# Patient Record
Sex: Male | Born: 1990 | Race: White | Hispanic: No | Smoking: Never smoker
Health system: Southern US, Community
[De-identification: ages and names within clinical notes are randomized; demographics above are authoritative.]

## PROBLEM LIST (undated history)

## (undated) DIAGNOSIS — E669 Obesity, unspecified: Secondary | ICD-10-CM

## (undated) DIAGNOSIS — T7840XA Allergy, unspecified, initial encounter: Secondary | ICD-10-CM

## (undated) DIAGNOSIS — F32A Depression, unspecified: Secondary | ICD-10-CM

## (undated) DIAGNOSIS — F419 Anxiety disorder, unspecified: Secondary | ICD-10-CM

## (undated) HISTORY — PX: TONSILLECTOMY: SUR1361

## (undated) HISTORY — PX: TYMPANOSTOMY TUBE PLACEMENT: SHX32

## (undated) HISTORY — DX: Obesity, unspecified: E66.9

## (undated) HISTORY — DX: Allergy, unspecified, initial encounter: T78.40XA

## (undated) HISTORY — DX: Depression, unspecified: F32.A

## (undated) HISTORY — DX: Anxiety disorder, unspecified: F41.9

---
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
## 2011-03-07 LAB — CBC
RBC: 5.28 MIL/uL (ref 3.80–5.70)
WBC: 7.9 10*3/uL (ref 4.5–13.5)

## 2011-11-16 ENCOUNTER — Emergency Department (HOSPITAL_COMMUNITY)
Admission: EM | Admit: 2011-11-16 | Discharge: 2011-11-17 | Disposition: A | Discharge status: Home / Self Care | Payer: Commercial Managed Care - PPO | Attending: Emergency Medicine | Admitting: Emergency Medicine

## 2011-11-16 DIAGNOSIS — J45901 Unspecified asthma with (acute) exacerbation: Secondary | ICD-10-CM | POA: Insufficient documentation

## 2011-11-17 ENCOUNTER — Encounter: Payer: Self-pay | Admitting: Emergency Medicine

## 2011-11-17 MED ORDER — ALBUTEROL SULFATE HFA 108 (90 BASE) MCG/ACT IN AERS
2.0000 | INHALATION_SPRAY | RESPIRATORY_TRACT | Status: DC | PRN
  Administered 2011-11-17: 2 via RESPIRATORY_TRACT
  Filled 2011-11-17: qty 6.7

## 2011-11-17 MED ORDER — IPRATROPIUM BROMIDE 0.02 % IN SOLN
RESPIRATORY_TRACT | Status: AC
  Administered 2011-11-17: 0.5 mg via RESPIRATORY_TRACT
  Filled 2011-11-17: qty 2.5

## 2011-11-17 MED ORDER — ALBUTEROL SULFATE (5 MG/ML) 0.5% IN NEBU
2.5000 mg | INHALATION_SOLUTION | Freq: Once | RESPIRATORY_TRACT | Status: AC
  Administered 2011-11-17: 2.5 mg via RESPIRATORY_TRACT

## 2011-11-17 MED ORDER — ALBUTEROL SULFATE (5 MG/ML) 0.5% IN NEBU
2.5000 mg | INHALATION_SOLUTION | Freq: Once | RESPIRATORY_TRACT | Status: AC
  Administered 2011-11-17: 2.5 mg via RESPIRATORY_TRACT
  Filled 2011-11-17: qty 1

## 2011-11-17 MED ORDER — IPRATROPIUM BROMIDE 0.02 % IN SOLN
0.5000 mg | Freq: Once | RESPIRATORY_TRACT | Status: DC
  Administered 2011-11-17: 0.5 mg via RESPIRATORY_TRACT

## 2011-11-17 NOTE — ED Notes (Signed)
No new rx given, pt given inhaler to take home. Voiced understanding to keep appt with PCP tomorrow

## 2011-11-17 NOTE — ED Notes (Signed)
Pt states he is feeling better.  

## 2011-11-17 NOTE — ED Provider Notes (Signed)
Medical screening examination/treatment/procedure(s) were performed by non-physician practitioner and as supervising physician I was immediately available for consultation/collaboration.   Kabella Cassidy M Curley Hogen, MD 11/17/11 0822 

## 2011-11-17 NOTE — ED Provider Notes (Signed)
History     CSN: [id number] Arrival date & time: 11/16/2011 11:57 PM   First MD Initiated Contact with Patient 11/17/11 0149      Chief Complaint  Patient presents with  . Asthma    (Consider location/radiation/quality/duration/timing/severity/associated sxs/prior treatment) HPI Comments: 20 year old gentleman with a history of asthma, who ran out of his inhalers, including Provera, and Dalara.  He does have an appointment tomorrow with Dr. Willa Rough to have his prescriptions refilled.  This asthma attack started several days ago.  He's tried rest and hydration without relief.  Exertion makes the wheezing worse  The history is provided by the patient.    Past Medical History  Diagnosis Date  . Asthma     History reviewed. No pertinent past surgical history.  No family history on file.  History  Substance Use Topics  . Smoking status: Never Smoker   . Smokeless tobacco: Not on file  . Alcohol Use: No      Review of Systems  Constitutional: Positive for activity change.  HENT: Negative.   Eyes: Negative.   Respiratory: Positive for chest tightness, shortness of breath and wheezing. Negative for cough.   Cardiovascular: Negative.   Gastrointestinal: Negative.   Genitourinary: Negative.   Musculoskeletal: Negative.   Neurological: Negative for dizziness.  Psychiatric/Behavioral: Negative.     Allergies  Penicillins  Home Medications   Current Outpatient Rx  Name Route Sig Dispense Refill  . ALBUTEROL SULFATE HFA 108 (90 BASE) MCG/ACT IN AERS Inhalation Inhale 2 puffs into the lungs every 6 (six) hours as needed. For shortness of breath     . MOMETASONE FURO-FORMOTEROL FUM 100-5 MCG/ACT IN AERO Inhalation Inhale 2 puffs into the lungs 2 (two) times daily.        BP 131/89  Pulse 113  Temp(Src) 97.6 F (36.4 C) (Oral)  Resp 20  SpO2 95%  Physical Exam  Constitutional: He is oriented to person, place, and time. He appears well-developed and well-nourished.    HENT:  Head: Normocephalic.  Eyes: EOM are normal.  Neck: Normal range of motion.  Cardiovascular: Tachycardia present.   Pulmonary/Chest: Effort normal. No accessory muscle usage. Not tachypneic. No respiratory distress.       I examined patient after he received neb treatment at triage no wheezing no distress   Musculoskeletal: Normal range of motion.  Neurological: He is oriented to person, place, and time.  Skin: Skin is warm and dry.    ED Course  Procedures (including critical care time)  Labs Reviewed - No data to display No results found.   1. Asthma attack     ON my exam after neb treatment no respiratory distress, no accessory muscle use HR 88, sat 98% RA   MDM  Asthma attack         Arman Filter, NP 11/17/11 0205  Arman Filter, NP 11/17/11 0206

## 2011-11-17 NOTE — ED Notes (Signed)
Pt out of asthma meds for 3 days.  C/o sob and wheezing.  Scheduled to see PCP tomorrow to get meds refilled.

## 2012-02-17 ENCOUNTER — Ambulatory Visit (INDEPENDENT_AMBULATORY_CARE_PROVIDER_SITE_OTHER): Payer: Commercial Managed Care - PPO | Admitting: Family Medicine

## 2012-02-17 VITALS — BP 124/82 | HR 89 | Temp 97.6°F | Resp 22 | Ht 75.0 in | Wt 322.0 lb

## 2012-02-17 DIAGNOSIS — F341 Dysthymic disorder: Secondary | ICD-10-CM

## 2012-02-17 DIAGNOSIS — F418 Other specified anxiety disorders: Secondary | ICD-10-CM

## 2012-02-17 MED ORDER — CITALOPRAM HYDROBROMIDE 20 MG PO TABS: 10.0000 mg | ORAL_TABLET | Freq: Every day | ORAL | Status: AC

## 2012-02-17 NOTE — Progress Notes (Signed)
[facility]:  Office Visit  Chief Complaint:  Chief Complaint  Patient presents with  . Anxiety    since Monday  . Panic Attack    since Monday  . Dizziness    lightheaded every evenings  . Migraine    since Sunday    HPI: Russell [name] is a 21 y.o. male who complains of  5 day history of anxiety, due to grandmothers death. He has HA, doesn't feel motivated. Sleeps 8 hours a day. Slight changes in sleep but not significant. No changes in appetite, mania, auditory or visual hallucination, suicidal thought or ideation, homicidal thoughts or ideation.   Past Medical History  Diagnosis Date  . Asthma   . Obesity    History reviewed. No pertinent past surgical history. History   Social History  . Marital Status: Single    Spouse Name: N/A    Number of Children: N/A  . Years of Education: N/A   Social History Main Topics  . Smoking status: Never Smoker   . Smokeless tobacco: None  . Alcohol Use: No  . Drug Use: No  . Sexually Active: None   Other Topics Concern  . None   Social History Narrative  . None   Family History  Problem Relation Age of Onset  . Anxiety disorder Mother   . Stroke Father   . Heart disease Maternal Grandfather    Allergies  Allergen Reactions  . Penicillins    Prior to Admission medications   Medication Sig Start Date End Date Taking? Authorizing Provider  albuterol (PROVENTIL HFA;VENTOLIN HFA) 108 (90 BASE) MCG/ACT inhaler Inhale 2 puffs into the lungs every 6 (six) hours as needed. For shortness of breath    Yes Historical Provider, MD  mometasone-formoterol (DULERA) 100-5 MCG/ACT AERO Inhale 2 puffs into the lungs 2 (two) times daily.     Yes Historical Provider, MD     ROS: The patient denies fevers, chills, night sweats, unintentional weight loss, chest pain, palpitations, wheezing, dyspnea on exertion, nausea, vomiting, abdominal pain, dysuria, hematuria, melena, numbness, weakness, or tingling. + anxiety,  depression  All other systems have been reviewed and were otherwise negative with the exception of those mentioned in the HPI and as above.    PHYSICAL EXAM: Filed Vitals:   02/17/12 1738  BP: 124/82  Pulse: 89  Temp: 97.6 F (36.4 C)  Resp: 22   Filed Vitals:   02/17/12 1738  Height: 6\' 3"  (1.905 m)  Weight: 322 lb (146.058 kg)   Body mass index is 40.25 kg/(m^2).  General: Alert, no acute distress, morbidly obese HEENT:  Normocephalic, atraumatic, oropharynx patent.  Cardiovascular:  Regular rate and rhythm, no rubs murmurs or gallops.  No Carotid bruits, radial pulse intact. No pedal edema.  Respiratory: Clear to auscultation bilaterally.  No wheezes, rales, or rhonchi.  No cyanosis, no use of accessory musculature GI: No organomegaly, abdomen is soft and non-tender, positive bowel sounds.  No masses. Skin: No rashes. Neurologic: Facial musculature symmetric. Psychiatric: Patient is appropriate throughout our interaction. Lymphatic: No cervical lymphadenopathy Musculoskeletal: Gait intact.   ASSESSMENT/PLAN: Encounter Diagnosis  Name Primary?  Marland Kitchen Anxiety associated with depression Yes   Zung Anxiety 49 Zung Depression 50 Patient was given an option of going to therapy and getting antianxiety medication but he opted to just take anti-depressant.  Recommend exercise, improve diet. Encourage him to get his sleep apnea study as scheduled.   F/u in 1 month   Knut Rondinelli  PHUONG, DO 02/17/2012 7:23 PM

## 2012-07-11 ENCOUNTER — Ambulatory Visit (INDEPENDENT_AMBULATORY_CARE_PROVIDER_SITE_OTHER): Payer: Commercial Managed Care - PPO | Admitting: Physician Assistant

## 2012-07-11 VITALS — BP 119/71 | HR 78 | Temp 98.2°F | Resp 18 | Ht 75.5 in | Wt 306.0 lb

## 2012-07-11 DIAGNOSIS — T6391XA Toxic effect of contact with unspecified venomous animal, accidental (unintentional), initial encounter: Secondary | ICD-10-CM

## 2012-07-11 DIAGNOSIS — T7840XA Allergy, unspecified, initial encounter: Secondary | ICD-10-CM

## 2012-07-11 DIAGNOSIS — J45909 Unspecified asthma, uncomplicated: Secondary | ICD-10-CM

## 2012-07-11 DIAGNOSIS — T63441A Toxic effect of venom of bees, accidental (unintentional), initial encounter: Secondary | ICD-10-CM

## 2012-07-11 MED ORDER — EPINEPHRINE 0.3 MG/0.3ML IJ DEVI: 0.3000 mg | Freq: Once | INTRAMUSCULAR | Status: DC

## 2012-07-11 MED ORDER — ALBUTEROL SULFATE HFA 108 (90 BASE) MCG/ACT IN AERS: 2.0000 | INHALATION_SPRAY | Freq: Four times a day (QID) | RESPIRATORY_TRACT | Status: DC | PRN

## 2012-07-11 MED ORDER — MOMETASONE FURO-FORMOTEROL FUM 100-5 MCG/ACT IN AERO: 2.0000 | INHALATION_SPRAY | Freq: Two times a day (BID) | RESPIRATORY_TRACT | Status: DC

## 2012-07-11 MED ORDER — PREDNISONE 20 MG PO TABS: ORAL_TABLET | ORAL | Status: AC

## 2012-07-11 NOTE — Progress Notes (Signed)
  Subjective:    Patient ID: Russell [name], male    DOB: 1991/02/10, 21 y.o.   MRN: [id number]  HPI Patient presents s/p yellow jacket sting at 7:00 this morning. The sting occurred on the dorsum of his right hand. He complains of a tickle in his throat but denies lip/tongue swelling or trouble breathing. No rash, hives, or pruritis. No history of anaphylactic reaction but he does have asthma and seasonal allergies. No known history of severe reaction to bee stings.  Has not taken any medications yet.  Has local swelling of his right hand with slight warmth and tenderness.     Review of Systems  All other systems reviewed and are negative.       Objective:   Physical Exam  Constitutional: He is oriented to person, place, and time. He appears well-developed and well-nourished.  HENT:  Head: Normocephalic and atraumatic.  Right Ear: External ear normal.  Left Ear: External ear normal.  Mouth/Throat: Oropharynx is clear and moist. No oropharyngeal exudate.  Eyes: Conjunctivae are normal.  Neck: Normal range of motion.  Cardiovascular: Normal rate, regular rhythm and normal heart sounds.   Pulmonary/Chest: Effort normal and breath sounds normal.  Musculoskeletal: Normal range of motion.       Right hand: He exhibits tenderness and swelling. normal sensation noted. Normal strength noted.  Neurological: He is alert and oriented to person, place, and time.  Skin: No rash noted.  Psychiatric: He has a normal mood and affect. His behavior is normal. Judgment and thought content normal.     Zantac 150 mg, Zyrtec 10 mg, and Benadryl 50 mg given.       Assessment & Plan:   1. Allergic reaction  predniSONE (DELTASONE) 20 MG tablet  2. Bee sting    3. Asthma  mometasone-formoterol (DULERA) 100-5 MCG/ACT AERO, albuterol (PROVENTIL HFA;VENTOLIN HFA) 108 (90 BASE) MCG/ACT inhaler   Reassurance provided. Will treat with Prednisone taper. Inhalers refilled and Epi-pen prescribed.   RTC  precautions given.  Recommend ice for local swelling.

## 2012-09-29 ENCOUNTER — Ambulatory Visit (INDEPENDENT_AMBULATORY_CARE_PROVIDER_SITE_OTHER): Payer: Commercial Managed Care - PPO | Admitting: Family Medicine

## 2012-09-29 VITALS — BP 120/88 | HR 88 | Temp 98.2°F | Resp 18 | Ht 75.75 in | Wt 303.8 lb

## 2012-09-29 DIAGNOSIS — H6980 Other specified disorders of Eustachian tube, unspecified ear: Secondary | ICD-10-CM

## 2012-09-29 DIAGNOSIS — G4730 Sleep apnea, unspecified: Secondary | ICD-10-CM

## 2012-09-29 DIAGNOSIS — J45909 Unspecified asthma, uncomplicated: Secondary | ICD-10-CM

## 2012-09-29 MED ORDER — FLUTICASONE PROPIONATE 50 MCG/ACT NA SUSP: 2.0000 | Freq: Every day | NASAL | Status: DC

## 2012-09-29 MED ORDER — CETIRIZINE HCL 10 MG PO TABS: 10.0000 mg | ORAL_TABLET | Freq: Every day | ORAL | Status: DC

## 2012-09-29 MED ORDER — MOMETASONE FURO-FORMOTEROL FUM 100-5 MCG/ACT IN AERO: 2.0000 | INHALATION_SPRAY | Freq: Two times a day (BID) | RESPIRATORY_TRACT | Status: DC

## 2012-09-29 NOTE — Patient Instructions (Addendum)
Try using debrox ear wax softener drops several days each month to prevent build up of ear wax  Barotitis Media Barotitis media is soreness (inflammation) of the area behind the eardrum (middle ear). This occurs when the auditory tube (Eustachian tube) leading from the back of the throat to the eardrum is blocked. When it is blocked air cannot move in and out of the middle ear to equalize pressure changes. These pressure changes come from changes in altitude when:  Flying.  Driving in the mountains.  Diving. Problems are more likely to occur with pressure changes during times when you are congested as from:  Hay fever.  Upper respiratory infection.  A cold. Damage or hearing loss (barotrauma) caused by this may be permanent. HOME CARE INSTRUCTIONS   Use medicines as recommended by your caregiver. Over the counter medicines will help unblock the canal and can help during times of air travel.  Do not put anything into your ears to clean or unplug them. Eardrops will not be helpful.  Do not swim, dive, or fly until your caregiver says it is all right to do so. If these activities are necessary, chewing gum with frequent swallowing may help. It is also helpful to hold your nose and gently blow to pop your ears for equalizing pressure changes. This forces air into the Eustachian tube.  For little ones with problems, give your baby a bottle of water or juice during periods when pressure changes would be anticipated such as during take offs and landings associated with air travel.  Only take over-the-counter or prescription medicines for pain, discomfort, or fever as directed by your caregiver.  A decongestant may be helpful in de-congesting the middle ear and make pressure equalization easier. This can be even more effective if the drops (spray) are delivered with the head lying over the edge of a bed with the head tilted toward the ear on the affected side.  If your caregiver has given you  a follow-up appointment, it is very important to keep that appointment. Not keeping the appointment could result in a chronic or permanent injury, pain, hearing loss and disability. If there is any problem keeping the appointment, you must call back to this facility for assistance. SEEK IMMEDIATE MEDICAL CARE IF:   You develop a severe headache, dizziness, severe ear pain, or bloody or pus-like drainage from your ears.  An oral temperature above 102 F (38.9 C) develops.  Your problems do not improve or become worse. MAKE SURE YOU:   Understand these instructions.  Will watch your condition.  Will get help right away if you are not doing well or get worse. Document Released: 12/02/2000 Document Revised: 02/27/2012 Document Reviewed: 07/10/2008 [facility] Patient Information 2013 [place], [place]. Sleep Apnea  Sleep apnea is a sleep disorder characterized by abnormal pauses in breathing while you sleep. When your breathing pauses, the level of oxygen in your blood decreases. This causes you to move out of deep sleep and into light sleep. As a result, your quality of sleep is poor, and the system that carries your blood throughout your body (cardiovascular system) experiences stress. If sleep apnea remains untreated, the following conditions can develop:  High blood pressure (hypertension).  Coronary artery disease.  Inability to achieve or maintain an erection (impotence).  Impairment of your thought process (cognitive dysfunction). There are three types of sleep apnea: 1. Obstructive sleep apnea Pauses in breathing during sleep because of a blocked airway. 2. Central sleep apnea Pauses in breathing  during sleep because the area of the brain that controls your breathing does not send the correct signals to the muscles that control breathing. 3. Mixed sleep apnea A combination of both obstructive and central sleep apnea. RISK FACTORS The following risk factors can increase your risk of  developing sleep apnea:  Being overweight.  Smoking.  Having narrow passages in your nose and throat.  Being of older age.  Being male.  Alcohol use.  Sedative and tranquilizer use.  Ethnicity. Among individuals younger than 35 years, African Americans are at increased risk of sleep apnea. SYMPTOMS   Difficulty staying asleep.  Daytime sleepiness and fatigue.  Loss of energy.  Irritability.  Loud, heavy snoring.  Morning headaches.  Trouble concentrating.  Forgetfulness.  Decreased interest in sex. DIAGNOSIS  In order to diagnose sleep apnea, your caregiver will perform a physical examination. Your caregiver may suggest that you take a home sleep test. Your caregiver may also recommend that you spend the night in a sleep lab. In the sleep lab, several monitors record information about your heart, lungs, and brain while you sleep. Your leg and arm movements and blood oxygen level are also recorded. TREATMENT The following actions may help to resolve mild sleep apnea:  Sleeping on your side.   Using a decongestant if you have nasal congestion.   Avoiding the use of depressants, including alcohol, sedatives, and narcotics.   Losing weight and modifying your diet if you are overweight. There also are devices and treatments to help open your airway:  Oral appliances. These are custom-made mouthpieces that shift your lower jaw forward and slightly open your bite. This opens your airway.  Devices that create positive airway pressure. This positive pressure "splints" your airway open to help you breathe better during sleep. The following devices create positive airway pressure:  Continuous positive airway pressure (CPAP) device. The CPAP device creates a continuous level of air pressure with an air pump. The air is delivered to your airway through a mask while you sleep. This continuous pressure keeps your airway open.  Nasal expiratory positive airway pressure  (EPAP) device. The EPAP device creates positive air pressure as you exhale. The device consists of single-use valves, which are inserted into each nostril and held in place by adhesive. The valves create very little resistance when you inhale but create much more resistance when you exhale. That increased resistance creates the positive airway pressure. This positive pressure while you exhale keeps your airway open, making it easier to breath when you inhale again.  Bilevel positive airway pressure (BPAP) device. The BPAP device is used mainly in patients with central sleep apnea. This device is similar to the CPAP device because it also uses an air pump to deliver continuous air pressure through a mask. However, with the BPAP machine, the pressure is set at two different levels. The pressure when you exhale is lower than the pressure when you inhale.  Surgery. Typically, surgery is only done if you cannot comply with less invasive treatments or if the less invasive treatments do not improve your condition. Surgery involves removing excess tissue in your airway to create a wider passage way. Document Released: 11/25/2002 Document Revised: 06/05/2012 Document Reviewed: 04/12/2012 [facility] Patient Information 2013 [place], [place].

## 2013-01-19 ENCOUNTER — Emergency Department (HOSPITAL_COMMUNITY)
Admission: EM | Admit: 2013-01-19 | Discharge: 2013-01-19 | Disposition: A | Discharge status: Home / Self Care | Payer: Commercial Managed Care - PPO | Attending: Emergency Medicine | Admitting: Emergency Medicine

## 2013-01-19 ENCOUNTER — Encounter (HOSPITAL_COMMUNITY): Payer: Self-pay | Admitting: Adult Health

## 2013-01-19 DIAGNOSIS — E669 Obesity, unspecified: Secondary | ICD-10-CM | POA: Insufficient documentation

## 2013-01-19 DIAGNOSIS — J3489 Other specified disorders of nose and nasal sinuses: Secondary | ICD-10-CM | POA: Insufficient documentation

## 2013-01-19 DIAGNOSIS — J45909 Unspecified asthma, uncomplicated: Secondary | ICD-10-CM | POA: Insufficient documentation

## 2013-01-19 DIAGNOSIS — H6690 Otitis media, unspecified, unspecified ear: Secondary | ICD-10-CM

## 2013-01-19 DIAGNOSIS — IMO0002 Reserved for concepts with insufficient information to code with codable children: Secondary | ICD-10-CM | POA: Insufficient documentation

## 2013-01-19 DIAGNOSIS — Z79899 Other long term (current) drug therapy: Secondary | ICD-10-CM | POA: Insufficient documentation

## 2013-01-19 MED ORDER — CEFDINIR 300 MG PO CAPS: 300.0000 mg | ORAL_CAPSULE | Freq: Two times a day (BID) | ORAL | Status: DC

## 2013-01-19 NOTE — ED Notes (Signed)
Pt c/o right ear pain onset earlier today

## 2013-01-19 NOTE — ED Notes (Signed)
Presents with right ear ache that began today. Right ear red.

## 2013-01-19 NOTE — ED Provider Notes (Signed)
History   This chart was scribed for Russell Emery PA-C a non-physician practitioner working with Ward Givens, MD by Lewanda Rife, ED Scribe. This patient was seen in room TR04C/TR04C and the patient's care was started at 7:50pm.    CSN: [id number]  Arrival date & time 01/19/13  1928   First MD Initiated Contact with Patient 01/19/13 1937      Chief Complaint  Patient presents with  . Otalgia    (Consider location/radiation/quality/duration/timing/severity/associated sxs/prior treatment) HPI OLSON LUCARELLI is a 22 y.o. male who presents to the Emergency Department complaining of constant moderate right ear pain onset this morning. Pt denies shortness of breath, fever, cough, and sore throat. Pt additionally reports congestion. Pt reports symptoms are aggravated by nothing and relieved by nothing. Pt denies taking any medications at home to treat pain. Pt reports history of recurrent ear infections.   Past Medical History  Diagnosis Date  . Asthma   . Obesity     History reviewed. No pertinent past surgical history.  Family History  Problem Relation Age of Onset  . Anxiety disorder Mother   . Stroke Father   . Heart disease Maternal Grandfather     History  Substance Use Topics  . Smoking status: Never Smoker   . Smokeless tobacco: Not on file  . Alcohol Use: No      Review of Systems  Constitutional: Negative.  Negative for fever.  HENT: Positive for ear pain and congestion. Negative for sore throat and ear discharge.   Respiratory: Negative.  Negative for shortness of breath.   Cardiovascular: Negative.   Gastrointestinal: Negative.   Musculoskeletal: Negative.   Skin: Negative.   Neurological: Negative.   Hematological: Negative.   Psychiatric/Behavioral: Negative.   All other systems reviewed and are negative.  A complete 10 system review of systems was obtained and all systems are negative except as noted in the HPI and PMH.     Allergies   Penicillins  Home Medications   Current Outpatient Rx  Name  Route  Sig  Dispense  Refill  . ALBUTEROL SULFATE HFA 108 (90 BASE) MCG/ACT IN AERS   Inhalation   Inhale 2 puffs into the lungs every 6 (six) hours as needed. For shortness of breath   1 Inhaler   5   . CETIRIZINE HCL 10 MG PO TABS   Oral   Take 1 tablet (10 mg total) by mouth daily.   30 tablet   11   . EPINEPHRINE 0.3 MG/0.3ML IJ DEVI   Intramuscular   Inject 0.3 mLs (0.3 mg total) into the muscle once.   1 Device   0   . FLUTICASONE PROPIONATE 50 MCG/ACT NA SUSP   Nasal   Place 2 sprays into the nose daily.   16 g   6   . MOMETASONE FURO-FORMOTEROL FUM 100-5 MCG/ACT IN AERO   Inhalation   Inhale 2 puffs into the lungs 2 (two) times daily.   1 Inhaler   5     There were no vitals taken for this visit.  Physical Exam  Nursing note and vitals reviewed. Constitutional: He is oriented to person, place, and time. He appears well-developed and well-nourished. No distress.  HENT:  Head: Normocephalic.  Right Ear: External ear normal. Tympanic membrane is bulging (mildly ).  Left Ear: Tympanic membrane, external ear and ear canal normal.       Right TM reduced light reflex   Eyes: Conjunctivae normal and EOM are  normal.  Neck: Neck supple.  Cardiovascular: Normal rate.   Pulmonary/Chest: Effort normal and breath sounds normal. No stridor. No respiratory distress. He has no wheezes. He has no rales.  Musculoskeletal: Normal range of motion.  Neurological: He is alert and oriented to person, place, and time.  Skin: Skin is warm and dry.  Psychiatric: He has a normal mood and affect. His behavior is normal.    ED Course  Procedures (including critical care time)  Labs Reviewed - No data to display No results found.   1. Otitis media       MDM   Right otitis media  Pt verbalized understanding and agrees with care plan. Outpatient follow-up and return precautions given.    New  Prescriptions   CEFDINIR (OMNICEF) 300 MG CAPSULE    Take 1 capsule (300 mg total) by mouth 2 (two) times daily.    I personally performed the services described in this documentation, which was scribed in my presence. The recorded information has been reviewed and is accurate.   Russell Emery, PA-C 01/25/13 [phone]

## 2013-01-21 ENCOUNTER — Ambulatory Visit (INDEPENDENT_AMBULATORY_CARE_PROVIDER_SITE_OTHER): Payer: Commercial Managed Care - PPO | Admitting: Physician Assistant

## 2013-01-21 VITALS — BP 116/68 | HR 90 | Temp 98.5°F | Resp 20 | Ht 75.75 in | Wt 316.8 lb

## 2013-01-21 DIAGNOSIS — H60399 Other infective otitis externa, unspecified ear: Secondary | ICD-10-CM

## 2013-01-21 DIAGNOSIS — H6990 Unspecified Eustachian tube disorder, unspecified ear: Secondary | ICD-10-CM

## 2013-01-21 DIAGNOSIS — H6980 Other specified disorders of Eustachian tube, unspecified ear: Secondary | ICD-10-CM

## 2013-01-21 DIAGNOSIS — H6090 Unspecified otitis externa, unspecified ear: Secondary | ICD-10-CM

## 2013-01-21 MED ORDER — CIPROFLOXACIN-DEXAMETHASONE 0.3-0.1 % OT SUSP: 4.0000 [drp] | Freq: Two times a day (BID) | OTIC | Status: DC

## 2013-01-21 MED ORDER — FLUTICASONE PROPIONATE 50 MCG/ACT NA SUSP: 2.0000 | Freq: Every day | NASAL | Status: DC

## 2013-01-21 MED ORDER — CETIRIZINE HCL 10 MG PO TABS: 10.0000 mg | ORAL_TABLET | Freq: Every day | ORAL | Status: DC

## 2013-01-21 NOTE — Progress Notes (Signed)
Subjective:    Patient ID: Russell [name], male    DOB: 1991-08-13, 21 y.o.   MRN: [id number]  HPI   Russell [name] is a 22 yr old male here complaining of bilateral ear pain over the last 2-3 days.  He was seen in the ED on 01/19/13, diagnosed with otitis media and started on cefdinir x 10 days.  Pt states today his ears are still hurting, now also having some pain in his jaw.   He is taking the abx as directed.  Taking 200-400mg  of ibuprofen twice daily with helps with the pain.  No difficulty swallowing.  States both ears continue to hurt.  Some runny nose and headache.  Sore throat in the mornings.  Denies fever, chills, nausea, vomiting.  History of asthma controlled with Dulera and albuterol.  Pt is not taking Flonase or Zyrtec which were previously prescribed for him.  Pt has been seen several times in the last 4 months for ear complaints, has never seen ENT.  States that he has had jaw pain in the past, was related to sleeping on his hands.  Stopped doing that and pain went away.  Denies clenching or grinding.  Pt does states that he "washes out" his ears daily in the shower with soap and water.  Denies use of cotton swabs.  Denies any drainage from the ears.    Review of Systems  Constitutional: Negative for fever and chills.  HENT: Positive for ear pain (bilat), sore throat, rhinorrhea and postnasal drip. Negative for hearing loss, facial swelling, neck pain, neck stiffness and ear discharge.   Respiratory: Positive for cough (asthma). Negative for shortness of breath and wheezing.   Cardiovascular: Negative.   Gastrointestinal: Negative.   Musculoskeletal: Negative.   Skin: Negative.   Neurological: Positive for headaches.       Objective:   Physical Exam  Vitals reviewed. Constitutional: He is oriented to person, place, and time. He appears well-developed and well-nourished. No distress.  HENT:  Head: Normocephalic and atraumatic.  Right Ear: Ear canal normal. Tympanic membrane is  erythematous and bulging.  Left Ear: Tympanic membrane normal.  Nose: Mucosal edema present.  Mouth/Throat: Uvula is midline, oropharynx is clear and moist and mucous membranes are normal.       Left tragus TTP; left canal erythematous with some maceration and debris present; no edema  Jaw nonTTP bilaterally; full ROM; no popping, clicking  Neck: Neck supple.  Cardiovascular: Normal rate, regular rhythm and normal heart sounds.   Pulmonary/Chest: Effort normal. He has no decreased breath sounds. He has wheezes (few, scattered, expiratory). He has no rhonchi. He has no rales.  Lymphadenopathy:    He has no cervical adenopathy.  Neurological: He is alert and oriented to person, place, and time.  Skin: Skin is warm and dry.  Psychiatric: He has a normal mood and affect. His behavior is normal.     Filed Vitals:   01/21/13 1546  BP: 116/68  Pulse: 90  Temp: 98.5 F (36.9 C)  Resp: 20        Assessment & Plan:   1. Otitis externa  ciprofloxacin-dexamethasone (CIPRODEX) otic suspension  2. Eustachian tube dysfunction  cetirizine (ZYRTEC) 10 MG tablet, fluticasone (FLONASE) 50 MCG/ACT nasal spray    Russell [name] is a 22 yr old male here with otalgia.  Diagnosed with OM at ED two days ago.  Currently taking cefdinir BID.  Suspect otitis externa of the left ear as well given erythema and tenderness  on palpation of the tragus.  Will try Ciprodex drops x 7 days.  Encouraged pt to keep ears dry as much as possible.  Discouraged him from washing out his ears in the shower.  Instructed him to finish the full course of oral antibiotics as prescribed.  Also encouraged him to restart both Flonase and Zyrtec as some of his symptoms are attributable to allergic rhinitis.  Also encouraged compliance with asthma regimen.  Ibuprofen for pain relief.  Pt expressed understanding.  Discussed RTC precautions.  If pt is not improved by the end of his antibiotic course, ENT referral may be the next step as he  has had ear symptoms since October.

## 2013-01-21 NOTE — Progress Notes (Signed)
Subjective:    Patient ID: Russell [name], male    DOB: [birth year], 21 y.o.   MRN: [id number] Chief Complaint  Patient presents with  . Cerumen Impaction    x 3 mth    HPI  Can't hear out of ear.   Past Medical History  Diagnosis Date  . Asthma   . Obesity    Current Outpatient Prescriptions on File Prior to Visit  Medication Sig Dispense Refill  . albuterol (PROVENTIL HFA;VENTOLIN HFA) 108 (90 BASE) MCG/ACT inhaler Inhale 2 puffs into the lungs every 6 (six) hours as needed. For shortness of breath  1 Inhaler  5  . EPINEPHrine (EPIPEN) 0.3 mg/0.3 mL DEVI Inject 0.3 mLs (0.3 mg total) into the muscle once.  1 Device  0  . mometasone-formoterol (DULERA) 100-5 MCG/ACT AERO Inhale 2 puffs into the lungs 2 (two) times daily.  1 Inhaler  5  . cetirizine (ZYRTEC) 10 MG tablet Take 1 tablet (10 mg total) by mouth daily.  30 tablet  11  . fluticasone (FLONASE) 50 MCG/ACT nasal spray Place 2 sprays into the nose daily.  16 g  6   Allergies  Allergen Reactions  . Penicillins     Unknown     Review of Systems  Constitutional: Positive for diaphoresis and fatigue. Negative for fever, chills, activity change, appetite change and unexpected weight change.  HENT: Positive for hearing loss, ear pain, congestion, rhinorrhea, sneezing and postnasal drip. Negative for sore throat, mouth sores, neck pain, neck stiffness and sinus pressure.   Respiratory: Positive for cough and wheezing. Negative for shortness of breath.   Cardiovascular: Negative for chest pain.  Gastrointestinal: Negative for nausea, vomiting, abdominal pain, diarrhea and constipation.  Genitourinary: Negative for dysuria.  Musculoskeletal: Negative for myalgias and arthralgias.  Skin: Negative for rash.  Neurological: Positive for headaches. Negative for syncope.  Hematological: Negative for adenopathy.  Psychiatric/Behavioral: Positive for sleep disturbance.      BP 120/88  Pulse 88  Temp 98.2 F (36.8 C) (Oral)   Resp 18  Ht 6' 3.75" (1.924 m)  Wt 303 lb 12.8 oz (137.803 kg)  BMI 37.22 kg/m2  SpO2 99% Objective:   Physical Exam  Constitutional: He is oriented to person, place, and time. He appears well-developed and well-nourished. No distress.  HENT:  Head: Normocephalic and atraumatic.  Right Ear: External ear and ear canal normal. Tympanic membrane is retracted. A middle ear effusion is present.  Left Ear: External ear and ear canal normal. Tympanic membrane is retracted. A middle ear effusion is present.  Nose: Mucosal edema and rhinorrhea present. Right sinus exhibits no maxillary sinus tenderness and no frontal sinus tenderness. Left sinus exhibits no maxillary sinus tenderness and no frontal sinus tenderness.  Mouth/Throat: Uvula is midline and mucous membranes are normal. Posterior oropharyngeal erythema present. No oropharyngeal exudate or posterior oropharyngeal edema.  Eyes: Conjunctivae normal are normal. Right eye exhibits no discharge. Left eye exhibits no discharge. No scleral icterus.  Neck: Normal range of motion. Neck supple. No thyromegaly present.  Cardiovascular: Normal rate, regular rhythm, normal heart sounds and intact distal pulses.   Pulmonary/Chest: Effort normal and breath sounds normal. No respiratory distress.  Lymphadenopathy:       Head (right side): No submandibular, no preauricular and no posterior auricular adenopathy present.       Head (left side): No submandibular, no preauricular and no posterior auricular adenopathy present.    He has no cervical adenopathy.  Right: No supraclavicular adenopathy present.       Left: No supraclavicular adenopathy present.  Neurological: He is alert and oriented to person, place, and time.  Skin: Skin is warm and dry. He is not diaphoretic. No erythema.  Psychiatric: He has a normal mood and affect. His behavior is normal.      Assessment & Plan:   1. Eustachian tube dysfunction  fluticasone (FLONASE) 50 MCG/ACT nasal  spray, cetirizine (ZYRTEC) 10 MG tablet  2. Asthma  mometasone-formoterol (DULERA) 100-5 MCG/ACT AERO  3. Sleep apnea  Ambulatory referral to Sleep Studies  Try using debrox ear wax softener drops several days each month to prevent build up of ear wax. Meds ordered this encounter  Medications  . fluticasone (FLONASE) 50 MCG/ACT nasal spray    Sig: Place 2 sprays into the nose daily.    Dispense:  16 g    Refill:  6  . mometasone-formoterol (DULERA) 100-5 MCG/ACT AERO    Sig: Inhale 2 puffs into the lungs 2 (two) times daily.    Dispense:  1 Inhaler    Refill:  5  . cetirizine (ZYRTEC) 10 MG tablet    Sig: Take 1 tablet (10 mg total) by mouth daily.    Dispense:  30 tablet    Refill:  11

## 2013-01-21 NOTE — Patient Instructions (Addendum)
Begin using the Ciprodex drops in your left ear as directed for 7 days.  Finish the oral antibiotics that you were given at the ED.  Let's try 600mg  ibuprofen every 8 hours (with food) as needed for pain.  If at the end of your antibiotic course you are still having ear pain or jaw pain, let us know.  Certainly, let us know if you feel like things are worsening before then.   Keep the ear dry as much as possible.  Restart both Flonase and Zyrtec as these will help your symptoms.   Otitis Externa Otitis externa is a bacterial or fungal infection of the outer ear canal. This is the area from the eardrum to the outside of the ear. Otitis externa is sometimes called "swimmer's ear." CAUSES  Possible causes of infection include:  Swimming in dirty water.  Moisture remaining in the ear after swimming or bathing.  Mild injury (trauma) to the ear.  Objects stuck in the ear (foreign body).  Cuts or scrapes (abrasions) on the outside of the ear. SYMPTOMS  The first symptom of infection is often itching in the ear canal. Later signs and symptoms may include swelling and redness of the ear canal, ear pain, and yellowish-white fluid (pus) coming from the ear. The ear pain may be worse when pulling on the earlobe. DIAGNOSIS  Your caregiver will perform a physical exam. A sample of fluid may be taken from the ear and examined for bacteria or fungi. TREATMENT  Antibiotic ear drops are often given for 10 to 14 days. Treatment may also include pain medicine or corticosteroids to reduce itching and swelling. PREVENTION   Keep your ear dry. Use the corner of a towel to absorb water out of the ear canal after swimming or bathing.  Avoid scratching or putting objects inside your ear. This can damage the ear canal or remove the protective wax that lines the canal. This makes it easier for bacteria and fungi to grow.  Avoid swimming in lakes, polluted water, or poorly chlorinated pools.  You may use ear drops  made of rubbing alcohol and vinegar after swimming. Combine equal parts of white vinegar and alcohol in a bottle. Put 3 or 4 drops into each ear after swimming. HOME CARE INSTRUCTIONS   Apply antibiotic ear drops to the ear canal as prescribed by your caregiver.  Only take over-the-counter or prescription medicines for pain, discomfort, or fever as directed by your caregiver.  If you have diabetes, follow any additional treatment instructions from your caregiver.  Keep all follow-up appointments as directed by your caregiver. SEEK MEDICAL CARE IF:   You have a fever.  Your ear is still red, swollen, painful, or draining pus after 3 days.  Your redness, swelling, or pain gets worse.  You have a severe headache.  You have redness, swelling, pain, or tenderness in the area behind your ear. MAKE SURE YOU:   Understand these instructions.  Will watch your condition.  Will get help right away if you are not doing well or get worse. Document Released: 12/05/2005 Document Revised: 02/27/2012 Document Reviewed: 12/22/2011 [facility] Patient Information 2013 [place], [place].

## 2013-01-26 NOTE — ED Provider Notes (Signed)
Medical screening examination/treatment/procedure(s) were performed by non-physician practitioner and as supervising physician I was immediately available for consultation/collaboration. Devoria Albe, MD, FACEP   Ward Givens, MD 01/26/13 [phone]

## 2013-05-07 ENCOUNTER — Telehealth: Payer: Self-pay

## 2013-05-07 NOTE — Telephone Encounter (Signed)
We have not seen him for his foot. He can come in for this here, or he can call ortho or podiatry and self schedule an appt, called him left message to advise.

## 2013-05-07 NOTE — Telephone Encounter (Signed)
Patient would like referral to foot doctor if possible [phone]

## 2013-06-24 ENCOUNTER — Ambulatory Visit (INDEPENDENT_AMBULATORY_CARE_PROVIDER_SITE_OTHER): Payer: Commercial Managed Care - PPO | Admitting: Physician Assistant

## 2013-06-24 VITALS — BP 130/80 | HR 74 | Temp 97.9°F | Resp 16 | Ht 75.5 in | Wt 315.0 lb

## 2013-06-24 DIAGNOSIS — H60391 Other infective otitis externa, right ear: Secondary | ICD-10-CM

## 2013-06-24 DIAGNOSIS — M549 Dorsalgia, unspecified: Secondary | ICD-10-CM

## 2013-06-24 DIAGNOSIS — H60399 Other infective otitis externa, unspecified ear: Secondary | ICD-10-CM

## 2013-06-24 MED ORDER — MELOXICAM 15 MG PO TABS
15.0000 mg | ORAL_TABLET | Freq: Every day | ORAL | Status: DC
Start: 2013-06-24 — End: 2013-09-24

## 2013-06-24 MED ORDER — CIPROFLOXACIN-DEXAMETHASONE 0.3-0.1 % OT SUSP: 4.0000 [drp] | Freq: Two times a day (BID) | OTIC | Status: DC

## 2013-06-24 NOTE — Progress Notes (Signed)
  Subjective:    Patient ID: Russell [name], male    DOB: 23-Jul-1991, 21 y.o.   MRN: [id number]  HPI 22 year old male presents with several complaints.   #1) Right ear pain x 1 day. Symptoms started yesterday and have progressively worsened. Was treated for an ear infection in 01/2013. Does admit this feels similarly to that.  Had a significant hx of ear infections as a child and has had several as an adult.  Does have slight nasal congestion, rhinorrhea, and dry cough secondary to allergies. Is not taking any medications for allergic rhinitis.  Denies fever, chills, nausea, vomiting, sinus pain, dizziness, or ear drainage.    #2)  Lower back pain x 2-3 months. 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  He states it is a dull, aching pain that gradually worsens throughout the day.  No acute trauma or injury. Does not have any hx of injuries or surgeries to his back.  Denies paresthesias, numbness/tingling in his legs, pain to his legs, weakness, or saddle anesthesia.  Has taken Goody's powder which does help relieve pain temporarily.  He does think that his being overweight has worsened this pain, and is interested in trying to start exercising again.      Review of Systems  Constitutional: Negative for fever and chills.  HENT: Positive for ear pain (right side), rhinorrhea and postnasal drip. Negative for congestion and sore throat.   Respiratory: Negative for cough.   Gastrointestinal: Negative for nausea and vomiting.  Musculoskeletal: Positive for back pain. Negative for gait problem.  Skin: Negative for rash.  Neurological: Negative for headaches.       Objective:   Physical Exam  Constitutional: He is oriented to person, place, and time. He appears well-developed and well-nourished.  HENT:  Head: Normocephalic and atraumatic.  Right Ear: Hearing, tympanic membrane and ear canal normal. There is drainage and tenderness (erythematous canal). Tympanic  membrane is not perforated and not erythematous.  Left Ear: Hearing, tympanic membrane, external ear and ear canal normal.  Eyes: Conjunctivae are normal.  Neck: Normal range of motion.  Cardiovascular: Normal rate, regular rhythm and normal heart sounds.   Pulmonary/Chest: Effort normal and breath sounds normal.  Neurological: He is alert and oriented to person, place, and time.  Psychiatric: He has a normal mood and affect. His behavior is normal. Judgment and thought content normal.          Assessment & Plan:  1. Otitis, externa, infective, right - Plan: ciprofloxacin-dexamethasone (CIPRODEX) otic suspension  -Early otitis externa  -Start Ciprodex drops 2. Back pain   - Plan: meloxicam (MOBIC) 15 MG tablet  -Start Mobic 15 mg daily with food  -Recommend exercise for 20-30 minutes daily at least 3 times a week  -If no improvement over the next 1-2 weeks, may consider PT   Follow up if symptoms worsen or fail to improve

## 2013-08-14 ENCOUNTER — Ambulatory Visit (INDEPENDENT_AMBULATORY_CARE_PROVIDER_SITE_OTHER): Payer: Commercial Managed Care - PPO | Admitting: Family Medicine

## 2013-08-14 VITALS — BP 140/78 | HR 82 | Temp 98.7°F | Resp 20 | Ht 77.0 in | Wt 315.0 lb

## 2013-08-14 DIAGNOSIS — J069 Acute upper respiratory infection, unspecified: Secondary | ICD-10-CM

## 2013-08-14 DIAGNOSIS — R42 Dizziness and giddiness: Secondary | ICD-10-CM

## 2013-08-14 DIAGNOSIS — R51 Headache: Secondary | ICD-10-CM

## 2013-08-14 DIAGNOSIS — R1084 Generalized abdominal pain: Secondary | ICD-10-CM

## 2013-08-14 MED ORDER — MECLIZINE HCL 25 MG PO TABS: 25.0000 mg | ORAL_TABLET | Freq: Three times a day (TID) | ORAL | Status: DC | PRN

## 2013-08-14 NOTE — Progress Notes (Signed)
Subjective:    Patient ID: Russell [name], male    DOB: 06-10-1991, 22 y.o.   MRN: [id number]  HPI Russell [name] is a 22 y.o. male PCP: Dr. Merla Riches.   Started with congestion. Pressure in ears. Head cold symptoms.  migraine ha started yesterday. Dizzy last night. Noticed this after pressure behind ears. Dizzy again today when getting into shower, drinking fluids ok, less appetite. No fever.   Lower abd pain on and off for a year, accompanied by bloody stools, but is constipated at the times of bloody stools. Did have bloody stool today, soft stool. Usually hard stools in past when had blood in stools. minimal stomach sx's - similar to ones.    Tx: Dayquil, nyquil. over  Hx of asthma - uses dulera twice per day. Few missed doses. No use of albuterol recently.   Hx of migraine HA, started with headache yesterday   Review of Systems  HENT: Negative for neck pain.   Eyes: Negative for visual disturbance (no phonophobia. ).  Gastrointestinal: Negative for nausea and vomiting.  Neurological: Positive for dizziness and headaches. Negative for facial asymmetry, speech difficulty and weakness.       Objective:   Physical Exam  Vitals reviewed. Constitutional: He is oriented to person, place, and time. He appears well-developed and well-nourished.  HENT:  Head: Normocephalic and atraumatic.  Right Ear: Tympanic membrane, external ear and ear canal normal. No middle ear effusion.  Left Ear: Tympanic membrane, external ear and ear canal normal.  No middle ear effusion.  Nose: No rhinorrhea.  Mouth/Throat: Oropharynx is clear and moist and mucous membranes are normal. No oropharyngeal exudate or posterior oropharyngeal erythema.  Eyes: Conjunctivae are normal. Pupils are equal, round, and reactive to light. Right eye exhibits nystagmus. Left eye exhibits nystagmus (1 beat horizontal nystagmus bilaterally. ).  Neck: Normal range of motion. Neck supple.  Cardiovascular: Normal rate,  regular rhythm, normal heart sounds and intact distal pulses.   No murmur heard. Pulmonary/Chest: Effort normal and breath sounds normal. He has no wheezes. He has no rhonchi. He has no rales.  Abdominal: Soft. Bowel sounds are normal. He exhibits no distension. There is tenderness (minimal epigastric). There is no rebound and no guarding.  Lymphadenopathy:    He has no cervical adenopathy.  Neurological: He is alert and oriented to person, place, and time. He has normal strength. No sensory deficit. He displays a negative Romberg sign.  Nonfocal.   Skin: Skin is warm and dry. No rash noted.  Psychiatric: He has a normal mood and affect. His behavior is normal.          Assessment & Plan:  Russell [name] is a 22 y.o. male Acute upper respiratory infections of unspecified site, with pressure ha, vs mild migraine.  Sx care - saline NS,  Sudafed or afrin - short course if needed. rtc precautions.   Dizziness - Plan: meclizine (ANTIVERT) 25 MG tablet for posssible etd/serous otitis, but did not appreciate much fluid on exam. rtc precautions.   Headache(784.0) - as above. nonfocal exam. Does not appear migranous at present.   Abdominal pain, generalized - intermittent and reported hx of bloody stools without prior workup.  May have constipation component, but will need to follow up with primary provider in next few weeks, or rtc sooner if not improving in next day or blood persists.   Patient Instructions  Saline nasal spray atleast 4 times per day, albuterol if needed for wheeze/asthma symptoms. (return  for recheck if needed more than 3 times per day or worsening) drink plenty of fluids.  Afrin nasal spray up to 3 days only or Sudafed for pressure headache. Follow up with primary provider to discuss abdominal pain, but if does not resolve tomorrow or blood remains - return to clinic for recheck in next 2 days.   Return to the clinic or go to the nearest emergency room if any of your  symptoms worsen or new symptoms occur. Upper Respiratory Infection, Adult An upper respiratory infection (URI) is also sometimes known as the common cold. The upper respiratory tract includes the nose, sinuses, throat, trachea, and bronchi. Bronchi are the airways leading to the lungs. Most people improve within 1 week, but symptoms can last up to 2 weeks. A residual cough may last even longer.  CAUSES Many different viruses can infect the tissues lining the upper respiratory tract. The tissues become irritated and inflamed and often become very moist. Mucus production is also common. A cold is contagious. You can easily spread the virus to others by oral contact. This includes kissing, sharing a glass, coughing, or sneezing. Touching your mouth or nose and then touching a surface, which is then touched by another person, can also spread the virus. SYMPTOMS  Symptoms typically develop 1 to 3 days after you come in contact with a cold virus. Symptoms vary from person to person. They may include:  Runny nose.  Sneezing.  Nasal congestion.  Sinus irritation.  Sore throat.  Loss of voice (laryngitis).  Cough.  Fatigue.  Muscle aches.  Loss of appetite.  Headache.  Low-grade fever. DIAGNOSIS  You might diagnose your own cold based on familiar symptoms, since most people get a cold 2 to 3 times a year. Your caregiver can confirm this based on your exam. Most importantly, your caregiver can check that your symptoms are not due to another disease such as strep throat, sinusitis, pneumonia, asthma, or epiglottitis. Blood tests, throat tests, and X-rays are not necessary to diagnose a common cold, but they may sometimes be helpful in excluding other more serious diseases. Your caregiver will decide if any further tests are required. RISKS AND COMPLICATIONS  You may be at risk for a more severe case of the common cold if you smoke cigarettes, have chronic heart disease (such as heart failure)  or lung disease (such as asthma), or if you have a weakened immune system. The very young and very old are also at risk for more serious infections. Bacterial sinusitis, middle ear infections, and bacterial pneumonia can complicate the common cold. The common cold can worsen asthma and chronic obstructive pulmonary disease (COPD). Sometimes, these complications can require emergency medical care and may be life-threatening. PREVENTION  The best way to protect against getting a cold is to practice good hygiene. Avoid oral or hand contact with people with cold symptoms. Wash your hands often if contact occurs. There is no clear evidence that vitamin C, vitamin E, echinacea, or exercise reduces the chance of developing a cold. However, it is always recommended to get plenty of rest and practice good nutrition. TREATMENT  Treatment is directed at relieving symptoms. There is no cure. Antibiotics are not effective, because the infection is caused by a virus, not by bacteria. Treatment may include:  Increased fluid intake. Sports drinks offer valuable electrolytes, sugars, and fluids.  Breathing heated mist or steam (vaporizer or shower).  Eating chicken soup or other clear broths, and maintaining good nutrition.  Getting plenty  of rest.  Using gargles or lozenges for comfort.  Controlling fevers with ibuprofen or acetaminophen as directed by your caregiver.  Increasing usage of your inhaler if you have asthma. Zinc gel and zinc lozenges, taken in the first 24 hours of the common cold, can shorten the duration and lessen the severity of symptoms. Pain medicines may help with fever, muscle aches, and throat pain. A variety of non-prescription medicines are available to treat congestion and runny nose. Your caregiver can make recommendations and may suggest nasal or lung inhalers for other symptoms.  HOME CARE INSTRUCTIONS   Only take over-the-counter or prescription medicines for pain, discomfort, or  fever as directed by your caregiver.  Use a warm mist humidifier or inhale steam from a shower to increase air moisture. This may keep secretions moist and make it easier to breathe.  Drink enough water and fluids to keep your urine clear or pale yellow.  Rest as needed.  Return to work when your temperature has returned to normal or as your caregiver advises. You may need to stay home longer to avoid infecting others. You can also use a face mask and careful hand washing to prevent spread of the virus. SEEK MEDICAL CARE IF:   After the first few days, you feel you are getting worse rather than better.  You need your caregiver's advice about medicines to control symptoms.  You develop chills, worsening shortness of breath, or brown or red sputum. These may be signs of pneumonia.  You develop yellow or brown nasal discharge or pain in the face, especially when you bend forward. These may be signs of sinusitis.  You develop a fever, swollen neck glands, pain with swallowing, or white areas in the back of your throat. These may be signs of strep throat. SEEK IMMEDIATE MEDICAL CARE IF:   You have a fever.  You develop severe or persistent headache, ear pain, sinus pain, or chest pain.  You develop wheezing, a prolonged cough, cough up blood, or have a change in your usual mucus (if you have chronic lung disease).  You develop sore muscles or a stiff neck. Document Released: 05/31/2001 Document Revised: 02/27/2012 Document Reviewed: 04/08/2011 [facility] Patient Information 2014 [place], [place].

## 2013-08-14 NOTE — Patient Instructions (Signed)
Saline nasal spray atleast 4 times per day, albuterol if needed for wheeze/asthma symptoms. (return for recheck if needed more than 3 times per day or worsening) drink plenty of fluids.  Afrin nasal spray up to 3 days only or Sudafed for pressure headache. Follow up with primary provider to discuss abdominal pain, but if does not resolve tomorrow or blood remains - return to clinic for recheck in next 2 days.   Return to the clinic or go to the nearest emergency room if any of your symptoms worsen or new symptoms occur. Upper Respiratory Infection, Adult An upper respiratory infection (URI) is also sometimes known as the common cold. The upper respiratory tract includes the nose, sinuses, throat, trachea, and bronchi. Bronchi are the airways leading to the lungs. Most people improve within 1 week, but symptoms can last up to 2 weeks. A residual cough may last even longer.  CAUSES Many different viruses can infect the tissues lining the upper respiratory tract. The tissues become irritated and inflamed and often become very moist. Mucus production is also common. A cold is contagious. You can easily spread the virus to others by oral contact. This includes kissing, sharing a glass, coughing, or sneezing. Touching your mouth or nose and then touching a surface, which is then touched by another person, can also spread the virus. SYMPTOMS  Symptoms typically develop 1 to 3 days after you come in contact with a cold virus. Symptoms vary from person to person. They may include:  Runny nose.  Sneezing.  Nasal congestion.  Sinus irritation.  Sore throat.  Loss of voice (laryngitis).  Cough.  Fatigue.  Muscle aches.  Loss of appetite.  Headache.  Low-grade fever. DIAGNOSIS  You might diagnose your own cold based on familiar symptoms, since most people get a cold 2 to 3 times a year. Your caregiver can confirm this based on your exam. Most importantly, your caregiver can check that your  symptoms are not due to another disease such as strep throat, sinusitis, pneumonia, asthma, or epiglottitis. Blood tests, throat tests, and X-rays are not necessary to diagnose a common cold, but they may sometimes be helpful in excluding other more serious diseases. Your caregiver will decide if any further tests are required. RISKS AND COMPLICATIONS  You may be at risk for a more severe case of the common cold if you smoke cigarettes, have chronic heart disease (such as heart failure) or lung disease (such as asthma), or if you have a weakened immune system. The very young and very old are also at risk for more serious infections. Bacterial sinusitis, middle ear infections, and bacterial pneumonia can complicate the common cold. The common cold can worsen asthma and chronic obstructive pulmonary disease (COPD). Sometimes, these complications can require emergency medical care and may be life-threatening. PREVENTION  The best way to protect against getting a cold is to practice good hygiene. Avoid oral or hand contact with people with cold symptoms. Wash your hands often if contact occurs. There is no clear evidence that vitamin C, vitamin E, echinacea, or exercise reduces the chance of developing a cold. However, it is always recommended to get plenty of rest and practice good nutrition. TREATMENT  Treatment is directed at relieving symptoms. There is no cure. Antibiotics are not effective, because the infection is caused by a virus, not by bacteria. Treatment may include:  Increased fluid intake. Sports drinks offer valuable electrolytes, sugars, and fluids.  Breathing heated mist or steam (vaporizer or shower).  Eating chicken soup or other clear broths, and maintaining good nutrition.  Getting plenty of rest.  Using gargles or lozenges for comfort.  Controlling fevers with ibuprofen or acetaminophen as directed by your caregiver.  Increasing usage of your inhaler if you have asthma. Zinc  gel and zinc lozenges, taken in the first 24 hours of the common cold, can shorten the duration and lessen the severity of symptoms. Pain medicines may help with fever, muscle aches, and throat pain. A variety of non-prescription medicines are available to treat congestion and runny nose. Your caregiver can make recommendations and may suggest nasal or lung inhalers for other symptoms.  HOME CARE INSTRUCTIONS   Only take over-the-counter or prescription medicines for pain, discomfort, or fever as directed by your caregiver.  Use a warm mist humidifier or inhale steam from a shower to increase air moisture. This may keep secretions moist and make it easier to breathe.  Drink enough water and fluids to keep your urine clear or pale yellow.  Rest as needed.  Return to work when your temperature has returned to normal or as your caregiver advises. You may need to stay home longer to avoid infecting others. You can also use a face mask and careful hand washing to prevent spread of the virus. SEEK MEDICAL CARE IF:   After the first few days, you feel you are getting worse rather than better.  You need your caregiver's advice about medicines to control symptoms.  You develop chills, worsening shortness of breath, or brown or red sputum. These may be signs of pneumonia.  You develop yellow or brown nasal discharge or pain in the face, especially when you bend forward. These may be signs of sinusitis.  You develop a fever, swollen neck glands, pain with swallowing, or white areas in the back of your throat. These may be signs of strep throat. SEEK IMMEDIATE MEDICAL CARE IF:   You have a fever.  You develop severe or persistent headache, ear pain, sinus pain, or chest pain.  You develop wheezing, a prolonged cough, cough up blood, or have a change in your usual mucus (if you have chronic lung disease).  You develop sore muscles or a stiff neck. Document Released: 05/31/2001 Document Revised:  02/27/2012 Document Reviewed: 04/08/2011 [facility] Patient Information 2014 [place], [place].

## 2013-08-15 ENCOUNTER — Other Ambulatory Visit: Payer: Self-pay | Admitting: Physician Assistant

## 2013-09-17 ENCOUNTER — Ambulatory Visit (INDEPENDENT_AMBULATORY_CARE_PROVIDER_SITE_OTHER): Payer: Commercial Managed Care - PPO | Admitting: Emergency Medicine

## 2013-09-17 VITALS — BP 112/80 | HR 80 | Temp 98.1°F | Resp 18 | Ht 77.0 in | Wt 317.0 lb

## 2013-09-17 DIAGNOSIS — IMO0002 Reserved for concepts with insufficient information to code with codable children: Secondary | ICD-10-CM

## 2013-09-17 DIAGNOSIS — M25572 Pain in left ankle and joints of left foot: Secondary | ICD-10-CM

## 2013-09-17 DIAGNOSIS — M25579 Pain in unspecified ankle and joints of unspecified foot: Secondary | ICD-10-CM

## 2013-09-17 DIAGNOSIS — L03012 Cellulitis of left finger: Secondary | ICD-10-CM

## 2013-09-17 MED ORDER — SULFAMETHOXAZOLE-TRIMETHOPRIM 800-160 MG PO TABS: 1.0000 | ORAL_TABLET | Freq: Two times a day (BID) | ORAL | Status: DC

## 2013-09-17 NOTE — Progress Notes (Signed)
[facility] [address] [place] [address] [id number] [phone]  Date:  09/17/2013   Name:  Russell [name]   DOB:  12/18/1991   MRN:  [id number]  PCP:  Tonye Pearson, MD    Chief Complaint: Foot Injury and Finger infected   History of Present Illness:  Russell [name] is a 22 y.o. very pleasant male patient who presents with the following:  Has painful swelling on lateral nail fold left third finger that he drained himself last night.  Now tender and red. No deformity.  Has a painful mass on the plantar left foot.  Is a patient of a podiatrist..  No improvement with over the counter medications or other home remedies. Denies other complaint or health concern today.   There are no active problems to display for this patient.   Past Medical History  Diagnosis Date  . Asthma   . Obesity   . Allergy     Past Surgical History  Procedure Laterality Date  . Tonsillectomy      History  Substance Use Topics  . Smoking status: Never Smoker   . Smokeless tobacco: Not on file  . Alcohol Use: No    Family History  Problem Relation Age of Onset  . Hypertension Mother   . Stroke Father   . Heart disease Maternal Grandfather     Allergies  Allergen Reactions  . Penicillins     Unknown    Medication list has been reviewed and updated.  Current Outpatient Prescriptions on File Prior to Visit  Medication Sig Dispense Refill  . albuterol (PROVENTIL HFA;VENTOLIN HFA) 108 (90 BASE) MCG/ACT inhaler Inhale 2 puffs into the lungs every 6 (six) hours as needed. For shortness of breath  1 Inhaler  5  . cefdinir (OMNICEF) 300 MG capsule Take 1 capsule (300 mg total) by mouth 2 (two) times daily.  20 capsule  0  . ciprofloxacin-dexamethasone (CIPRODEX) otic suspension Place 4 drops into the left ear 2 (two) times daily.  7.5 mL  0  . DULERA 100-5 MCG/ACT AERO INHALE 2 PUFFS INTO THE LUNGS 2 (TWO) TIMES DAILY.  13 g  1  . EPINEPHrine (EPIPEN) 0.3 mg/0.3 mL DEVI  Inject 0.3 mLs (0.3 mg total) into the muscle once.  1 Device  0  . fluticasone (FLONASE) 50 MCG/ACT nasal spray Place 2 sprays into the nose daily.  16 g  6  . meclizine (ANTIVERT) 25 MG tablet Take 1 tablet (25 mg total) by mouth 3 (three) times daily as needed for dizziness.  30 tablet  0  . meloxicam (MOBIC) 15 MG tablet Take 1 tablet (15 mg total) by mouth daily.  30 tablet  1  . mometasone-formoterol (DULERA) 100-5 MCG/ACT AERO Inhale 2 puffs into the lungs 2 (two) times daily.  1 Inhaler  5  . cetirizine (ZYRTEC) 10 MG tablet Take 1 tablet (10 mg total) by mouth daily.  30 tablet  11   No current facility-administered medications on file prior to visit.    Review of Systems:  As per HPI, otherwise negative.    Physical Examination: Filed Vitals:   09/17/13 1443  BP: 112/80  Pulse: 80  Temp: 98.1 F (36.7 C)  Resp: 18   Filed Vitals:   09/17/13 1443  Height: 6\' 5"  (1.956 m)  Weight: 317 lb (143.79 kg)   Body mass index is 37.58 kg/(m^2). Ideal Body Weight: Weight in (lb) to have BMI = 25: 210.4  GEN: WDWN, NAD, Non-toxic, Alert & Oriented x 3 HEENT: Atraumatic, Normocephalic.  Ears and Nose: No external deformity. EXTR: No clubbing/cyanosis/edema NEURO: Normal gait.  PSYCH: Normally interactive. Conversant. Not depressed or anxious appearing.  Calm demeanor.  LEFT hand:  Third finger paronychia.  Not ready to open LEFT Foot:  Tender mass on plantar surface of foot.    Assessment and Plan: Foot mass Paronychia Septra  Warm soaks Follow up with podiatrist   Signed,  Phillips Odor, MD

## 2013-09-17 NOTE — Patient Instructions (Addendum)
Paronychia Paronychia is an inflammatory reaction involving the folds of the skin surrounding the fingernail. This is commonly caused by an infection in the skin around a nail. The most common cause of paronychia is frequent wetting of the hands (as seen with bartenders, food servers, nurses or others who wet their hands). This makes the skin around the fingernail susceptible to infection by bacteria (germs) or fungus. Other predisposing factors are:  Aggressive manicuring.  Nail biting.  Thumb sucking. The most common cause is a staphylococcal (a type of germ) infection, or a fungal (Candida) infection. When caused by a germ, it usually comes on suddenly with redness, swelling, pus and is often painful. It may get under the nail and form an abscess (collection of pus), or form an abscess around the nail. If the nail itself is infected with a fungus, the treatment is usually prolonged and may require oral medicine for up to one year. Your caregiver will determine the length of time treatment is required. The paronychia caused by bacteria (germs) may largely be avoided by not pulling on hangnails or picking at cuticles. When the infection occurs at the tips of the finger it is called felon. When the cause of paronychia is from the herpes simplex virus (HSV) it is called herpetic whitlow. TREATMENT  When an abscess is present treatment is often incision and drainage. This means that the abscess must be cut open so the pus can get out. When this is done, the following home care instructions should be followed. HOME CARE INSTRUCTIONS   It is important to keep the affected fingers very dry. Rubber or plastic gloves over cotton gloves should be used whenever the hand must be placed in water.  Keep wound clean, dry and dressed as suggested by your caregiver between warm soaks or warm compresses.  Soak in warm water for fifteen to twenty minutes three to four times per day for bacterial infections. Fungal  infections are very difficult to treat, so often require treatment for long periods of time.  For bacterial (germ) infections take antibiotics (medicine which kill germs) as directed and finish the prescription, even if the problem appears to be solved before the medicine is gone.  Only take over-the-counter or prescription medicines for pain, discomfort, or fever as directed by your caregiver. SEEK IMMEDIATE MEDICAL CARE IF:  You have redness, swelling, or increasing pain in the wound.  You notice pus coming from the wound.  You have a fever.  You notice a bad smell coming from the wound or dressing. Document Released: 05/31/2001 Document Revised: 02/27/2012 Document Reviewed: 01/30/2009 [employer] Patient Information 2014 [employer].  

## 2013-09-24 ENCOUNTER — Other Ambulatory Visit: Payer: Self-pay | Admitting: Physician Assistant

## 2013-10-06 ENCOUNTER — Other Ambulatory Visit: Payer: Self-pay | Admitting: Physician Assistant

## 2013-10-07 ENCOUNTER — Other Ambulatory Visit: Payer: Self-pay | Admitting: Physician Assistant

## 2013-10-07 NOTE — Telephone Encounter (Signed)
Needs OV to discuss asthma medication and dosing

## 2013-11-02 ENCOUNTER — Ambulatory Visit (INDEPENDENT_AMBULATORY_CARE_PROVIDER_SITE_OTHER): Payer: Commercial Managed Care - PPO | Admitting: Emergency Medicine

## 2013-11-02 VITALS — BP 112/78 | HR 96 | Temp 98.8°F | Resp 18 | Ht 77.0 in | Wt 311.0 lb

## 2013-11-02 DIAGNOSIS — J45909 Unspecified asthma, uncomplicated: Secondary | ICD-10-CM

## 2013-11-02 MED ORDER — PREDNISONE (PAK) 10 MG PO TABS: ORAL_TABLET | Freq: Every day | ORAL | Status: DC

## 2013-11-02 MED ORDER — ALBUTEROL SULFATE (2.5 MG/3ML) 0.083% IN NEBU
2.5000 mg | INHALATION_SOLUTION | Freq: Once | RESPIRATORY_TRACT | Status: AC
  Administered 2013-11-02: 2.5 mg via RESPIRATORY_TRACT

## 2013-11-02 MED ORDER — MOMETASONE FURO-FORMOTEROL FUM 200-5 MCG/ACT IN AERO: INHALATION_SPRAY | RESPIRATORY_TRACT | Status: DC

## 2013-11-02 MED ORDER — IPRATROPIUM BROMIDE 0.02 % IN SOLN
0.5000 mg | Freq: Once | RESPIRATORY_TRACT | Status: AC
  Administered 2013-11-02: 0.5 mg via RESPIRATORY_TRACT

## 2013-11-02 MED ORDER — ALBUTEROL SULFATE HFA 108 (90 BASE) MCG/ACT IN AERS: 2.0000 | INHALATION_SPRAY | Freq: Four times a day (QID) | RESPIRATORY_TRACT | Status: DC | PRN

## 2013-11-02 NOTE — Progress Notes (Addendum)
This chart was scribed for Lesle Chris, MD by Joaquin Music, ED Scribe. This patient was seen in room Room/bed 9 and the patient's care was started at 6:19 PM. Subjective:    Patient ID: Russell [name], male    DOB: [birth year], 21 y.o.   MRN: [id number] Chief Complaint  Patient presents with  . Asthma    trouble breathing in morning, 1 week   HPI Russell [name] is a 22 y.o. male with a hx of asthma who presents to the [facility] complaining of SOB and severe asthma flare ups for the past week. Pt states his symptoms worsen in the morning when he wakes up. Pt states he has an inhaler and has been using his inhaler frequently due to his flare ups. Pt states he has several allergies. He states he at times has yellow sputum. Pt denies hx of smoking.  Pt took prednisone over 6 months ago. Pt takes Dolair on a daily basis. Pt state he no longer has proair.  Review of Systems  Respiratory: Positive for cough, shortness of breath and wheezing.    Objective:   Physical Exam CONSTITUTIONAL: Well developed/well nourished HEAD: Normocephalic/atraumatic EYES: EOMI/PERRL ENMT: Mucous membranes moist NECK: supple no meningeal signs SPINE:entire spine nontender CV: S1/S2 noted, no murmurs/rubs/gallops noted LUNGS: There are bilateral wheezes noted. They're worse on the left than the right. Breath sounds are symmetrical  ABDOMEN: soft, nontender, no rebound or guarding GU:no cva tenderness NEURO: Pt is awake/alert, moves all extremitiesx4 EXTREMITIES: pulses normal, full ROM SKIN: warm, color normal PSYCH: no abnormalities of mood noted  Triage Vitals:BP 112/78  Pulse 96  Temp(Src) 98.8 F (37.1 C)  Resp 18  Ht 6\' 5"  (1.956 m)  Wt 311 lb (141.069 kg)  BMI 36.87 kg/m2  SpO2 98% Assessment & Plan:  We'll treat with Dulera at the higher dose 2 puffs twice a day we'll give him a short taper of prednisone. His albuterol inhaler was also refilled.

## 2014-06-18 ENCOUNTER — Other Ambulatory Visit: Payer: Self-pay | Admitting: Emergency Medicine

## 2014-09-27 ENCOUNTER — Other Ambulatory Visit: Payer: Self-pay | Admitting: Physician Assistant

## 2014-11-01 ENCOUNTER — Ambulatory Visit (INDEPENDENT_AMBULATORY_CARE_PROVIDER_SITE_OTHER): Payer: Commercial Managed Care - PPO | Admitting: Internal Medicine

## 2014-11-01 VITALS — BP 121/74 | HR 89 | Temp 98.3°F | Resp 16 | Ht 75.5 in | Wt 295.4 lb

## 2014-11-01 DIAGNOSIS — H6532 Chronic mucoid otitis media, left ear: Secondary | ICD-10-CM

## 2014-11-01 DIAGNOSIS — J0100 Acute maxillary sinusitis, unspecified: Secondary | ICD-10-CM

## 2014-11-01 DIAGNOSIS — Z6836 Body mass index (BMI) 36.0-36.9, adult: Secondary | ICD-10-CM

## 2014-11-01 MED ORDER — CEFDINIR 300 MG PO CAPS: 300.0000 mg | ORAL_CAPSULE | Freq: Two times a day (BID) | ORAL | Status: DC

## 2014-11-01 MED ORDER — HYDROCODONE-HOMATROPINE 5-1.5 MG/5ML PO SYRP: 5.0000 mL | ORAL_SOLUTION | Freq: Four times a day (QID) | ORAL | Status: DC | PRN

## 2014-11-01 NOTE — Progress Notes (Signed)
Subjective:  This chart was scribed for Ellamae Siaobert Doolittle, MD by Richarda Overlieichard Holland, Medical scribe. This patient was seen in ROOM 3 and the patient's care was started 1:39 PM.  Patient ID: Russell [name], male    DOB: 04-24-1991, 23 y.o.   MRN: [id number]  Cough Pertinent negatives include no fever.   HPI Comments: Russell [name] is a 23 y.o. male who [facility] complaining of congestion that started 3 weeks ago. Pt reports associated sinus pressure and productive cough and states it keeps him up at night. He reports his ears are popping all the time and has tinnitus. Pt states he is still using Dulera which helps his symtpoms. He has a long history of asthma or reactive bronchospasm.No fever. No sore throat.  Of interest, he has long complained of multiple hearing and the feeling of being congested in the left ear and this has never been examined completely by ENT.  Pt reports he is allergic to penicillins.  There are no active problems to display for this patient.  Allergies  Allergen Reactions  . Penicillins     Unknown   Current Outpatient Prescriptions on File Prior to Visit  Medication Sig Dispense Refill  . EPINEPHrine (EPIPEN) 0.3 mg/0.3 mL DEVI Inject 0.3 mLs (0.3 mg total) into the muscle once. 1 Device 0  . mometasone-formoterol (DULERA) 200-5 MCG/ACT AERO Use 2 puffs by amylase and twice a day followed by rinsing your  mouth out with water 1 Inhaler 4  . VENTOLIN HFA 108 (90 BASE) MCG/ACT inhaler INHALE 2 PUFFS INTO LUNGS EVERY 6 HOURS AS NEEDED 18 each 0  . cetirizine (ZYRTEC) 10 MG tablet Take 1 tablet (10 mg total) by mouth daily. 30 tablet 11  . fluticasone (FLONASE) 50 MCG/ACT nasal spray Place 2 sprays into the nose daily. 16 g 6  . meclizine (ANTIVERT) 25 MG tablet Take 1 tablet (25 mg total) by mouth 3 (three) times daily as needed for dizziness. 30 tablet 0  . meloxicam (MOBIC) 15 MG tablet TAKE 1 TABLET BY MOUTH EVERY DAY 30 tablet 2   No current  facility-administered medications on file prior to visit.   Family History  Problem Relation Age of Onset  . Hypertension Mother   . Stroke Father   . Heart disease Maternal Grandfather    Past Surgical History  Procedure Laterality Date  . Tonsillectomy     History   Social History  . Marital Status: Single    Spouse Name: N/A    Number of Children: N/A  . Years of Education: N/A   Occupational History  . Not on file.   Social History Main Topics  . Smoking status: Never Smoker   . Smokeless tobacco: Never Used  . Alcohol Use: No  . Drug Use: No  . Sexual Activity: Yes   Other Topics Concern  . Not on file   Social History Narrative   Filed Vitals:   11/01/14 1304  BP: 121/74  Pulse: 89  Temp: 98.3 F (36.8 C)  Resp: 16    Review of Systems  Constitutional: Negative for fever.  HENT: Positive for congestion and sinus pressure.   Respiratory: Positive for cough.      Objective:  Physical Exam  Constitutional: He is oriented to person, place, and time. He appears well-developed and well-nourished. No distress.  HENT:  Head: Normocephalic and atraumatic.  Left tympanic membrane is opaque with whiteness at the inferior border and a loss of normal  architecture. Right TM is clear.  Purulent discharge from nares. Tender maxillary areas. Throat is slightly red.   Eyes: Conjunctivae and EOM are normal. Pupils are equal, round, and reactive to light.  Neck: Neck supple.  Cardiovascular: Normal rate.   Pulmonary/Chest: Effort normal. He has no wheezes.  Scarrted rhonchii bilaterally with slight wheezing and on expiration.   Lymphadenopathy:    He has no cervical adenopathy.  Neurological: He is alert and oriented to person, place, and time. No cranial nerve deficit.  Skin:  Mild early hyperpigmentation of the extremities without edema.   Psychiatric: He has a normal mood and affect. His behavior is normal.  Nursing note and vitals reviewed.        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 HYDROcodone-homatropine (HYCODAN) 5-1.5 MG/5ML syrup    Sig: Take 5 mLs by mouth every 6 (six) hours as needed.    Dispense:  120 mL    Refill:  0  . cefdinir (OMNICEF) 300 MG capsule    Sig: Take 1 capsule (300 mg total) by mouth 2 (two) times daily.    Dispense:  20 capsule    Refill:  0     I have completed the patient encounter in its entirety as documented by the scribe, with editing by me where necessary. Robert P. Merla Richesoolittle, M.D.

## 2014-11-02 DIAGNOSIS — Z6836 Body mass index (BMI) 36.0-36.9, adult: Secondary | ICD-10-CM | POA: Insufficient documentation

## 2014-11-17 ENCOUNTER — Encounter (HOSPITAL_COMMUNITY): Payer: Self-pay | Admitting: Family Medicine

## 2014-11-17 ENCOUNTER — Emergency Department (HOSPITAL_COMMUNITY)
Admission: EM | Admit: 2014-11-17 | Discharge: 2014-11-17 | Disposition: A | Discharge status: Home / Self Care | Payer: Commercial Managed Care - PPO | Attending: Emergency Medicine | Admitting: Emergency Medicine

## 2014-11-17 DIAGNOSIS — S51812A Laceration without foreign body of left forearm, initial encounter: Secondary | ICD-10-CM | POA: Diagnosis not present

## 2014-11-17 DIAGNOSIS — Z880 Allergy status to penicillin: Secondary | ICD-10-CM | POA: Diagnosis not present

## 2014-11-17 DIAGNOSIS — Z7951 Long term (current) use of inhaled steroids: Secondary | ICD-10-CM | POA: Insufficient documentation

## 2014-11-17 DIAGNOSIS — E669 Obesity, unspecified: Secondary | ICD-10-CM | POA: Insufficient documentation

## 2014-11-17 DIAGNOSIS — F329 Major depressive disorder, single episode, unspecified: Secondary | ICD-10-CM | POA: Diagnosis not present

## 2014-11-17 DIAGNOSIS — Z79899 Other long term (current) drug therapy: Secondary | ICD-10-CM | POA: Diagnosis not present

## 2014-11-17 DIAGNOSIS — S59912A Unspecified injury of left forearm, initial encounter: Secondary | ICD-10-CM | POA: Diagnosis present

## 2014-11-17 DIAGNOSIS — J45909 Unspecified asthma, uncomplicated: Secondary | ICD-10-CM | POA: Insufficient documentation

## 2014-11-17 DIAGNOSIS — Y998 Other external cause status: Secondary | ICD-10-CM | POA: Insufficient documentation

## 2014-11-17 DIAGNOSIS — X788XXA Intentional self-harm by other sharp object, initial encounter: Secondary | ICD-10-CM | POA: Diagnosis not present

## 2014-11-17 DIAGNOSIS — Y9289 Other specified places as the place of occurrence of the external cause: Secondary | ICD-10-CM | POA: Insufficient documentation

## 2014-11-17 DIAGNOSIS — Z791 Long term (current) use of non-steroidal anti-inflammatories (NSAID): Secondary | ICD-10-CM | POA: Diagnosis not present

## 2014-11-17 DIAGNOSIS — F32A Depression, unspecified: Secondary | ICD-10-CM

## 2014-11-17 DIAGNOSIS — Y9389 Activity, other specified: Secondary | ICD-10-CM | POA: Insufficient documentation

## 2014-11-17 MED ORDER — TETANUS-DIPHTH-ACELL PERTUSSIS 5-2.5-18.5 LF-MCG/0.5 IM SUSP
0.5000 mL | Freq: Once | INTRAMUSCULAR | Status: AC
  Administered 2014-11-17: 0.5 mL via INTRAMUSCULAR
  Filled 2014-11-17: qty 0.5

## 2014-11-17 MED ORDER — LIDOCAINE-EPINEPHRINE (PF) 2 %-1:200000 IJ SOLN
10.0000 mL | Freq: Once | INTRAMUSCULAR | Status: AC
  Administered 2014-11-17: 10 mL via INTRADERMAL
  Filled 2014-11-17: qty 20

## 2014-11-17 NOTE — ED Notes (Signed)
Pt valuables given to security and locked in safe.

## 2014-11-17 NOTE — ED Notes (Signed)
Per pt sts he has been stressed and cut his left forearm today. Pt has bandage on the arm. Bleeding controlled. Sensation and movement in tact. Pt denies SI/HI.

## 2014-11-17 NOTE — ED Provider Notes (Signed)
CSN: [id number]     Arrival date & time 11/17/14  1425 History   First MD Initiated Contact with Patient 11/17/14 1525     Chief Complaint  Patient presents with  . Laceration     (Consider location/radiation/quality/duration/timing/severity/associated sxs/prior Treatment) HPI Russell [name] is a 23 y.o. male with no major medical problems, presents to emergency department for evaluation of stress, depression, left forearm laceration. He states he intentionally cut his left forearm on a box cutter. He states that he has been under a lot of stress, states his mother is sick in the hospital, states he is having trouble dealing with his current stressors. He denies ever being diagnosed with depression or anxiety. He states he has seen counselors in the past, and has cut himself 10 years ago in middle school. He states he cut himself not to "kill myself but to take the pain away." Denies history of suicide attempts. He is not on any medications at this time for this. He denies homicidal ideations. No numbness and tingling past the laceration. Denies any weakness to the hand. Tetanus unknown  Past Medical History  Diagnosis Date  . Asthma   . Obesity   . Allergy    Past Surgical History  Procedure Laterality Date  . Tonsillectomy     Family History  Problem Relation Age of Onset  . Hypertension Mother   . Stroke Father   . Heart disease Maternal Grandfather    History  Substance Use Topics  . Smoking status: Never Smoker   . Smokeless tobacco: Never Used  . Alcohol Use: No    Review of Systems  Constitutional: Negative for fever and chills.  Respiratory: Negative for cough, chest tightness and shortness of breath.   Cardiovascular: Negative for chest pain, palpitations and leg swelling.  Genitourinary: Negative for dysuria, urgency, frequency and hematuria.  Musculoskeletal: Negative for neck pain and neck stiffness.  Skin: Positive for wound. Negative for rash.    Allergic/Immunologic: Negative for immunocompromised state.  Neurological: Negative for dizziness, weakness, light-headedness, numbness and headaches.  Psychiatric/Behavioral: Positive for self-injury and decreased concentration. Negative for suicidal ideas. The patient is nervous/anxious.       Allergies  Penicillins  Home Medications   Prior to Admission medications   Medication Sig Start Date End Date Taking? Authorizing Provider  fluticasone (FLONASE) 50 MCG/ACT nasal spray Place 2 sprays into the nose daily. 01/21/13  Yes Eleanore Delia ChimesE Egan, PA-C  meclizine (ANTIVERT) 25 MG tablet Take 1 tablet (25 mg total) by mouth 3 (three) times daily as needed for dizziness. 08/14/13  Yes Shade FloodJeffrey R Greene, MD  meloxicam (MOBIC) 15 MG tablet TAKE 1 TABLET BY MOUTH EVERY DAY 09/24/13  Yes Heather M Marte, PA-C  mometasone-formoterol [facility](DULERA) 200-5 MCG/ACT AERO Use 2 puffs by amylase and twice a day followed by rinsing your  mouth out with water 11/02/13  Yes Collene GobbleSteven A Daub, MD  VENTOLIN HFA 108 (90 BASE) MCG/ACT inhaler INHALE 2 PUFFS INTO LUNGS EVERY 6 HOURS AS NEEDED 09/30/14  Yes Collene GobbleSteven A Daub, MD  EPINEPHrine (EPIPEN) 0.3 mg/0.3 mL DEVI Inject 0.3 mLs (0.3 mg total) into the muscle once. 07/11/12   Heather M Marte, PA-C   BP 150/92 mmHg  Pulse 96  Temp(Src) 98.3 F (36.8 C) (Oral)  Resp 18  SpO2 98% Physical Exam  Constitutional: He appears well-developed and well-nourished. No distress.  HENT:  Head: Normocephalic and atraumatic.  Eyes: Conjunctivae are normal.  Neck: Neck supple.  Cardiovascular: Normal  rate, regular rhythm and normal heart sounds.   Pulmonary/Chest: Effort normal. No respiratory distress. He has no wheezes. He has no rales.  Abdominal: Soft. Bowel sounds are normal. He exhibits no distension. There is no tenderness. There is no rebound.  Musculoskeletal: He exhibits no edema.       Arms: Normal exam of the left hand, full range of motion of all fingers, normal flexion  extension of all fingers against resistance at all joints. Hand is pink and warm, capillary refill less than 2 seconds distally. Distal radial pulse intact. Sensation intact in all dermatomes of the hand  Neurological: He is alert.  Skin: Skin is warm and dry.  5cm laceration to the left dorsal distal forearm. Gaping. Does not involve fascia.   Nursing note and vitals reviewed.   ED Course  Procedures (including critical care time) Labs Review Labs Reviewed - No data to display  Imaging Review No results found.   EKG Interpretation None      LACERATION REPAIR Performed by: [place] PA-S Eri Mcevers A Authorized by: Jaynie CrumbleKIRICHENKO, Jimena Wieczorek A Consent: Verbal consent obtained. Risks and benefits: risks, benefits and alternatives were discussed Consent given by: patient Patient identity confirmed: provided demographic data Prepped and Draped in normal sterile fashion Wound explored  Laceration Location: left forearm  Laceration Length: 6cm  No Foreign Bodies seen or palpated  Anesthesia: local infiltration  Local anesthetic: lidocaine 2% w epinephrine  Anesthetic total: 3 ml  Irrigation method: syringe Amount of cleaning: standard  Skin closure: prolene 4.0  Number of sutures: 9  Technique: simple interrupted  Patient tolerance: Patient tolerated the procedure well with no immediate complications.  MDM   Final diagnoses:  Laceration of left forearm, initial encounter  Depression    Patient is here with laceration to the left forearm, denies suicidal ideations, states he was just trying to deal with his anger and therefore cut his forearm. I will get TTS assessment. Laceration repaired. No evidence of any tendon or nerve injury at this time. Tetanus updated.   7:54 PM Patient seen by TTS. Agrees with assessment, no inpatient admission required. Patient was able to sign a Engineer, manufacturing systemssafety contract. He'll be discharged home with outpatient resources    Filed  Vitals:   11/17/14 1440 11/17/14 1938  BP: 150/92 127/72  Pulse: 96 91  Temp: 98.3 F (36.8 C) 98.9 F (37.2 C)  TempSrc: Oral Oral  Resp: 18 20  SpO2: 98% 98%     Lottie Musselatyana A Beanca Kiester, PA-C 11/17/14 2353  Hilario Quarryanielle S Ray, MD 11/19/14 1235

## 2014-11-17 NOTE — ED Notes (Signed)
Faxed recommendations from TTS given to PA

## 2014-11-17 NOTE — BH Assessment (Addendum)
Tele Assessment Note   Russell Russell [name] is an 23 y.o. male. Russell [name] spoke with Russell Russell [name] re: pt's Russell info. Per Russell [name], pt's laceration required 9 stitches.  Pt is polite and oriented x 4 at time of assessment. Pt denies SI and denies HI. He denies Russell Russell [name] and no delusions noted. Pt reports he became "overwhelmed" at work today and cut himself with a razor blade. He says he was overwhelmed "by everything at once. All the anxiety and stress." Pt reports his cutting today wasn't a suicide attempt. Pt denies prior attempts and denies hx of self harm. He states that he was trying to relieve some of the anxiety by cutting but he got "carried away" and didn't mean to cut that deeply. He reports that he would never kill himself b/c he loves his fiancee and their 514 yo son too much. Pt has no hx of inpatient treatment. 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 Pt reports "happy" mood. He denies any depressive sxs. Pt reports mother is critically ill and at Russell Russell [name]. He says that his fiancee wants to "talk about our relationship" and pt is worried what thatll mean. Pt sts fiancee said she wasn't breaking up with him. Pt also says he worries daily that he will lose his job. Pt denies hx of substance use. Pt tells Russell research associatewriter that he is interested in talking with a therapist. Pt signs no harm contract.  Pt's father Russell Russell [name] is bedside and Russell research associatewriter speaks w/ father via phone. Russell Russell [name] reports that he feels pt isn't a danger to himself. Russell Russell [name] states that pt became "stressed".  Russell [name] runs pt by Dr Gilmore LarocheAkhtar who agrees pt doesn't need inpatient treatment. Russell [name] faxes list of outpatient resources to pt. Russell [name] updated Russell Russell [name] on Russell's recommendation.   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 Asthma   . Obesity   . Allergy    Axis IV: other psychosocial or  environmental problems and problems related to social environment Axis V: 51-60 moderate symptoms  Past Medical History:  Past Medical History  Diagnosis Date  . Asthma   . Obesity   . Allergy     Past Surgical History  Procedure Laterality Date  . Tonsillectomy      Family History:  Family History  Problem Relation Age of Onset  . Hypertension Mother   . Stroke Father   . Heart disease Maternal Grandfather     Social History:  reports that he has never smoked. He has never used smokeless tobacco. He reports that he does not drink alcohol or use illicit drugs.  Additional Social History:  Alcohol / Drug Use Pain Medications: see PTA meds list - pt denies abuse Prescriptions: see PTA meds list - pt denies abuse Over the Counter: see PTA meds list - pt denies abuse History of alcohol / drug use?: No history of alcohol / drug abuse  CIWA: CIWA-Ar BP: 150/92 mmHg Pulse Rate: 96 COWS:    PATIENT STRENGTHS: (choose at least two) Ability for insight Average or above average intelligence Capable of independent living Communication skills  Allergies:  Allergies  Allergen Reactions  . Penicillins     Unknown    Home Medications:  (Not in a Russell [name] admission)  OB/GYN Status:  No LMP for male patient.  General Assessment Data Location of Assessment:  Russell Russell [name] Is this a Tele or Face-to-Face Assessment?: Tele Assessment Is this an Initial Assessment or a Re-assessment for this encounter?: Initial Assessment Living Arrangements: Other (Comment), Spouse/significant other, Parent, Children (mom, dad, fiancee, 49 yo son) Can pt return to current living arrangement?: Yes Admission Status: Voluntary Is patient capable of signing voluntary admission?: Yes Transfer from: Other (Comment) (work) Referral Source: Self/Family/Friend     Russell Russell [name] Crisis Care Plan Living Arrangements: Other (Comment), Spouse/significant other, Parent, Children (mom, dad, fiancee, 8 yo son) Name of  Psychiatrist: none Name of Therapist: none  Education Status Is patient currently in school?: No Highest grade of school patient has completed: 12  Risk to self with the past 6 months Suicidal Ideation: No Suicidal Intent: No Is patient at risk for suicide?: No Suicidal Plan?: No Access to Means: No What has been your use of drugs/alcohol within the last 12 months?: none Previous Attempts/Gestures: No How many times?: 0 Other Self Harm Risks: none Triggers for Past Attempts:  (n/a) Intentional Self Injurious Behavior: None Family Suicide History: No (mom has hx of panic attacks) Recent stressful life event(s): Other (Comment) (mom ill,worried about losing job, fiancee "wants to talk") Persecutory voices/beliefs?: No Depression: No Depression Symptoms:  (pt denies depressive sxs) Substance abuse history and/or treatment for substance abuse?: No Suicide prevention information given to non-admitted patients: Not applicable  Risk to Others within the past 6 months Homicidal Ideation: No Thoughts of Harm to Others: No Current Homicidal Intent: No Current Homicidal Plan: No Access to Homicidal Means: No Identified Victim: none History of harm to others?: No Assessment of Violence: None Noted Violent Behavior Description: pt denies hx violence - is calm during assessment Does patient have access to weapons?: No Criminal Charges Pending?: No Does patient have a court date: No  Psychosis Hallucinations: None noted Delusions: None noted  Mental Status Report Appear/Hygiene: Unremarkable, In scrubs Eye Contact: Good Motor Activity: Freedom of movement Speech: Logical/coherent Level of Consciousness: Alert Mood: Anxious, Euthymic Affect: Appropriate to circumstance Anxiety Level: Panic Attacks Panic attack frequency: pt unsure of frequency Most recent panic attack: pt unsure of date Thought Processes: Relevant, Coherent Judgement: Unimpaired Orientation: Person, Place,  Time, Situation Obsessive Compulsive Thoughts/Behaviors: None  Cognitive Functioning Concentration: Normal Memory: Recent Intact, Remote Intact IQ: Average Insight: Good Impulse Control: Fair Appetite: Good Sleep: No Change Total Hours of Sleep: 7 Vegetative Symptoms: None  ADLScreening [name]) Patient's cognitive ability adequate to safely complete daily activities?: Yes Patient able to express need for assistance with ADLs?: Yes Independently performs ADLs?: Yes (appropriate for developmental age)  Prior Inpatient Therapy Prior Inpatient Therapy: No Prior Therapy Dates: na Prior Therapy Facilty/Provider(s): na Reason for Treatment: na  Prior Outpatient Therapy Prior Outpatient Therapy: No Prior Therapy Dates: na Prior Therapy Facilty/Provider(s): na Reason for Treatment: na  ADL Screening (condition at time of admission) Patient's cognitive ability adequate to safely complete daily activities?: Yes Is the patient deaf or have difficulty hearing?: No Does the patient have difficulty seeing, even when wearing glasses/contacts?: No Does the patient have difficulty concentrating, remembering, or making decisions?: No Patient able to express need for assistance with ADLs?: Yes Does the patient have difficulty dressing or bathing?: No Independently performs ADLs?: Yes (appropriate for developmental age) Does the patient have difficulty walking or climbing stairs?: No Weakness of Legs: None Weakness of Arms/Hands: None  Home Assistive Devices/Equipment Home Assistive Devices/Equipment: None    Abuse/Neglect Assessment (Assessment to be complete while patient is alone) Physical Abuse:  Denies Verbal Abuse: Denies Sexual Abuse: Denies Exploitation of patient/patient's resources: Denies Self-Neglect: Denies     Merchant navy officerAdvance Directives (For Healthcare) Does patient have an advance directive?: No Would patient like information on creating an advanced  directive?: No - patient declined information    Additional Information 1:1 In Past 12 Months?: No CIRT Risk: No Elopement Risk: No Does patient have medical clearance?: Yes     Disposition:  Disposition Initial Assessment Completed for this Encounter: Yes Disposition of Patient: Outpatient treatment Type of outpatient treatment: Adult Gilmore Laroche(Russell Russell [name] recommends outpatient treatment)  Russell Russell [name], Russell Russell [name] 11/17/2014 6:37 PM

## 2014-11-17 NOTE — ED Notes (Addendum)
Pt reports cutting left arm on purpose with a box knife because of stress.  Pt is not suicidal and bleeding is controlled.  Stressors include work, relationship.  Pt has been treated for depression.  Pt has never attempted suicide.  Pt currently in a relationship and what made him cut hisself is fear of losing his significant other. Pt reports girlfriend said "we need to talk," and this brought on anxiety.  Pt is also afraid of losing his job.  Pt has no current thoughts of hurting hisself.

## 2014-11-17 NOTE — Discharge Instructions (Signed)
Keep your wound clean. Bacitracin twice a day. Keep it covered. Follow-up in 10 days for suture removal. Follow up with the resources provided for any psychiatric needs.   Laceration Care, Adult A laceration is a cut or lesion that goes through all layers of the skin and into the tissue just beneath the skin. TREATMENT  Some lacerations may not require closure. Some lacerations may not be able to be closed due to an increased risk of infection. It is important to see your caregiver as soon as possible after an injury to minimize the risk of infection and maximize the opportunity for successful closure. If closure is appropriate, pain medicines may be given, if needed. The wound will be cleaned to help prevent infection. Your caregiver will use stitches (sutures), staples, wound glue (adhesive), or skin adhesive strips to repair the laceration. These tools bring the skin edges together to allow for faster healing and a better cosmetic outcome. However, all wounds will heal with a scar. Once the wound has healed, scarring can be minimized by covering the wound with sunscreen during the day for 1 full year. HOME CARE INSTRUCTIONS  For sutures or staples:  Keep the wound clean and dry.  If you were given a bandage (dressing), you should change it at least once a day. Also, change the dressing if it becomes wet or dirty, or as directed by your caregiver.  Wash the wound with soap and water 2 times a day. Rinse the wound off with water to remove all soap. Pat the wound dry with a clean towel.  After cleaning, apply a thin layer of the antibiotic ointment as recommended by your caregiver. This will help prevent infection and keep the dressing from sticking.  You may shower as usual after the first 24 hours. Do not soak the wound in water until the sutures are removed.  Only take over-the-counter or prescription medicines for pain, discomfort, or fever as directed by your caregiver.  Get your sutures  or staples removed as directed by your caregiver. For skin adhesive strips:  Keep the wound clean and dry.  Do not get the skin adhesive strips wet. You may bathe carefully, using caution to keep the wound dry.  If the wound gets wet, pat it dry with a clean towel.  Skin adhesive strips will fall off on their own. You may trim the strips as the wound heals. Do not remove skin adhesive strips that are still stuck to the wound. They will fall off in time. For wound adhesive:  You may briefly wet your wound in the shower or bath. Do not soak or scrub the wound. Do not swim. Avoid periods of heavy perspiration until the skin adhesive has fallen off on its own. After showering or bathing, gently pat the wound dry with a clean towel.  Do not apply liquid medicine, cream medicine, or ointment medicine to your wound while the skin adhesive is in place. This may loosen the film before your wound is healed.  If a dressing is placed over the wound, be careful not to apply tape directly over the skin adhesive. This may cause the adhesive to be pulled off before the wound is healed.  Avoid prolonged exposure to sunlight or tanning lamps while the skin adhesive is in place. Exposure to ultraviolet light in the first year will darken the scar.  The skin adhesive will usually remain in place for 5 to 10 days, then naturally fall off the skin. Do not  pick at the adhesive film. You may need a tetanus shot if:  You cannot remember when you had your last tetanus shot.  You have never had a tetanus shot. If you get a tetanus shot, your arm may swell, get red, and feel warm to the touch. This is common and not a problem. If you need a tetanus shot and you choose not to have one, there is a rare chance of getting tetanus. Sickness from tetanus can be serious. SEEK MEDICAL CARE IF:   You have redness, swelling, or increasing pain in the wound.  You see a red line that goes away from the wound.  You have  yellowish-white fluid (pus) coming from the wound.  You have a fever.  You notice a bad smell coming from the wound or dressing.  Your wound breaks open before or after sutures have been removed.  You notice something coming out of the wound such as wood or glass.  Your wound is on your hand or foot and you cannot move a finger or toe. SEEK IMMEDIATE MEDICAL CARE IF:   Your pain is not controlled with prescribed medicine.  You have severe swelling around the wound causing pain and numbness or a change in color in your arm, hand, leg, or foot.  Your wound splits open and starts bleeding.  You have worsening numbness, weakness, or loss of function of any joint around or beyond the wound.  You develop painful lumps near the wound or on the skin anywhere on your body. MAKE SURE YOU:   Understand these instructions.  Will watch your condition.  Will get help right away if you are not doing well or get worse. Document Released: 12/05/2005 Document Revised: 02/27/2012 Document Reviewed: 05/31/2011 [facility] Patient Information 2015 [place], [place]. This information is not intended to replace advice given to you by your health care provider. Make sure you discuss any questions you have with your health care provider.

## 2014-12-22 ENCOUNTER — Telehealth: Payer: Self-pay

## 2014-12-22 NOTE — Telephone Encounter (Signed)
Error

## 2015-02-02 ENCOUNTER — Ambulatory Visit (INDEPENDENT_AMBULATORY_CARE_PROVIDER_SITE_OTHER): Payer: BLUE CROSS/BLUE SHIELD | Admitting: Emergency Medicine

## 2015-02-02 VITALS — BP 110/74 | HR 93 | Temp 97.5°F | Resp 18 | Ht 75.75 in | Wt 282.6 lb

## 2015-02-02 DIAGNOSIS — R197 Diarrhea, unspecified: Secondary | ICD-10-CM

## 2015-02-02 DIAGNOSIS — R51 Headache: Secondary | ICD-10-CM

## 2015-02-02 DIAGNOSIS — A084 Viral intestinal infection, unspecified: Secondary | ICD-10-CM

## 2015-02-02 DIAGNOSIS — E860 Dehydration: Secondary | ICD-10-CM

## 2015-02-02 DIAGNOSIS — R112 Nausea with vomiting, unspecified: Secondary | ICD-10-CM

## 2015-02-02 MED ORDER — LOPERAMIDE HCL 2 MG PO TABS: ORAL_TABLET | ORAL | Status: DC

## 2015-02-02 MED ORDER — ONDANSETRON 8 MG PO TBDP: 8.0000 mg | ORAL_TABLET | Freq: Three times a day (TID) | ORAL | Status: DC | PRN

## 2015-02-02 NOTE — Progress Notes (Signed)
[facility] [address], [place] [address] [phone]  Date:  02/02/2015   Name:  Russell [name]   DOB:  Nov 30, 1991   MRN:  [id number]  PCP:  Tonye PearsonOLITTLE, ROBERT P, MD    Chief Complaint: Diarrhea; Nausea; Headache; and Generalized Body Aches   History of Present Illness:  Russell [name] is a 24 y.o. very pleasant male patient who presents with the following:  Ill since Saturday with nausea and diarrhea.   No po intake but scant water. No fever or chills The patient has no complaint of blood, mucous, or pus in her stools. No abdominal pain or lightheadedness The patient has no complaint of blood, mucous, or pus in her stools.  No improvement with over the counter medications or other home remedies.   Patient Active Problem List   Diagnosis Date Noted  . BMI 36.0-36.9,adult 11/02/2014    Past Medical History  Diagnosis Date  . Asthma   . Obesity   . Allergy     Past Surgical History  Procedure Laterality Date  . Tonsillectomy      History  Substance Use Topics  . Smoking status: Never Smoker   . Smokeless tobacco: Never Used  . Alcohol Use: No    Family History  Problem Relation Age of Onset  . Hypertension Mother   . Stroke Father   . Heart disease Maternal Grandfather     Allergies  Allergen Reactions  . Penicillins     Unknown    Medication list has been reviewed and updated.  Current Outpatient Prescriptions on File Prior to Visit  Medication Sig Dispense Refill  . EPINEPHrine (EPIPEN) 0.3 mg/0.3 mL DEVI Inject 0.3 mLs (0.3 mg total) into the muscle once. 1 Device 0  . fluticasone (FLONASE) 50 MCG/ACT nasal spray Place 2 sprays into the nose daily. 16 g 6  . meclizine (ANTIVERT) 25 MG tablet Take 1 tablet (25 mg total) by mouth 3 (three) times daily as needed for dizziness. 30 tablet 0  . meloxicam (MOBIC) 15 MG tablet TAKE 1 TABLET BY MOUTH EVERY DAY 30 tablet 2  . mometasone-formoterol (DULERA) 200-5 MCG/ACT AERO Use  2 puffs by amylase and twice a day followed by rinsing your  mouth out with water 1 Inhaler 4  . VENTOLIN HFA 108 (90 BASE) MCG/ACT inhaler INHALE 2 PUFFS INTO LUNGS EVERY 6 HOURS AS NEEDED 18 each 0   No current facility-administered medications on file prior to visit.    Review of Systems:  As per HPI, otherwise negative.    Physical Examination: Filed Vitals:   02/02/15 1431  BP: 110/74  Pulse: 93  Temp: 97.5 F (36.4 C)  Resp: 18   Filed Vitals:   02/02/15 1431  Height: 6' 3.75" (1.924 m)  Weight: 282 lb 9.6 oz (128.187 kg)   Body mass index is 34.63 kg/(m^2). Ideal Body Weight: Weight in (lb) to have BMI = 25: 203.6  GEN: WDWN, NAD, Non-toxic, A & O x 3  Dry HEENT: Atraumatic, Normocephalic. Neck supple. No masses, No LAD. Ears and Nose: No external deformity. CV: RRR, No M/G/R. No JVD. No thrill. No extra heart sounds. PULM: CTA B, no wheezes, crackles, rhonchi. No retractions. No resp. distress. No accessory muscle use. ABD: S, NT, ND, +BS. No rebound. No HSM. EXTR: No c/c/e NEURO Normal gait.  PSYCH: Normally interactive. Conversant. Not depressed or anxious appearing.  Calm demeanor.    Assessment and Plan: Gastroenteritis Fluids  Lomotil zofran Clears  Signed,  Phillips Odor, MD

## 2015-02-02 NOTE — Patient Instructions (Signed)
Viral Gastroenteritis Viral gastroenteritis is also known as stomach flu. This condition affects the stomach and intestinal tract. It can cause sudden diarrhea and vomiting. The illness typically lasts 3 to 8 days. Most people develop an immune response that eventually gets rid of the virus. While this natural response develops, the virus can make you quite ill. CAUSES  Many different viruses can cause gastroenteritis, such as rotavirus or noroviruses. You can catch one of these viruses by consuming contaminated food or water. You may also catch a virus by sharing utensils or other personal items with an infected person or by touching a contaminated surface. SYMPTOMS  The most common symptoms are diarrhea and vomiting. These problems can cause a severe loss of body fluids (dehydration) and a body salt (electrolyte) imbalance. Other symptoms may include:  Fever.  Headache.  Fatigue.  Abdominal pain. DIAGNOSIS  Your caregiver can usually diagnose viral gastroenteritis based on your symptoms and a physical exam. A stool sample may also be taken to test for the presence of viruses or other infections. TREATMENT  This illness typically goes away on its own. Treatments are aimed at rehydration. The most serious cases of viral gastroenteritis involve vomiting so severely that you are not able to keep fluids down. In these cases, fluids must be given through an intravenous line (IV). HOME CARE INSTRUCTIONS   Drink enough fluids to keep your urine clear or pale yellow. Drink small amounts of fluids frequently and increase the amounts as tolerated.  Ask your caregiver for specific rehydration instructions.  Avoid:  Foods high in sugar.  Alcohol.  Carbonated drinks.  Tobacco.  Juice.  Caffeine drinks.  Extremely hot or cold fluids.  Fatty, greasy foods.  Too much intake of anything at one time.  Dairy products until 24 to 48 hours after diarrhea stops.  You may consume probiotics.  Probiotics are active cultures of beneficial bacteria. They may lessen the amount and number of diarrheal stools in adults. Probiotics can be found in yogurt with active cultures and in supplements.  Wash your hands well to avoid spreading the virus.  Only take over-the-counter or prescription medicines for pain, discomfort, or fever as directed by your caregiver. Do not give aspirin to children. Antidiarrheal medicines are not recommended.  Ask your caregiver if you should continue to take your regular prescribed and over-the-counter medicines.  Keep all follow-up appointments as directed by your caregiver. SEEK IMMEDIATE MEDICAL CARE IF:   You are unable to keep fluids down.  You do not urinate at least once every 6 to 8 hours.  You develop shortness of breath.  You notice blood in your stool or vomit. This may look like coffee grounds.  You have abdominal pain that increases or is concentrated in one small area (localized).  You have persistent vomiting or diarrhea.  You have a fever.  The patient is a child younger than 3 months, and he or she has a fever.  The patient is a child older than 3 months, and he or she has a fever and persistent symptoms.  The patient is a child older than 3 months, and he or she has a fever and symptoms suddenly get worse.  The patient is a baby, and he or she has no tears when crying. MAKE SURE YOU:   Understand these instructions.  Will watch your condition.  Will get help right away if you are not doing well or get worse. Document Released: 12/05/2005 Document Revised: 02/27/2012 Document Reviewed: 09/21/2011   [employer] Patient Information 2015 [employer]. This information is not intended to replace advice given to you by your health care provider. Make sure you discuss any questions you have with your health care provider. Clear Liquid Diet A clear liquid diet is a short-term diet that is prescribed to provide the necessary fluid and  basic energy you need when you can have nothing else. The clear liquid diet consists of liquids or solids that will become liquid at room temperature. You should be able to see through the liquid. There are many reasons that you may be restricted to clear liquids, such as:  When you have a sudden-onset (acute) condition that occurs before or after surgery.  To help your body slowly get adjusted to food again after a long period when you were unable to have food.  Replacement of fluids when you have a diarrheal disease.  When you are going to have certain exams, such as a colonoscopy, in which instruments are inserted inside your body to look at parts of your digestive system. WHAT CAN I HAVE? A clear liquid diet does not provide all the nutrients you need. It is important to choose a variety of the following items to get as many nutrients as possible:  Vegetable juices that do not have pulp.  Fruit juices and fruit drinks that do not have pulp.  Coffee (regular or decaffeinated), tea, or soda at the discretion of your health care provider.  Clear bouillon, broth, or strained broth-based soups.  High-protein and flavored gelatins.  Sugar or honey.  Ices or frozen ice pops that do not contain milk. If you are not sure whether you can have certain items, you should ask your health care provider. You may also ask your health care provider if there are any other clear liquid options. Document Released: 12/05/2005 Document Revised: 12/10/2013 Document Reviewed: 11/01/2013 [employer] Patient Information 2015 [employer]. This information is not intended to replace advice given to you by your health care provider. Make sure you discuss any questions you have with your health care provider.  

## 2015-03-02 ENCOUNTER — Ambulatory Visit (INDEPENDENT_AMBULATORY_CARE_PROVIDER_SITE_OTHER): Payer: PRIVATE HEALTH INSURANCE

## 2015-03-02 ENCOUNTER — Ambulatory Visit (INDEPENDENT_AMBULATORY_CARE_PROVIDER_SITE_OTHER): Payer: PRIVATE HEALTH INSURANCE | Admitting: Internal Medicine

## 2015-03-02 VITALS — BP 118/82 | HR 87 | Temp 97.7°F | Resp 17 | Ht 76.0 in | Wt 285.8 lb

## 2015-03-02 DIAGNOSIS — F4321 Adjustment disorder with depressed mood: Secondary | ICD-10-CM | POA: Diagnosis not present

## 2015-03-02 DIAGNOSIS — R079 Chest pain, unspecified: Secondary | ICD-10-CM | POA: Diagnosis not present

## 2015-03-02 DIAGNOSIS — M79601 Pain in right arm: Secondary | ICD-10-CM

## 2015-03-02 DIAGNOSIS — J9801 Acute bronchospasm: Secondary | ICD-10-CM

## 2015-03-02 DIAGNOSIS — Z658 Other specified problems related to psychosocial circumstances: Secondary | ICD-10-CM

## 2015-03-02 DIAGNOSIS — K219 Gastro-esophageal reflux disease without esophagitis: Secondary | ICD-10-CM | POA: Diagnosis not present

## 2015-03-02 DIAGNOSIS — F4329 Adjustment disorder with other symptoms: Secondary | ICD-10-CM | POA: Insufficient documentation

## 2015-03-02 DIAGNOSIS — T7840XA Allergy, unspecified, initial encounter: Secondary | ICD-10-CM | POA: Insufficient documentation

## 2015-03-02 DIAGNOSIS — F439 Reaction to severe stress, unspecified: Secondary | ICD-10-CM

## 2015-03-02 DIAGNOSIS — T7840XD Allergy, unspecified, subsequent encounter: Secondary | ICD-10-CM

## 2015-03-02 DIAGNOSIS — Z634 Disappearance and death of family member: Secondary | ICD-10-CM

## 2015-03-02 LAB — POCT CBC
Granulocyte percent: 63.8 %G (ref 37–80)
HCT, POC: 45.6 % (ref 43.5–53.7)
HEMOGLOBIN: 14.9 g/dL (ref 14.1–18.1)
Lymph, poc: 2.1 (ref 0.6–3.4)
MCH: 26.7 pg — AB (ref 27–31.2)
MCHC: 32.6 g/dL (ref 31.8–35.4)
MCV: 82 fL (ref 80–97)
MID (CBC): 0.5 (ref 0–0.9)
MPV: 6.6 fL (ref 0–99.8)
PLATELET COUNT, POC: 268 10*3/uL (ref 142–424)
POC Granulocyte: 4.7 (ref 2–6.9)
POC LYMPH PERCENT: 28.7 %L (ref 10–50)
POC MID %: 7.5 % (ref 0–12)
RBC: 5.57 M/uL (ref 4.69–6.13)
RDW, POC: 14.6 %
WBC: 7.3 10*3/uL (ref 4.6–10.2)

## 2015-03-02 LAB — POCT SEDIMENTATION RATE: POCT SED RATE: 17 mm/h (ref 0–22)

## 2015-03-02 MED ORDER — HYDROCODONE-ACETAMINOPHEN 7.5-325 MG/15ML PO SOLN: 10.0000 mL | Freq: Three times a day (TID) | ORAL | Status: DC

## 2015-03-02 MED ORDER — AZITHROMYCIN 500 MG PO TABS: 500.0000 mg | ORAL_TABLET | Freq: Every day | ORAL | Status: DC

## 2015-03-02 MED ORDER — OMEPRAZOLE 40 MG PO CPDR: 40.0000 mg | DELAYED_RELEASE_CAPSULE | Freq: Every day | ORAL | Status: DC

## 2015-03-02 MED ORDER — GI COCKTAIL ~~LOC~~
30.0000 mL | Freq: Once | ORAL | Status: AC
  Administered 2015-03-02: 30 mL via ORAL

## 2015-03-02 MED ORDER — ALBUTEROL SULFATE HFA 108 (90 BASE) MCG/ACT IN AERS: 2.0000 | INHALATION_SPRAY | Freq: Four times a day (QID) | RESPIRATORY_TRACT | Status: DC | PRN

## 2015-03-02 NOTE — Addendum Note (Signed)
Addended by: Isaac BlissGALLOWAY, Laurie Penado J on: 03/02/2015 03:32 PM   Modules accepted: Orders

## 2015-03-02 NOTE — Progress Notes (Signed)
Subjective: Spoke with patient at request of Dr. Perrin Maltese.  Patient reports losing his mother in December of 15, and reports he was at the hospital enough while she was admitted, and was not there when she passed.  States that the care team initially felt she would be fine initially, but patient knew she would not pull through, as she had been in poor health and chronic pain for years now, to which he states "I think she is in a better place now."  He feels extremely guilty over this, buts reports that his wife told him he had be handling the grief particularly well.   He reports generally being a happy person, and denies anhedonia.  He reports poor sleep, but he has always been this way.  He goes to be at roughly 10:30 at night, and will wake up about an hour later and be up for five minutes, and then is able to go back to sleep.  He does not complain of low energy.  He has a lot of guilt surrounding the death of his mother, but denies hopelessness.  He denies SI and HI at this time, however, shortly after his mother's death he did cut his left anterior forearm and did not do this with the intent to take his own life, but to "get the some of the pain out."  He was [facility] at [facility] and signed a contract with the psychiatric provider saying that he would call if he felt suicidal or felt as if he should cut himself again.  He denies psychomotor slowing, changes in concentration and appetite.   He reports a ~10 year history of panic attacks, the most recent of which was this morning, in which he felt he was going to die.  He reports feeling right arm pain, palpitations, and facial numbness with this episode.  He reports he has never taken medicine for this, and this if confirmed in Epic.  He reports having roughly 1 panic attack a week since his mother's death.  He says his mother had similar attacks and did not need medication, as she found ways to deal with this on her own.  He want's to continue  to try and "find his own way" with regard to these attacks and would like to avoid psychotropics for now.    Objective:  Mental Status Exam:  Appearance: Fairly Groomed and dressed in hospital gown, there is a ~4 cm well healed laceration on the left anterior forearm.  Often seen crying. Eye Contact::  Fair to minimal at times.  Psychomotor:  Restlessness Speech:  Clear and Coherent and Normal Rate Volume: Normal to decreased at times. Mood:  "Okay" Affect:  Non-Congruent and Tearful Thought Process:  Coherent, Linear and Logical Orientation:  Full (Time, Place, and Person) Thought Content:  Positive for rumination with regards to his mother's death. Suicidal Thoughts:  No Homicidal Thoughts:  No Judgement:  Good Insight:  Poor  Assessment and plan:  Patient with history of anxiety and depression, previously un medicated. Today he presents with a complicated grief picture, evidenced by his extreme guilt regarding his mother's death, along with recent self mutilating behavior.  He does not fit the criteria for major depressive disorder at this time, however I do believe his psychiatric exam is guarded today.  He is amenable to bi weekly talk therapy as long as it is in the afternoon. Patient is to follow up in two weeks for the reassessment of this  problem.    Russell [name] was seen today for chest pain.  Diagnoses and all orders for this visit:   Complicated grief Orders: -     Ambulatory referral to Psychology.  I will evaluate his response to this in at two weeks f/u.  Given patient's previous diagnosis of Anxiety and Depression, I would like to start an SSRI on the patient, most likely Prozac 20 mg qd, if he is amenable in the future.

## 2015-03-02 NOTE — Addendum Note (Signed)
Addended by: Isaac BlissGALLOWAY, Laityn Bensen J on: 03/02/2015 03:48 PM   Modules accepted: Orders

## 2015-03-02 NOTE — Patient Instructions (Signed)
Stress Stress-related medical problems are becoming increasingly common. The body has a built-in physical response to stressful situations. Faced with pressure, challenge or danger, we need to react quickly. Our bodies release hormones such as cortisol and adrenaline to help do this. These hormones are part of the "fight or flight" response and affect the metabolic rate, heart rate and blood pressure, resulting in a heightened, stressed state that prepares the body for optimum performance in dealing with a stressful situation. It is likely that early man required these mechanisms to stay alive, but usually modern stresses do not call for this, and the same hormones released in today's world can damage health and reduce coping ability. CAUSES  Pressure to perform at work, at school or in sports.  Threats of physical violence.  Money worries.  Arguments.  Family conflicts.  Divorce or separation from significant other.  Bereavement.  New job or unemployment.  Changes in location.  Alcohol or drug abuse. SOMETIMES, THERE IS NO PARTICULAR REASON FOR DEVELOPING STRESS. Almost all people are at risk of being stressed at some time in their lives. It is important to know that some stress is temporary and some is long term.  Temporary stress will go away when a situation is resolved. Most people can cope with short periods of stress, and it can often be relieved by relaxing, taking a walk or getting any type of exercise, chatting through issues with friends, or having a good night's sleep.  Chronic (long-term, continuous) stress is much harder to deal with. It can be psychologically and emotionally damaging. It can be harmful both for an individual and for friends and family. SYMPTOMS Everyone reacts to stress differently. There are some common effects that help us recognize it. In times of extreme stress, people may:  Shake uncontrollably.  Breathe faster and deeper than normal  (hyperventilate).  Vomit.  For people with asthma, stress can trigger an attack.  For some people, stress may trigger migraine headaches, ulcers, and body pain. PHYSICAL EFFECTS OF STRESS MAY INCLUDE:  Loss of energy.  Skin problems.  Aches and pains resulting from tense muscles, including neck ache, backache and tension headaches.  Increased pain from arthritis and other conditions.  Irregular heart beat (palpitations).  Periods of irritability or anger.  Apathy or depression.  Anxiety (feeling uptight or worrying).  Unusual behavior.  Loss of appetite.  Comfort eating.  Lack of concentration.  Loss of, or decreased, sex-drive.  Increased smoking, drinking, or recreational drug use.  For women, missed periods.  Ulcers, joint pain, and muscle pain. Post-traumatic stress is the stress caused by any serious accident, strong emotional damage, or extremely difficult or violent experience such as rape or war. Post-traumatic stress victims can experience mixtures of emotions such as fear, shame, depression, guilt or anger. It may include recurrent memories or images that may be haunting. These feelings can last for weeks, months or even years after the traumatic event that triggered them. Specialized treatment, possibly with medicines and psychological therapies, is available. If stress is causing physical symptoms, severe distress or making it difficult for you to function as normal, it is worth seeing your caregiver. It is important to remember that although stress is a usual part of life, extreme or prolonged stress can lead to other illnesses that will need treatment. It is better to visit a doctor sooner rather than later. Stress has been linked to the development of high blood pressure and heart disease, as well as insomnia and depression.   There is no diagnostic test for stress since everyone reacts to it differently. But a caregiver will be able to spot the physical  symptoms, such as:  Headaches.  Shingles.  Ulcers. Emotional distress such as intense worry, low mood or irritability should be detected when the doctor asks pertinent questions to identify any underlying problems that might be the cause. In case there are physical reasons for the symptoms, the doctor may also want to do some tests to exclude certain conditions. If you feel that you are suffering from stress, try to identify the aspects of your life that are causing it. Sometimes you may not be able to change or avoid them, but even a small change can have a positive ripple effect. A simple lifestyle change can make all the difference. STRATEGIES THAT CAN HELP DEAL WITH STRESS:  Delegating or sharing responsibilities.  Avoiding confrontations.  Learning to be more assertive.  Regular exercise.  Avoid using alcohol or street drugs to cope.  Eating a healthy, balanced diet, rich in fruit and vegetables and proteins.  Finding humor or absurdity in stressful situations.  Never taking on more than you know you can handle comfortably.  Organizing your time better to get as much done as possible.  Talking to friends or family and sharing your thoughts and fears.  Listening to music or relaxation tapes.  Relaxation techniques like deep breathing, meditation, and yoga.  Tensing and then relaxing your muscles, starting at the toes and working up to the head and neck. If you think that you would benefit from help, either in identifying the things that are causing your stress or in learning techniques to help you relax, see a caregiver who is capable of helping you with this. Rather than relying on medications, it is usually better to try and identify the things in your life that are causing stress and try to deal with them. There are many techniques of managing stress including counseling, psychotherapy, aromatherapy, yoga, and exercise. Your caregiver can help you determine what is best  for you. Document Released: 02/25/2003 Document Revised: 12/10/2013 Document Reviewed: 01/22/2008 [facility] Patient Information 2015 [place], [place]. This information is not intended to replace advice given to you by your health care provider. Make sure you discuss any questions you have with your health care provider. Gastroesophageal Reflux Disease, Adult Gastroesophageal reflux disease (GERD) happens when acid from your stomach flows up into the esophagus. When acid comes in contact with the esophagus, the acid causes soreness (inflammation) in the esophagus. Over time, GERD may create small holes (ulcers) in the lining of the esophagus. CAUSES   Increased body weight. This puts pressure on the stomach, making acid rise from the stomach into the esophagus.  Smoking. This increases acid production in the stomach.  Drinking alcohol. This causes decreased pressure in the lower esophageal sphincter (valve or ring of muscle between the esophagus and stomach), allowing acid from the stomach into the esophagus.  Late evening meals and a full stomach. This increases pressure and acid production in the stomach.  A malformed lower esophageal sphincter. Sometimes, no cause is found. SYMPTOMS   Burning pain in the lower part of the mid-chest behind the breastbone and in the mid-stomach area. This may occur twice a week or more often.  Trouble swallowing.  Sore throat.  Dry cough.  Asthma-like symptoms including chest tightness, shortness of breath, or wheezing. DIAGNOSIS  Your caregiver may be able to diagnose GERD based on your symptoms. In some cases,  X-rays and other tests may be done to check for complications or to check the condition of your stomach and esophagus. TREATMENT  Your caregiver may recommend over-the-counter or prescription medicines to help decrease acid production. Ask your caregiver before starting or adding any new medicines.  HOME CARE INSTRUCTIONS   Change the factors  that you can control. Ask your caregiver for guidance concerning weight loss, quitting smoking, and alcohol consumption.  Avoid foods and drinks that make your symptoms worse, such as:  Caffeine or alcoholic drinks.  Chocolate.  Peppermint or mint flavorings.  Garlic and onions.  Spicy foods.  Citrus fruits, such as oranges, lemons, or limes.  Tomato-based foods such as sauce, chili, salsa, and pizza.  Fried and fatty foods.  Avoid lying down for the 3 hours prior to your bedtime or prior to taking a nap.  Eat small, frequent meals instead of large meals.  Wear loose-fitting clothing. Do not wear anything tight around your waist that causes pressure on your stomach.  Raise the head of your bed 6 to 8 inches with wood blocks to help you sleep. Extra pillows will not help.  Only take over-the-counter or prescription medicines for pain, discomfort, or fever as directed by your caregiver.  Do not take aspirin, ibuprofen, or other nonsteroidal anti-inflammatory drugs (NSAIDs). SEEK IMMEDIATE MEDICAL CARE IF:   You have pain in your arms, neck, jaw, teeth, or back.  Your pain increases or changes in intensity or duration.  You develop nausea, vomiting, or sweating (diaphoresis).  You develop shortness of breath, or you faint.  Your vomit is green, yellow, black, or looks like coffee grounds or blood.  Your stool is red, bloody, or black. These symptoms could be signs of other problems, such as heart disease, gastric bleeding, or esophageal bleeding. MAKE SURE YOU:   Understand these instructions.  Will watch your condition.  Will get help right away if you are not doing well or get worse. Document Released: 09/14/2005 Document Revised: 02/27/2012 Document Reviewed: 06/24/2011 [facility] Patient Information 2015 [place], [place]. This information is not intended to replace advice given to you by your health care provider. Make sure you discuss any questions you have with  your health care provider.

## 2015-03-02 NOTE — Progress Notes (Signed)
   Subjective:    Patient ID: Russell [name], male    DOB: [birth year], 24 y.o.   MRN: [id number]  HPI Mr. Sunny Schleinwooten is 24 yo co cp substernal, comes and goes, assoc with right arm pain. No syncope, diaphoresis. Has asthma, is wheezing, has cough, also lots of stress, mother died, moved in with father, wife and 89 yo. Has hx of stress, panic attacks with counseling and meds in past. Sherril CroonMike Clark PAc is doing further psych hx and tx planning. Also has hx of GERD sxs.   Review of Systems  Constitutional: Positive for fatigue. Negative for diaphoresis, activity change and unexpected weight change.  HENT: Negative.   Eyes: Negative.   Respiratory: Positive for cough, chest tightness, shortness of breath and wheezing. Negative for apnea, choking and stridor.   Cardiovascular: Positive for chest pain. Negative for palpitations and leg swelling.  Gastrointestinal: Negative.   Endocrine: Negative.   Genitourinary: Negative.   Musculoskeletal: Negative.   Skin: Negative.   Allergic/Immunologic: Positive for environmental allergies.  Neurological: Positive for dizziness. Negative for tremors, seizures, syncope, facial asymmetry, speech difficulty, weakness, light-headedness, numbness and headaches.  Hematological: Negative.   Psychiatric/Behavioral: Positive for sleep disturbance, dysphoric mood and decreased concentration. Negative for suicidal ideas, hallucinations, behavioral problems, confusion and agitation. The patient is nervous/anxious. The patient is not hyperactive.        Objective:   Physical Exam  Constitutional: He is oriented to person, place, and time. He appears well-nourished. He appears distressed.  HENT:  Head: Normocephalic.  Mouth/Throat: Oropharynx is clear and moist.  Eyes: EOM are normal.  Neck: Normal range of motion.  Cardiovascular: Normal rate, regular rhythm and normal heart sounds.   Pulmonary/Chest: Effort normal. No respiratory distress. He has wheezes. He has no  rales. He exhibits tenderness.  Abdominal: Soft. There is no tenderness.  Musculoskeletal: Normal range of motion.  Lymphadenopathy:    He has no cervical adenopathy.  Neurological: He is alert and oriented to person, place, and time. He has normal reflexes. No cranial nerve deficit. He exhibits normal muscle tone. Coordination normal.  Psychiatric: He has a normal mood and affect. His behavior is normal.   [facility] reading (PRIMARY) by  Dr.Isamar Wellbrock  EKG is normal  Results for orders placed or performed in visit on 03/02/15  POCT CBC  Result Value Ref Range   WBC 7.3 4.6 - 10.2 K/uL   Lymph, poc 2.1 0.6 - 3.4   POC LYMPH PERCENT 28.7 10 - 50 %L   MID (cbc) 0.5 0 - 0.9   POC MID % 7.5 0 - 12 %M   POC Granulocyte 4.7 2 - 6.9   Granulocyte percent 63.8 37 - 80 %G   RBC 5.57 4.69 - 6.13 M/uL   Hemoglobin 14.9 14.1 - 18.1 g/dL   HCT, POC 10.245.6 72.543.5 - 53.7 %   MCV 82.0 80 - 97 fL   MCH, POC 26.7 (A) 27 - 31.2 pg   MCHC 32.6 31.8 - 35.4 g/dL   RDW, POC 36.614.6 %   Platelet Count, POC 268 142 - 424 K/uL   MPV 6.6 0 - 99.8 fL   Trial GI coctail relieved his pain  [facility] reading (PRIMARY) by  Dr Perrin MalteseGuest cxr is normal       Assessment & Plan:  Stress/maybe depression Asthma active Chest pain/Bronchitis/GERD F/up Deliah BostonMichael Clark PAc

## 2015-03-10 ENCOUNTER — Ambulatory Visit
Admission: RE | Admit: 2015-03-10 | Discharge: 2015-03-10 | Disposition: A | Discharge status: Home / Self Care | Payer: PRIVATE HEALTH INSURANCE | Source: Ambulatory Visit | Attending: Internal Medicine | Admitting: Internal Medicine

## 2015-03-10 DIAGNOSIS — M79601 Pain in right arm: Secondary | ICD-10-CM

## 2015-03-10 DIAGNOSIS — R079 Chest pain, unspecified: Secondary | ICD-10-CM

## 2015-03-10 MED ORDER — IOPAMIDOL (ISOVUE-300) INJECTION 61%
75.0000 mL | Freq: Once | INTRAVENOUS | Status: AC | PRN
  Administered 2015-03-10: 75 mL via INTRAVENOUS

## 2015-03-16 ENCOUNTER — Telehealth: Payer: Self-pay

## 2015-03-16 NOTE — Telephone Encounter (Signed)
Patient called to ask for his CT results.  He also expressed concern about tinnitus.  He said he has had problems for years, but it has become increasingly irritating recently.  He wants to know if there are any at-home treatments he can do for the ringing in the ears.  Please advise.  Thank you.  CB#: [phone]

## 2015-03-17 ENCOUNTER — Ambulatory Visit (INDEPENDENT_AMBULATORY_CARE_PROVIDER_SITE_OTHER): Payer: PRIVATE HEALTH INSURANCE | Admitting: Physician Assistant

## 2015-03-17 VITALS — BP 122/80 | HR 89 | Temp 97.6°F | Resp 17 | Ht 76.0 in | Wt 287.0 lb

## 2015-03-17 DIAGNOSIS — H9313 Tinnitus, bilateral: Secondary | ICD-10-CM

## 2015-03-17 DIAGNOSIS — R42 Dizziness and giddiness: Secondary | ICD-10-CM

## 2015-03-17 DIAGNOSIS — H6983 Other specified disorders of Eustachian tube, bilateral: Secondary | ICD-10-CM

## 2015-03-17 DIAGNOSIS — H6980 Other specified disorders of Eustachian tube, unspecified ear: Secondary | ICD-10-CM | POA: Insufficient documentation

## 2015-03-17 DIAGNOSIS — H6993 Unspecified Eustachian tube disorder, bilateral: Secondary | ICD-10-CM

## 2015-03-17 DIAGNOSIS — H6990 Unspecified Eustachian tube disorder, unspecified ear: Secondary | ICD-10-CM | POA: Insufficient documentation

## 2015-03-17 MED ORDER — PSEUDOEPHEDRINE HCL ER 120 MG PO TB12: 120.0000 mg | ORAL_TABLET | Freq: Every day | ORAL | Status: DC

## 2015-03-17 MED ORDER — MECLIZINE HCL 25 MG PO TABS
25.0000 mg | ORAL_TABLET | Freq: Three times a day (TID) | ORAL | Status: DC | PRN
Start: 2015-03-17 — End: 2015-07-12

## 2015-03-17 MED ORDER — FLUTICASONE PROPIONATE 50 MCG/ACT NA SUSP: 2.0000 | Freq: Every day | NASAL | Status: DC

## 2015-03-17 MED ORDER — CETIRIZINE HCL 10 MG PO TABS: 10.0000 mg | ORAL_TABLET | Freq: Every day | ORAL | Status: DC

## 2015-03-17 NOTE — Progress Notes (Signed)
  Medical screening examination/treatment/procedure(s) were performed by non-physician practitioner and as supervising physician I was immediately available for consultation/collaboration.     

## 2015-03-17 NOTE — Patient Instructions (Signed)
Use flonase daily. Use zyrtec daily. Take sudafed in the mornings. You will get a phone call to make appointment with ENT. Return with any problems/concerns.

## 2015-03-17 NOTE — Progress Notes (Signed)
Subjective:    Patient ID: Russell [name], male    DOB: 10-Feb-1991, 24 y.o.   MRN: [id number]  HPI  This is a 24 year old male with PMH asthma, allergic rhinitis and vertigo who is presenting with ringing in his ears x 2 years. 4 days ago the ringing intensified. Left ear is worse than right - always been that way. States right ear almost stopped after cleaning it out and "a big gunk of ear wax came out". States his anxiety is worsening because of the ringing. Ringing worse at night. He feels his allergies have been worse the past few days since the pollen came out. He takes OTC allergy medication and flonase inconsistently. He has been told before he has ETD and was referred to ENT. He was told tube placement might be a possibility for him. This worried him and never made the appointment with ENT. He had tonsils removed 2 years ago. He finished a course of zithromax 1.5 weeks ago for a respiratory infection. He does not take NSAIDs or other ototoxic medications regularly.  Pt is requesting a refill of antivert for vertigo. States he takes it prn about once a month.  Review of Systems  Constitutional: Negative for fever and chills.  HENT: Positive for congestion and tinnitus. Negative for ear pain, sinus pressure, sore throat and voice change.   Eyes: Negative for visual disturbance.  Respiratory: Negative for cough, shortness of breath and wheezing.   Gastrointestinal: Negative for nausea and vomiting.  Skin: Negative for rash.  Allergic/Immunologic: Positive for environmental allergies.  Neurological: Positive for dizziness.  Hematological: Negative for adenopathy.  Psychiatric/Behavioral: The patient is nervous/anxious.    Patient Active Problem List   Diagnosis Date Noted  . Bronchospasm 03/02/2015  . Allergy 03/02/2015  . 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 BMI 36.0-36.9,adult 11/02/2014   Prior to Admission medications   Medication Sig Start Date End Date Taking? Authorizing  Provider  albuterol (VENTOLIN HFA) 108 (90 BASE) MCG/ACT inhaler Inhale 2 puffs into the lungs every 6 (six) hours as needed for wheezing or shortness of breath. 03/02/15  Yes Jonita Albeehris W Guest, MD  EPINEPHrine (EPIPEN) 0.3 mg/0.3 mL DEVI Inject 0.3 mLs (0.3 mg total) into the muscle once. 07/11/12  Yes Heather M Marte, PA-C  fluticasone (FLONASE) 50 MCG/ACT nasal spray Place 2 sprays into both nostrils daily. 03/17/15  Yes Lanier ClamNicole Nataliyah Packham V, PA-C  loperamide (IMODIUM A-D) 2 MG tablet 2 now and one hourly prn diarrhea.  Max 8 tabs in 24 hours 02/02/15  Yes Carmelina DaneJeffery S Anderson, MD  meclizine (ANTIVERT) 25 MG tablet Take 1 tablet (25 mg total) by mouth 3 (three) times daily as needed for dizziness. 03/17/15  Yes Lanier ClamNicole Kailon Treese V, PA-C  mometasone-formoterol (DULERA) 200-5 MCG/ACT AERO Use 2 puffs by amylase and twice a day followed by rinsing your  mouth out with water 11/02/13  Yes Collene GobbleSteven A Daub, MD  omeprazole (PRILOSEC) 40 MG capsule Take 1 capsule (40 mg total) by mouth daily. 03/02/15  Yes Jonita Albeehris W Guest, MD  ondansetron (ZOFRAN-ODT) 8 MG disintegrating tablet Take 1 tablet (8 mg total) by mouth every 8 (eight) hours as needed for nausea. 02/02/15  Yes Carmelina DaneJeffery S Anderson, MD                 Allergies  Allergen Reactions  . Penicillins     Unknown   Patient's social and family history were reviewed.     Objective:   Physical Exam  Constitutional: He is oriented to person, place, and time. He appears well-developed and well-nourished. No distress.  HENT:  Head: Normocephalic and atraumatic.  Right Ear: Hearing, external ear and ear canal normal. Tympanic membrane is retracted.  Left Ear: Hearing, external ear and ear canal normal. Tympanic membrane is retracted.  Nose: Mucosal edema present. Right sinus exhibits no maxillary sinus tenderness and no frontal sinus tenderness. Left sinus exhibits no maxillary sinus tenderness and no frontal sinus tenderness.  Mouth/Throat: Uvula is midline and mucous membranes  are normal. Posterior oropharyngeal erythema present. No oropharyngeal exudate or posterior oropharyngeal edema.  Nasal mucosa pale and boggy Does not have tonsils  Eyes: Conjunctivae and lids are normal. Right eye exhibits no discharge. Left eye exhibits no discharge. No scleral icterus.  Cardiovascular: Normal rate, regular rhythm, normal heart sounds and normal pulses.   No murmur heard. Pulmonary/Chest: Effort normal and breath sounds normal. No respiratory distress. He has no wheezes. He has no rhonchi. He has no rales.  Musculoskeletal: Normal range of motion.  Lymphadenopathy:       Head (right side): No submental, no submandibular and no tonsillar adenopathy present.       Head (left side): No submental, no submandibular and no tonsillar adenopathy present.    He has no cervical adenopathy.  Neurological: He is alert and oriented to person, place, and time.  Skin: Skin is warm, dry and intact. No lesion and no rash noted.  Psychiatric: He has a normal mood and affect. His speech is normal and behavior is normal. Thought content normal.   BP 122/80 mmHg  Pulse 89  Temp(Src) 97.6 F (36.4 C) (Oral)  Resp 17  Ht  (1.93 m)  Wt 287 lb (130.182 kg)  BMI 34.95 kg/m2  SpO2 98%     Assessment & Plan:  1. Eustachian tube dysfunction, bilateral 2. Tinnitus, bilateral He will start taking zyrtec and flonase consistently. Sudafed in the mornings for next 30 days or until symptoms improve. Referral to ENT for further evaluation since tinnitus has been going on for 2 years. No ototoxic medications on board currently. He will return with further problems/concerns.  - cetirizine (ZYRTEC) 10 MG tablet; Take 1 tablet (10 mg total) by mouth daily.  Dispense: 30 tablet; Refill: 11 - pseudoephedrine (SUDAFED 12 HOUR) 120 MG 12 hr tablet; Take 1 tablet (120 mg total) by mouth daily.  Dispense: 30 tablet; Refill: 0 - fluticasone (FLONASE) 50 MCG/ACT nasal spray; Place 2 sprays into both  nostrils daily.  Dispense: 16 g; Refill: 11 - Ambulatory referral to ENT  3. Dizziness - meclizine (ANTIVERT) 25 MG tablet; Take 1 tablet (25 mg total) by mouth 3 (three) times daily as needed for dizziness.  Dispense: 30 tablet; Refill: 0   [place] [employer]. Dyke Brackett, MHS [facility]  03/17/2015

## 2015-03-17 NOTE — Telephone Encounter (Signed)
CT looks normal. Advise on Tinnuitis.

## 2015-03-18 NOTE — Telephone Encounter (Signed)
Tinnitus was addressed at [facility] yesterday. Prescibed sudafed, flonase and zyrtec for ETD and referred to ENT.

## 2015-03-23 ENCOUNTER — Telehealth: Payer: Self-pay | Admitting: *Deleted

## 2015-03-23 NOTE — Telephone Encounter (Signed)
No answer, voicemail not set up yet 

## 2015-03-23 NOTE — Telephone Encounter (Signed)
Pt left message in regards to lab results. Please advise

## 2015-03-23 NOTE — Telephone Encounter (Signed)
Pt must be referring to labs drawn at [facility] with Philis Fendt. CBC and ESR normal. If pt has questions about that visit please defer to Legrand Como.

## 2015-04-09 ENCOUNTER — Encounter: Payer: Self-pay | Admitting: Physician Assistant

## 2015-04-09 DIAGNOSIS — H9319 Tinnitus, unspecified ear: Secondary | ICD-10-CM | POA: Insufficient documentation

## 2015-05-01 ENCOUNTER — Ambulatory Visit (INDEPENDENT_AMBULATORY_CARE_PROVIDER_SITE_OTHER): Payer: PRIVATE HEALTH INSURANCE | Admitting: Emergency Medicine

## 2015-05-01 VITALS — BP 118/78 | HR 99 | Temp 98.9°F | Resp 12 | Ht 75.25 in | Wt 288.0 lb

## 2015-05-01 DIAGNOSIS — H6502 Acute serous otitis media, left ear: Secondary | ICD-10-CM | POA: Diagnosis not present

## 2015-05-01 DIAGNOSIS — H6122 Impacted cerumen, left ear: Secondary | ICD-10-CM | POA: Diagnosis not present

## 2015-05-01 MED ORDER — PSEUDOEPHEDRINE-GUAIFENESIN ER 60-600 MG PO TB12
1.0000 | ORAL_TABLET | Freq: Two times a day (BID) | ORAL | Status: DC
Start: 2015-05-01 — End: 2015-06-01

## 2015-05-01 NOTE — Progress Notes (Signed)
[facility] [address], [place] [id number] [phone]  Date:  05/01/2015   Name:  Russell [name]   DOB:  05-28-1991   MRN:  [id number]  PCP:  Tonye PearsonOLITTLE, ROBERT P, MD    Chief Complaint: Otalgia and Tinnitus   History of Present Illness:  Russell [name] is a 24 y.o. very pleasant male patient who presents with the following:  Has sensation of pressure and decreased hearing left ear Tried debrox last night with no improvement No coryza No drainage No cough No fever or chills No improvement with over the counter medications or other home remedies.  Denies other complaint or health concern today.   Patient Active Problem List   Diagnosis Date Noted  . Tinnitus 04/09/2015  . Bronchospasm 03/02/2015  . Allergy 03/02/2015  . 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 BMI 36.0-36.9,adult 11/02/2014    Past Medical History  Diagnosis Date  . Asthma   . Obesity   . Allergy   . Anxiety     Past Surgical History  Procedure Laterality Date  . Tonsillectomy      History  Substance Use Topics  . Smoking status: Never Smoker   . Smokeless tobacco: Never Used  . Alcohol Use: No    Family History  Problem Relation Age of Onset  . Hypertension Mother   . Stroke Father   . Heart disease Maternal Grandfather     Allergies  Allergen Reactions  . Penicillins     Unknown    Medication list has been reviewed and updated.  Current Outpatient Prescriptions on File Prior to Visit  Medication Sig Dispense Refill  . albuterol (VENTOLIN HFA) 108 (90 BASE) MCG/ACT inhaler Inhale 2 puffs into the lungs every 6 (six) hours as needed for wheezing or shortness of breath. 18 each 6  . cetirizine (ZYRTEC) 10 MG tablet Take 1 tablet (10 mg total) by mouth daily. 30 tablet 11  . EPINEPHrine (EPIPEN) 0.3 mg/0.3 mL DEVI Inject 0.3 mLs (0.3 mg total) into the muscle once. 1 Device 0  . fluticasone (FLONASE) 50 MCG/ACT nasal spray Place 2 sprays into both nostrils  daily. 16 g 11  . loperamide (IMODIUM A-D) 2 MG tablet 2 now and one hourly prn diarrhea.  Max 8 tabs in 24 hours 30 tablet 0  . meclizine (ANTIVERT) 25 MG tablet Take 1 tablet (25 mg total) by mouth 3 (three) times daily as needed for dizziness. 30 tablet 0  . mometasone-formoterol (DULERA) 200-5 MCG/ACT AERO Use 2 puffs by amylase and twice a day followed by rinsing your  mouth out with water 1 Inhaler 4  . omeprazole (PRILOSEC) 40 MG capsule Take 1 capsule (40 mg total) by mouth daily. 30 capsule 3  . ondansetron (ZOFRAN-ODT) 8 MG disintegrating tablet Take 1 tablet (8 mg total) by mouth every 8 (eight) hours as needed for nausea. 30 tablet 0  . pseudoephedrine (SUDAFED 12 HOUR) 120 MG 12 hr tablet Take 1 tablet (120 mg total) by mouth daily. 30 tablet 0   No current facility-administered medications on file prior to visit.    Review of Systems:  Review of Systems  Constitutional: Negative for fever, chills and fatigue.  HENT: Negative for congestion, ear pain, hearing loss, postnasal drip, rhinorrhea and sinus pressure.   Eyes: Negative for discharge and redness.  Respiratory: Negative for cough, shortness of breath and wheezing.   Cardiovascular: Negative for chest pain and leg swelling.  Gastrointestinal: Negative for nausea,  vomiting, abdominal pain, constipation and blood in stool.  Genitourinary: Negative for dysuria, urgency and frequency.  Musculoskeletal: Negative for neck stiffness.  Skin: Negative for rash.  Neurological: Negative for seizures, weakness and headaches.   Physical Examination: Filed Vitals:   05/01/15 1815  BP: 118/78  Pulse: 99  Temp: 98.9 F (37.2 C)  Resp: 12   Filed Vitals:   05/01/15 1815  Height: 6' 3.25" (1.911 m)  Weight: 288 lb (130.636 kg)   Body mass index is 35.77 kg/(m^2). Ideal Body Weight: Weight in (lb) to have BMI = 25: 200.9   GEN: WDWN, NAD, Non-toxic, Alert & Oriented x 3 HEENT: Atraumatic, Normocephalic.  Ears and Nose: No  external deformity.  Left ear cerumen impaction right normal TM.  Left serous otitis media EXTR: No clubbing/cyanosis/edema NEURO: Normal gait.  PSYCH: Normally interactive. Conversant. Not depressed or anxious appearing.  Calm demeanor.    Assessment and Plan: 1. Cerumen impaction, left Atraumatic irrigation.  2.  Serous otitis media left mucinex d  Signed Phillips OdorJeffery Anderson, MD

## 2015-05-01 NOTE — Patient Instructions (Signed)
Cerumen Impaction °A cerumen impaction is when the wax in your ear forms a plug. This plug usually causes reduced hearing. Sometimes it also causes an earache or dizziness. Removing a cerumen impaction can be difficult and painful. The wax sticks to the ear canal. The canal is sensitive and bleeds easily. If you try to remove a heavy wax buildup with a cotton tipped swab, you may push it in further. °Irrigation with water, suction, and small ear curettes may be used to clear out the wax. If the impaction is fixed to the skin in the ear canal, ear drops may be needed for a few days to loosen the wax. People who build up a lot of wax frequently can use ear wax removal products available in your local drugstore. °SEEK MEDICAL CARE IF:  °You develop an earache, increased hearing loss, or marked dizziness. °Document Released: 01/12/2005 Document Revised: 02/27/2012 Document Reviewed: 03/04/2010 °ExitCare® Patient Information ©2015 [employer]. This information is not intended to replace advice given to you by your health care provider. Make sure you discuss any questions you have with your health care provider. ° °

## 2015-05-19 ENCOUNTER — Telehealth: Payer: Self-pay

## 2015-05-19 NOTE — Telephone Encounter (Signed)
Pt wants to know if he can take sudafed dm for the fluid in his ears

## 2015-05-20 NOTE — Telephone Encounter (Signed)
I do not see any allergies or medical conditions on his history that would prevent him from taking this. I also see regular sudafed on his medication list. I don't see any issues. Is there a reason he thought there might be an issue?

## 2015-05-21 NOTE — Telephone Encounter (Signed)
Tried to call pt, no voicemail set-up. °

## 2015-05-26 ENCOUNTER — Ambulatory Visit (INDEPENDENT_AMBULATORY_CARE_PROVIDER_SITE_OTHER): Payer: PRIVATE HEALTH INSURANCE | Admitting: Emergency Medicine

## 2015-05-26 VITALS — BP 118/68 | HR 88 | Temp 97.9°F | Resp 17 | Ht 77.0 in | Wt 288.0 lb

## 2015-05-26 DIAGNOSIS — F418 Other specified anxiety disorders: Secondary | ICD-10-CM | POA: Diagnosis not present

## 2015-05-26 MED ORDER — LORAZEPAM 1 MG PO TABS: 1.0000 mg | ORAL_TABLET | Freq: Three times a day (TID) | ORAL | Status: DC | PRN

## 2015-05-26 MED ORDER — PAROXETINE HCL 20 MG PO TABS: 20.0000 mg | ORAL_TABLET | Freq: Every day | ORAL | Status: DC

## 2015-05-26 NOTE — Patient Instructions (Signed)

## 2015-05-26 NOTE — Progress Notes (Signed)
Subjective:  Patient ID: Russell [name], male    DOB: Aug 18, 1991  Age: 24 y.o. MRN: [id number]  CC: Anxiety; Panic Attack; Fatigue; and Medication Refill   HPI BECKETT MADEN presents  for evaluation of depression and anxiety. He said that he lost his mother in December. He he lives at home with his father. Since his mother's death unexpectedly As a Complication of Surgery He's Had Difficulty Sleeping, Loss of Appetite and increasing anxiety. As his anxiety is most often in the evening and an early night. And he feels quite depressed and is poorly sleeping. Waking up frequently during the night. He has no suicidal thoughts. Thoughts. He has no substance abuse currently. He does have a history of counseling in 2009 with behavioral health but he really is not willing to discuss that with me other disabled he made some bad choices.  He is requesting also a referral to a counseling center.  He has no other complaints of new medical issues this time    Outpatient Prescriptions Prior to Visit  Medication Sig Dispense Refill  . albuterol (VENTOLIN HFA) 108 (90 BASE) MCG/ACT inhaler Inhale 2 puffs into the lungs every 6 (six) hours as needed for wheezing or shortness of breath. 18 each 6  . cetirizine (ZYRTEC) 10 MG tablet Take 1 tablet (10 mg total) by mouth daily. 30 tablet 11  . fluticasone (FLONASE) 50 MCG/ACT nasal spray Place 2 sprays into both nostrils daily. 16 g 11  . meclizine (ANTIVERT) 25 MG tablet Take 1 tablet (25 mg total) by mouth 3 (three) times daily as needed for dizziness. 30 tablet 0  . mometasone-formoterol (DULERA) 200-5 MCG/ACT AERO Use 2 puffs by amylase and twice a day followed by rinsing your  mouth out with water 1 Inhaler 4  . omeprazole (PRILOSEC) 40 MG capsule Take 1 capsule (40 mg total) by mouth daily. 30 capsule 3  . pseudoephedrine-guaifenesin (MUCINEX D) 60-600 MG per tablet Take 1 tablet by mouth every 12 (twelve) hours. 18 tablet 0  . EPINEPHrine (EPIPEN) 0.3  mg/0.3 mL DEVI Inject 0.3 mLs (0.3 mg total) into the muscle once. (Patient not taking: Reported on 05/26/2015) 1 Device 0  . loperamide (IMODIUM A-D) 2 MG tablet 2 now and one hourly prn diarrhea.  Max 8 tabs in 24 hours (Patient not taking: Reported on 05/26/2015) 30 tablet 0  . ondansetron (ZOFRAN-ODT) 8 MG disintegrating tablet Take 1 tablet (8 mg total) by mouth every 8 (eight) hours as needed for nausea. (Patient not taking: Reported on 05/26/2015) 30 tablet 0  . pseudoephedrine (SUDAFED 12 HOUR) 120 MG 12 hr tablet Take 1 tablet (120 mg total) by mouth daily. (Patient not taking: Reported on 05/26/2015) 30 tablet 0   No facility-administered medications prior to visit.    History   Social History  . Marital Status: Single    Spouse Name: N/A  . Number of Children: N/A  . Years of Education: N/A   Social History Main Topics  . Smoking status: Never Smoker   . Smokeless tobacco: Never Used  . Alcohol Use: No  . Drug Use: No  . Sexual Activity: Yes   Other Topics Concern  . None   Social History Narrative    Family History  Problem Relation Age of Onset  . Hypertension Mother   . Stroke Father   . Heart disease Maternal Grandfather     Past Medical History  Diagnosis Date  . Asthma   . Obesity   .  Allergy   . Anxiety      Review of Systems  Constitutional: Negative for fever, chills and appetite change.  HENT: Negative for congestion, ear pain, postnasal drip, sinus pressure and sore throat.   Eyes: Negative for pain and redness.  Respiratory: Negative for cough, shortness of breath and wheezing.   Cardiovascular: Negative for leg swelling.  Gastrointestinal: Negative for nausea, vomiting, abdominal pain, diarrhea, constipation and blood in stool.  Endocrine: Negative for polyuria.  Genitourinary: Negative for dysuria, urgency, frequency and flank pain.  Musculoskeletal: Negative for gait problem.  Skin: Negative for rash.  Neurological: Negative for weakness and  headaches.  Psychiatric/Behavioral: Positive for sleep disturbance, dysphoric mood and decreased concentration. Negative for suicidal ideas, confusion and self-injury. The patient is nervous/anxious.     Objective:  BP 118/68 mmHg  Pulse 88  Temp(Src) 97.9 F (36.6 C) (Oral)  Resp 17  Ht 6\' 5"  (1.956 m)  Wt 288 lb (130.636 kg)  BMI 34.14 kg/m2  SpO2 99%  BP Readings from Last 3 Encounters:  05/26/15 118/68  05/01/15 118/78  03/17/15 122/80    Wt Readings from Last 3 Encounters:  05/26/15 288 lb (130.636 kg)  05/01/15 288 lb (130.636 kg)  03/17/15 287 lb (130.182 kg)    Physical Exam  Constitutional: He is oriented to person, place, and time. He appears well-developed and well-nourished.  HENT:  Head: Normocephalic and atraumatic.  Eyes: Conjunctivae are normal. Pupils are equal, round, and reactive to light.  Pulmonary/Chest: Effort normal.  Abdominal: Soft. Normal appearance. There is no tenderness.  Musculoskeletal: He exhibits no edema.  Neurological: He is alert and oriented to person, place, and time.  Skin: Skin is dry.  Psychiatric: His behavior is normal. Judgment and thought content normal. His mood appears not anxious. Cognition and memory are normal. He exhibits a depressed mood.  Poor appetite.  No weight loss.  Concerned about his loss of his mother unexpectedly in December.  Anxiety at night and when driving.  No suicidal ideation or thoughts of self harm.    Lab Results  Component Value Date   WBC 7.3 03/02/2015   HGB 14.9 03/02/2015   HCT 45.6 03/02/2015   PLT 267 05/31/2010      .  Assessment & Plan:   Gerilyn PilgrimJacob was seen today for anxiety, panic attack, fatigue and medication refill.  Diagnoses and all orders for this visit:  Anxiety associated with depression Orders: -     Ambulatory referral to Psychology  Other orders -     PARoxetine (PAXIL) 20 MG tablet; Take 1 tablet (20 mg total) by mouth daily. -     LORazepam (ATIVAN) 1 MG  tablet; Take 1 tablet (1 mg total) by mouth every 8 (eight) hours as needed for anxiety.  I am having Mr. Siemon start on PARoxetine and LORazepam. I am also having him maintain his EPINEPHrine, mometasone-formoterol, loperamide, ondansetron, omeprazole, albuterol, cetirizine, pseudoephedrine, fluticasone, meclizine, and pseudoephedrine-guaifenesin.  Meds ordered this encounter  Medications  . PARoxetine (PAXIL) 20 MG tablet    Sig: Take 1 tablet (20 mg total) by mouth daily.    Dispense:  30 tablet    Refill:  5  . LORazepam (ATIVAN) 1 MG tablet    Sig: Take 1 tablet (1 mg total) by mouth every 8 (eight) hours as needed for anxiety.    Dispense:  90 tablet    Refill:  0    Appropriate red flag conditions were discussed with the patient as well  as actions that should be taken.  Patient expressed his understanding.  Follow-up: No Follow-up on file.  Carmelina Dane, MD

## 2015-06-01 ENCOUNTER — Telehealth: Payer: Self-pay

## 2015-06-01 ENCOUNTER — Ambulatory Visit (INDEPENDENT_AMBULATORY_CARE_PROVIDER_SITE_OTHER): Payer: PRIVATE HEALTH INSURANCE | Admitting: Physician Assistant

## 2015-06-01 VITALS — BP 110/82 | HR 81 | Temp 98.3°F | Resp 18 | Ht 77.0 in | Wt 292.0 lb

## 2015-06-01 DIAGNOSIS — H6983 Other specified disorders of Eustachian tube, bilateral: Secondary | ICD-10-CM

## 2015-06-01 DIAGNOSIS — J9801 Acute bronchospasm: Secondary | ICD-10-CM | POA: Diagnosis not present

## 2015-06-01 DIAGNOSIS — R42 Dizziness and giddiness: Secondary | ICD-10-CM

## 2015-06-01 DIAGNOSIS — R079 Chest pain, unspecified: Secondary | ICD-10-CM

## 2015-06-01 DIAGNOSIS — J4530 Mild persistent asthma, uncomplicated: Secondary | ICD-10-CM

## 2015-06-01 DIAGNOSIS — F4381 Prolonged grief disorder: Secondary | ICD-10-CM

## 2015-06-01 DIAGNOSIS — F418 Other specified anxiety disorders: Secondary | ICD-10-CM | POA: Diagnosis not present

## 2015-06-01 DIAGNOSIS — T7840XD Allergy, unspecified, subsequent encounter: Secondary | ICD-10-CM

## 2015-06-01 DIAGNOSIS — F4321 Adjustment disorder with depressed mood: Secondary | ICD-10-CM

## 2015-06-01 DIAGNOSIS — F4329 Adjustment disorder with other symptoms: Secondary | ICD-10-CM

## 2015-06-01 LAB — POCT CBC
GRANULOCYTE PERCENT: 64.8 % (ref 37–80)
HCT, POC: 42.5 % — AB (ref 43.5–53.7)
Hemoglobin: 14.8 g/dL (ref 14.1–18.1)
LYMPH, POC: 2 (ref 0.6–3.4)
MCH, POC: 27.4 pg (ref 27–31.2)
MCHC: 34.8 g/dL (ref 31.8–35.4)
MCV: 78.8 fL — AB (ref 80–97)
MID (cbc): 0.7 (ref 0–0.9)
MPV: 6.2 fL (ref 0–99.8)
POC Granulocyte: 5 (ref 2–6.9)
POC LYMPH %: 26.6 % (ref 10–50)
POC MID %: 8.6 %M (ref 0–12)
Platelet Count, POC: 283 10*3/uL (ref 142–424)
RBC: 5.4 M/uL (ref 4.69–6.13)
RDW, POC: 13.8 %
WBC: 7.7 10*3/uL (ref 4.6–10.2)

## 2015-06-01 LAB — GLUCOSE, POCT (MANUAL RESULT ENTRY): POC Glucose: 84 mg/dl (ref 70–99)

## 2015-06-01 MED ORDER — FLUTICASONE PROPIONATE 50 MCG/ACT NA SUSP: 2.0000 | Freq: Every day | NASAL | Status: DC

## 2015-06-01 MED ORDER — ESCITALOPRAM OXALATE 10 MG PO TABS: 10.0000 mg | ORAL_TABLET | Freq: Every day | ORAL | Status: DC

## 2015-06-01 MED ORDER — MOMETASONE FURO-FORMOTEROL FUM 200-5 MCG/ACT IN AERO: 2.0000 | INHALATION_SPRAY | Freq: Two times a day (BID) | RESPIRATORY_TRACT | Status: DC

## 2015-06-01 MED ORDER — MOMETASONE FURO-FORMOTEROL FUM 200-5 MCG/ACT IN AERO: INHALATION_SPRAY | RESPIRATORY_TRACT | Status: DC

## 2015-06-01 MED ORDER — ALBUTEROL SULFATE HFA 108 (90 BASE) MCG/ACT IN AERS: 2.0000 | INHALATION_SPRAY | Freq: Four times a day (QID) | RESPIRATORY_TRACT | Status: DC | PRN

## 2015-06-01 NOTE — Patient Instructions (Signed)
For therapy -- [facility] ([id number] [place], [place]) - [phone] [facility] [place]) - [phone] [facility] - [phone] [facility]  - [phone] (do not file insurance)

## 2015-06-01 NOTE — Progress Notes (Signed)
Russell [name]  MRN: [id number] DOB: April 09, 1991  Subjective:  Pt presents to clinic for multiple concerns 1- wants a referral to ENT to have tubes placed - he saw ENT about a month ago for ETD and now he is tired of it and he would like ot have tubes placed - he thinks he needs a referral 2- he woke up last night from sleep and felt weird "weightless" and he thought it might be his blood pressure so he took it and it was 130/117 and then he took it again and it was a similar reading.  He has used this cuff before and he has gotten normal readings.  He is worried about is BP.  He was able to go back to sleep but then this am he still had that weightless feeling so he decided to be evaluated.  He has had many panic attacks in the past and this did not feel like that. 3- he is still dealing with his mothers death from November 25, 2023.  He states he does not feel depressed.  He is not sad and he feels that he is worth something.  He does have some feeling of hopelessness.  He has trouble sleeping but this is normal for him - he states he is a restless sleeper and always has been.  He currently has not thoughts of hurting himself or others.  He has never sought therapy since his mothers death but does plan on doing that.  He is married.  He has family h/o of mother and maternal uncle and anxiety with panic disorder he does not think either of them were treated.  Treated during childhood for ADHD and does not want medications that cause him to be altered. 4- needs refills of his medications - he uses Dulera daily and he finds that over the last couple of weeks he has had to use his albuterol inhaler at night 2-3 a week. His albuterol does work for his SOB when he has it.  Patient Active Problem List   Diagnosis Date Noted  . Tinnitus 04/09/2015  . Bronchospasm 03/02/2015  . Allergy 03/02/2015  . 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 BMI 36.0-36.9,adult 11/02/2014    Current Outpatient Prescriptions on File Prior to  Visit  Medication Sig Dispense Refill  . cetirizine (ZYRTEC) 10 MG tablet Take 1 tablet (10 mg total) by mouth daily. 30 tablet 11  . EPINEPHrine (EPIPEN) 0.3 mg/0.3 mL DEVI Inject 0.3 mLs (0.3 mg total) into the muscle once. 1 Device 0  . omeprazole (PRILOSEC) 40 MG capsule Take 1 capsule (40 mg total) by mouth daily. 30 capsule 3  . loperamide (IMODIUM A-D) 2 MG tablet 2 now and one hourly prn diarrhea.  Max 8 tabs in 24 hours (Patient not taking: Reported on 06/01/2015) 30 tablet 0  . LORazepam (ATIVAN) 1 MG tablet Take 1 tablet (1 mg total) by mouth every 8 (eight) hours as needed for anxiety. (Patient not taking: Reported on 06/01/2015) 90 tablet 0  . meclizine (ANTIVERT) 25 MG tablet Take 1 tablet (25 mg total) by mouth 3 (three) times daily as needed for dizziness. (Patient not taking: Reported on 06/01/2015) 30 tablet 0  . ondansetron (ZOFRAN-ODT) 8 MG disintegrating tablet Take 1 tablet (8 mg total) by mouth every 8 (eight) hours as needed for nausea. (Patient not taking: Reported on 06/01/2015) 30 tablet 0  . PARoxetine (PAXIL) 20 MG tablet Take 1 tablet (20 mg total) by mouth daily. (Patient not taking: Reported  on 06/01/2015) 30 tablet 5   No current facility-administered medications on file prior to visit.    Allergies  Allergen Reactions  . Penicillins     Unknown    Review of Systems  Constitutional: Negative for chills and fatigue.  HENT: Negative.   Respiratory: Negative for cough.   Cardiovascular: Negative for chest pain and palpitations.  Genitourinary: Negative.   Neurological: Positive for light-headedness. Negative for dizziness, numbness and headaches.  Psychiatric/Behavioral: Positive for sleep disturbance and dysphoric mood. The patient is nervous/anxious.    Objective:  BP 110/82 mmHg  Pulse 81  Temp(Src) 98.3 F (36.8 C) (Oral)  Resp 18  Ht  (1.956 m)  Wt 292 lb (132.45 kg)  BMI 34.62 kg/m2  SpO2 99%  Physical Exam  Constitutional: He is oriented  to person, place, and time and well-developed, well-nourished, and in no distress.     HENT:  Head: Normocephalic and atraumatic.  Right Ear: Hearing, tympanic membrane, external ear and ear canal normal.  Left Ear: Hearing, tympanic membrane, external ear and ear canal normal.  Nose: Mucosal edema (pale) present.  Mouth/Throat: Uvula is midline, oropharynx is clear and moist and mucous membranes are normal.  Eyes: Conjunctivae are normal.  Neck: Normal range of motion.  Cardiovascular: Normal rate, regular rhythm and normal heart sounds.   Pulmonary/Chest: Effort normal and breath sounds normal. He has no wheezes.  Lymphadenopathy:       Head (right side): No tonsillar adenopathy present.       Head (left side): No tonsillar adenopathy present.    He has no cervical adenopathy.       Right: No supraclavicular adenopathy present.       Left: No supraclavicular adenopathy present.  Neurological: He is alert and oriented to person, place, and time. He has normal reflexes. Gait normal.  Skin: Skin is warm and dry.  Psychiatric: Memory, affect and judgment normal.  Well groomed.  Conversant.   Wife in room with the patient and offers no support nor does she respond to him when he looks at her during our conversation for support or answers.  He asks her questions and she does not respond to these questions.  Results for orders placed or performed in visit on 06/01/15  POCT CBC  Result Value Ref Range   WBC 7.7 4.6 - 10.2 K/uL   Lymph, poc 2.0 0.6 - 3.4   POC LYMPH PERCENT 26.6 10 - 50 %L   MID (cbc) 0.7 0 - 0.9   POC MID % 8.6 0 - 12 %M   POC Granulocyte 5.0 2 - 6.9   Granulocyte percent 64.8 37 - 80 %G   RBC 5.40 4.69 - 6.13 M/uL   Hemoglobin 14.8 14.1 - 18.1 g/dL   HCT, POC 52.8 (A) 41.3 - 53.7 %   MCV 78.8 (A) 80 - 97 fL   MCH, POC 27.4 27 - 31.2 pg   MCHC 34.8 31.8 - 35.4 g/dL   RDW, POC 24.4 %   Platelet Count, POC 283 142 - 424 K/uL   MPV 6.2 0 - 99.8 fL  POCT glucose  (manual entry)  Result Value Ref Range   POC Glucose 84 70 - 99 mg/dl    Assessment and Plan :  Lightheaded - unsure of exact etiology - labs are normal today and BP is also normal.  I suspect that it might be related to his mood disorder and wonder if maybe he had a slight panic attack  that has been different in others in the past.  He is going to make sure he is well hydrated and monitor his BP and keep track of it.  Plan: POCT CBC, POCT glucose (manual entry)  Eustachian tube dysfunction, bilateral - Plan: fluticasone (FLONASE) 50 MCG/ACT nasal spray - [t will call and make an appt with ENT and a referral is not needed because he saw them last month  Asthma, mild persistent, uncomplicated - Plan: mometasone-formoterol (DULERA) 200-5 MCG/ACT AERO, albuterol (VENTOLIN HFA) 108 (90 BASE) MCG/ACT inhaler, mometasone-formoterol (DULERA) 200-5 MCG/ACT AERO - refilled his medications  Allergy, subsequent encounter - Plan: albuterol (VENTOLIN HFA) 108 (90 BASE) MCG/ACT inhaler - his use is slightly more than we would like at this time but he has been off his flonase and with his anxiety that may be increasing his use  Depression with anxiety - Plan: escitalopram (LEXAPRO) 10 MG tablet - D/w pt at length different medications and how they work and the indication for them - he believes that he is ready to start medications.    Prolonged grief reaction - d/w pt that counseling can be helpful in regards to accepting his mother death and the grief and guilt associated with it.  He will recheck in a month.  Benny Lennert PA-C  [facility] 06/01/2015 10:30 AM

## 2015-06-01 NOTE — Telephone Encounter (Signed)
Pharm called for clarification on sig for [facility] which was written with directions that make no sense (Dragon error). Spoke to Maralyn Sago and gave pharm correct sig. Changed in EPIC

## 2015-07-12 ENCOUNTER — Ambulatory Visit (INDEPENDENT_AMBULATORY_CARE_PROVIDER_SITE_OTHER): Payer: PRIVATE HEALTH INSURANCE | Admitting: Internal Medicine

## 2015-07-12 VITALS — BP 117/81 | HR 93 | Temp 97.8°F | Resp 18 | Ht 77.0 in | Wt 292.8 lb

## 2015-07-12 DIAGNOSIS — J029 Acute pharyngitis, unspecified: Secondary | ICD-10-CM | POA: Diagnosis not present

## 2015-07-12 DIAGNOSIS — R21 Rash and other nonspecific skin eruption: Secondary | ICD-10-CM | POA: Diagnosis not present

## 2015-07-12 DIAGNOSIS — J020 Streptococcal pharyngitis: Secondary | ICD-10-CM | POA: Diagnosis not present

## 2015-07-12 DIAGNOSIS — J028 Acute pharyngitis due to other specified organisms: Principal | ICD-10-CM

## 2015-07-12 DIAGNOSIS — B9789 Other viral agents as the cause of diseases classified elsewhere: Secondary | ICD-10-CM

## 2015-07-12 LAB — POCT CBC
Granulocyte percent: 66.8 %G (ref 37–80)
HEMATOCRIT: 44.3 % (ref 43.5–53.7)
Hemoglobin: 15.1 g/dL (ref 14.1–18.1)
LYMPH, POC: 1.5 (ref 0.6–3.4)
MCH: 27.5 pg (ref 27–31.2)
MCHC: 34.1 g/dL (ref 31.8–35.4)
MCV: 80.7 fL (ref 80–97)
MID (cbc): 0.6 (ref 0–0.9)
MPV: 6.4 fL (ref 0–99.8)
POC Granulocyte: 4.3 (ref 2–6.9)
POC LYMPH PERCENT: 23.7 %L (ref 10–50)
POC MID %: 9.5 % (ref 0–12)
Platelet Count, POC: 222 10*3/uL (ref 142–424)
RBC: 5.48 M/uL (ref 4.69–6.13)
RDW, POC: 13.3 %
WBC: 6.5 10*3/uL (ref 4.6–10.2)

## 2015-07-12 LAB — POCT SEDIMENTATION RATE: POCT SED RATE: 21 mm/h (ref 0–22)

## 2015-07-12 NOTE — Progress Notes (Signed)
Subjective:    Patient ID: Russell [name], male    DOB: 11/12/1991, 24 y.o.   MRN: [id number] This chart was scribed for Russell Sia, MD by Russell [name], Medical Scribe. This patient was seen in Room 9 and the patient's care was started at 3:30 PM.   HPI HPI Comments: Russell [name] is a 24 y.o. male who presents to the [facility] complaining of gradual onset sore throat that started yesterday. Patient reports having associated congestion. He also reports having some painful pumps on his hands and feet. He has been feeling fatigued since having a tympanostomy tube placement surgery 4 days ago. Patient denies fever, cough, difficulty swallowing, diarrhea, and urinary symptoms.   PSHx includes tonsillectomy 3-4 years ago.   Review of Systems No chest pain palpitations or shortness of breath    Objective:   Physical Exam  Constitutional: He is oriented to person, place, and time. He appears well-developed and well-nourished. No distress.  HENT:  Head: Normocephalic and atraumatic.  Right Ear: External ear normal.  Left Ear: External ear normal.  Mouth/Throat: No oropharyngeal exudate.  Mild redness in the posterior pharynx without exudate or other intraoral lesions No anterior cervical adenopathy  Eyes: Conjunctivae are normal. Pupils are equal, round, and reactive to light.  Neck: Normal range of motion. Neck supple. No thyromegaly present.  Cardiovascular: Normal rate, regular rhythm and normal heart sounds.  Exam reveals no friction rub.   No murmur heard. Pulmonary/Chest: Effort normal and breath sounds normal. No respiratory distress. He has no wheezes.  Abdominal: Soft. There is no tenderness.  Musculoskeletal: He exhibits no edema.  Neurological: He is alert and oriented to person, place, and time. No cranial nerve deficit.  Skin: Skin is warm and dry. Rash noted.  He has 2 lesions on his right palm that are macular, erythematous, and slightly tender  without target formation or vesiculation. There is a similar lesion on his right sole  Psychiatric: He has a normal mood and affect. His behavior is normal.  Vitals reviewed.  BP 117/81 mmHg  Pulse 93  Temp(Src) 97.8 F (36.6 C) (Oral)  Resp 18  Ht  (1.956 m)  Wt 292 lb 12.8 oz (132.813 kg)  BMI 34.71 kg/m2  SpO2 99% Results for orders placed or performed in visit on 07/12/15  POCT CBC  Result Value Ref Range   WBC 6.5 4.6 - 10.2 K/uL   Lymph, poc 1.5 0.6 - 3.4   POC LYMPH PERCENT 23.7 10 - 50 %L   MID (cbc) 0.6 0 - 0.9   POC MID % 9.5 0 - 12 %M   POC Granulocyte 4.3 2 - 6.9   Granulocyte percent 66.8 37 - 80 %G   RBC 5.48 4.69 - 6.13 M/uL   Hemoglobin 15.1 14.1 - 18.1 g/dL   HCT, POC 62.1 30.8 - 53.7 %   MCV 80.7 80 - 97 fL   MCH, POC 27.5 27 - 31.2 pg   MCHC 34.1 31.8 - 35.4 g/dL   RDW, POC 65.7 %   Platelet Count, POC 222 142 - 424 K/uL   MPV 6.4 0 - 99.8 fL  POCT SEDIMENTATION RATE  Result Value Ref Range   POCT SED RATE 21 0 - 22 mm/hr          Assessment & Plan:  Sore throat (viral) - Plan: Culture, Group A Strep, POCT CMERRIT FRIESENd nonspecific skin eruption - Plan: POCT CBC, POCT  SEDIMENTATION RATE   We discuss this rash is something that potentially could be dangerous if it begins to spread and if he begins to have fever or any other feelings of illness like dizziness or cough or chest pain or palpitations. Because he has a normal cardiac exam, normal white blood cell count, and a stable overall physical examination we will simply follow this illness.  OTC meds if needed     I have completed the patient encounter in its entirety as documented by the scribe, with editing by me where necessary. Russell [name] P. Russell [name], M.D.

## 2015-07-13 ENCOUNTER — Ambulatory Visit (INDEPENDENT_AMBULATORY_CARE_PROVIDER_SITE_OTHER): Payer: PRIVATE HEALTH INSURANCE | Admitting: Family Medicine

## 2015-07-13 VITALS — BP 126/82 | HR 74 | Temp 97.6°F | Resp 16 | Ht 77.0 in | Wt 288.0 lb

## 2015-07-13 DIAGNOSIS — B084 Enteroviral vesicular stomatitis with exanthem: Secondary | ICD-10-CM

## 2015-07-13 NOTE — Progress Notes (Signed)
Subjective:  Patient ID: Russell [name], male    DOB: [birth year]  Age: 24 y.o. MRN: [id number]  Patient is here with regard to the rash that he was seen for yesterday. He has noticed more places, especially on the feet with blister formation. He has not had any develop on the genitalia are not ask. He continues to have some red spots on his hands. He has not felt any worse. He felt a little bit ill over the weekend. His ears still bothering him post op, with some stuffiness and only opens transiently when he uses the medication drops.   Objective:   TMs have tubes in both sides. Look fairly normal. Throat has red spots especially on the soft palate, little macular areas 3 or so millimeters in diameter. Tongue does not look bad. Neck supple without significant nodes. Chest clear. Heart regular without murmurs. Abdomen soft. He has some macular areas on both hands, 2 of his left hand and 7 are so on the right hand. His feet have blisters, especially along several of the toes and between the toes. He has some erythematous areas both on the soles of feet and on the dorsum of the feet.  Assessment & Plan:   Assessment: Hand-foot-and-mouth syndrome  Plan: Patient Instructions  Try and avoid direct face-to-face contact for reducing chance of spread  Avoid getting posterior contents on other people. Consider wearing cotton socks.  Return if needed if anything is changing  Stay off of work this week  Return if worseHand, Foot, and Mouth Disease Hand, foot, and mouth disease is a common viral illness. It occurs mainly in children younger than 49 years of age, but adolescents and adults may also get it. This disease is different than foot and mouth disease that cattle, sheep, and pigs get. Most people are better in 1 week. CAUSES  Hand, foot, and mouth disease is usually caused by a group of viruses called enteroviruses. Hand, foot, and mouth disease can spread from person to person (contagious). A  person is most contagious during the first week of the illness. It is not transmitted to or from pets or other animals. It is most common in the summer and early fall. Infection is spread from person to person by direct contact with an infected person's:  Nose discharge.  Throat discharge.  Stool. SYMPTOMS  Open sores (ulcers) occur in the mouth. Symptoms may also include:  A rash on the hands and feet, and occasionally the buttocks.  Fever.  Aches.  Pain from the mouth ulcers.  Fussiness. DIAGNOSIS  Hand, foot, and mouth disease is one of many infections that cause mouth sores. To be certain your child has hand, foot, and mouth disease your caregiver will diagnose your child by physical exam.Additional tests are not usually needed. TREATMENT  Nearly all patients recover without medical treatment in 7 to 10 days. There are no common complications. Your child should only take over-the-counter or prescription medicines for pain, discomfort, or fever as directed by your caregiver. Your caregiver may recommend the use of an over-the-counter antacid or a combination of an antacid and diphenhydramine to help coat the lesions in the mouth and improve symptoms.  HOME CARE INSTRUCTIONS  Try combinations of foods to see what your child will tolerate and aim for a balanced diet. Soft foods may be easier to swallow. The mouth sores from hand, foot, and mouth disease typically hurt and are painful when exposed to salty, spicy, or acidic food or drinks.  Milk and cold drinks are soothing for some patients. Milk shakes, frozen ice pops, slushies, and sherberts are usually well tolerated.  Sport drinks are good choices for hydration, and they also provide a few calories. Often, a child with hand, foot, and mouth disease will be able to drink without discomfort.   For younger children and infants, feeding with a cup, spoon, or syringe may be less painful than drinking through the nipple of a  bottle.  Keep children out of childcare programs, schools, or other group settings during the first few days of the illness or until they are without fever. The sores on the body are not contagious. SEEK IMMEDIATE MEDICAL CARE IF:  Your child develops signs of dehydration such as:  Decreased urination.  Dry mouth, tongue, or lips.  Decreased tears or sunken eyes.  Dry skin.  Rapid breathing.  Fussy behavior.  Poor color or pale skin.  Fingertips taking longer than 2 seconds to turn pink after a gentle squeeze.  Rapid weight loss.  Your child does not have adequate pain relief.  Your child develops a severe headache, stiff neck, or change in behavior.  Your child develops ulcers or blisters that occur on the lips or outside of the mouth. Document Released: 09/03/2003 Document Revised: 02/27/2012 Document Reviewed: 05/19/2011 [facility] Patient Information 2015 [place], [place]. This information is not intended to replace advice given to you by your health care provider. Make sure you discuss any questions you have with your health care provider.     Christie Viscomi, MD 07/13/2015

## 2015-07-13 NOTE — Patient Instructions (Addendum)
Try and avoid direct face-to-face contact for reducing chance of spread  Avoid getting posterior contents on other people. Consider wearing cotton socks.  Return if needed if anything is changing  Stay off of work this week  Return if worseHand, Foot, and Mouth Disease Hand, foot, and mouth disease is a common viral illness. It occurs mainly in children younger than 23 years of age, but adolescents and adults may also get it. This disease is different than foot and mouth disease that cattle, sheep, and pigs get. Most people are better in 1 week. CAUSES  Hand, foot, and mouth disease is usually caused by a group of viruses called enteroviruses. Hand, foot, and mouth disease can spread from person to person (contagious). A person is most contagious during the first week of the illness. It is not transmitted to or from pets or other animals. It is most common in the summer and early fall. Infection is spread from person to person by direct contact with an infected person's:  Nose discharge.  Throat discharge.  Stool. SYMPTOMS  Open sores (ulcers) occur in the mouth. Symptoms may also include:  A rash on the hands and feet, and occasionally the buttocks.  Fever.  Aches.  Pain from the mouth ulcers.  Fussiness. DIAGNOSIS  Hand, foot, and mouth disease is one of many infections that cause mouth sores. To be certain your child has hand, foot, and mouth disease your caregiver will diagnose your child by physical exam.Additional tests are not usually needed. TREATMENT  Nearly all patients recover without medical treatment in 7 to 10 days. There are no common complications. Your child should only take over-the-counter or prescription medicines for pain, discomfort, or fever as directed by your caregiver. Your caregiver may recommend the use of an over-the-counter antacid or a combination of an antacid and diphenhydramine to help coat the lesions in the mouth and improve symptoms.  HOME CARE  INSTRUCTIONS  Try combinations of foods to see what your child will tolerate and aim for a balanced diet. Soft foods may be easier to swallow. The mouth sores from hand, foot, and mouth disease typically hurt and are painful when exposed to salty, spicy, or acidic food or drinks.  Milk and cold drinks are soothing for some patients. Milk shakes, frozen ice pops, slushies, and sherberts are usually well tolerated.  Sport drinks are good choices for hydration, and they also provide a few calories. Often, a child with hand, foot, and mouth disease will be able to drink without discomfort.   For younger children and infants, feeding with a cup, spoon, or syringe may be less painful than drinking through the nipple of a bottle.  Keep children out of childcare programs, schools, or other group settings during the first few days of the illness or until they are without fever. The sores on the body are not contagious. SEEK IMMEDIATE MEDICAL CARE IF:  Your child develops signs of dehydration such as:  Decreased urination.  Dry mouth, tongue, or lips.  Decreased tears or sunken eyes.  Dry skin.  Rapid breathing.  Fussy behavior.  Poor color or pale skin.  Fingertips taking longer than 2 seconds to turn pink after a gentle squeeze.  Rapid weight loss.  Your child does not have adequate pain relief.  Your child develops a severe headache, stiff neck, or change in behavior.  Your child develops ulcers or blisters that occur on the lips or outside of the mouth. Document Released: 09/03/2003 Document Revised: 02/27/2012  Document Reviewed: 05/19/2011 [facility] Patient Information 2015 [place], [place]. This information is not intended to replace advice given to you by your health care provider. Make sure you discuss any questions you have with your health care provider.

## 2015-07-14 LAB — CULTURE, GROUP A STREP: Organism ID, Bacteria: NORMAL

## 2015-07-19 ENCOUNTER — Telehealth: Payer: Self-pay

## 2015-07-19 NOTE — Telephone Encounter (Signed)
Patient was seen last Sunday 7/24 and Monday 7/25.   He wants to know if he has to RTC for a week out of work note.   [phone] (H)

## 2015-07-20 NOTE — Telephone Encounter (Signed)
Once he is without fever x 24 hours (without medication), he may return to work without concern for infecting others.  OK to provide him a note.

## 2015-07-20 NOTE — Telephone Encounter (Signed)
Pt needs a note stating he is not contagious. Done

## 2015-11-16 ENCOUNTER — Encounter: Payer: Self-pay | Admitting: Internal Medicine

## 2015-11-20 ENCOUNTER — Encounter (HOSPITAL_COMMUNITY): Payer: Self-pay | Admitting: Emergency Medicine

## 2015-11-20 ENCOUNTER — Emergency Department (HOSPITAL_COMMUNITY)
Admission: EM | Admit: 2015-11-20 | Discharge: 2015-11-20 | Disposition: A | Discharge status: Home / Self Care | Payer: PRIVATE HEALTH INSURANCE | Attending: Emergency Medicine | Admitting: Emergency Medicine

## 2015-11-20 DIAGNOSIS — Z79899 Other long term (current) drug therapy: Secondary | ICD-10-CM | POA: Insufficient documentation

## 2015-11-20 DIAGNOSIS — H6991 Unspecified Eustachian tube disorder, right ear: Secondary | ICD-10-CM | POA: Insufficient documentation

## 2015-11-20 DIAGNOSIS — Z880 Allergy status to penicillin: Secondary | ICD-10-CM | POA: Insufficient documentation

## 2015-11-20 DIAGNOSIS — J45909 Unspecified asthma, uncomplicated: Secondary | ICD-10-CM | POA: Insufficient documentation

## 2015-11-20 DIAGNOSIS — H6591 Unspecified nonsuppurative otitis media, right ear: Secondary | ICD-10-CM

## 2015-11-20 DIAGNOSIS — Z9622 Myringotomy tube(s) status: Secondary | ICD-10-CM | POA: Insufficient documentation

## 2015-11-20 DIAGNOSIS — E669 Obesity, unspecified: Secondary | ICD-10-CM | POA: Insufficient documentation

## 2015-11-20 MED ORDER — CEFDINIR 300 MG PO CAPS: 300.0000 mg | ORAL_CAPSULE | Freq: Two times a day (BID) | ORAL | Status: DC

## 2015-11-20 NOTE — ED Provider Notes (Signed)
CSN: [id number]     Arrival date & time 11/20/15  2051 History  By signing my name below, I, Evon Slackerrance Branch, attest that this documentation has been prepared under the direction and in the presence of Sharilyn SitesLisa Gwenlyn Hottinger, PA-C. Electronically Signed: Evon Slackerrance Branch, ED Scribe. 11/20/2015. 9:06 PM.     Chief Complaint  Patient presents with  . Otalgia   Patient is a 24 y.o. male presenting with ear pain. The history is provided by the patient. No language interpreter was used.  Otalgia Associated symptoms: no congestion, no cough, no fever and no hearing loss    HPI Comments: Russell [name] is a 24 y.o. male who presents to the Emergency Department complaining of ear pain onset 3 days prior. Pt states that he has bilateral ear pain but the right is worse than the right. He states that he recently used a different shower and some soap may have got got into his ear. Pt states that he does have bilateral tympanostomy tubes placed. Pt doesn't report fever, chills, cough congestion or loss of hearing. Pt doesn't report injury or trauma to the ears.   Past Medical History  Diagnosis Date  . Asthma   . Obesity   . Allergy   . Anxiety    Past Surgical History  Procedure Laterality Date  . Tonsillectomy    . Tympanostomy tube placement     Family History  Problem Relation Age of Onset  . Hypertension Mother   . Mental illness Mother   . Stroke Father   . Heart disease Maternal Grandfather   . Mental retardation Maternal Uncle    Social History  Substance Use Topics  . Smoking status: Never Smoker   . Smokeless tobacco: Never Used  . Alcohol Use: No    Review of Systems  Constitutional: Negative for fever and chills.  HENT: Positive for ear pain. Negative for congestion and hearing loss.   Respiratory: Negative for cough.   All other systems reviewed and are negative.     Allergies  Penicillins  Home Medications   Prior to Admission medications   Medication Sig Start Date End  Date Taking? Authorizing Provider  cetirizine (ZYRTEC) 10 MG tablet Take 1 tablet (10 mg total) by mouth daily. 03/17/15   Dorna LeitzNicole Bush V, PA-C  ciprofloxacin-dexamethasone (CIPRODEX) otic suspension 4 drops 2 (two) times daily.    Historical Provider, MD  EPINEPHrine (EPIPEN) 0.3 mg/0.3 mL DEVI Inject 0.3 mLs (0.3 mg total) into the muscle once. 07/11/12   Heather Jaquita RectorM Marte, PA-C  omeprazole (PRILOSEC) 40 MG capsule Take 1 capsule (40 mg total) by mouth daily. 03/02/15   Jonita Albeehris W Guest, MD   BP 136/84 mmHg  Pulse 90  Temp(Src) 97.1 F (36.2 C) (Oral)  Resp 14  SpO2 100%   Physical Exam  Constitutional: He is oriented to person, place, and time. He appears well-developed and well-nourished.  HENT:  Head: Normocephalic and atraumatic.  Right Ear: Hearing and ear canal normal. Tympanic membrane is erythematous.  Left Ear: Hearing and ear canal normal. Tympanic membrane is not erythematous.  Mouth/Throat: Oropharynx is clear and moist.  White tympanostomy tubes in place bilaterally, right TM appears erythematous, slight bulge; no mastoid tenderness  Eyes: Conjunctivae and EOM are normal. Pupils are equal, round, and reactive to light.  Neck: Normal range of motion.  Cardiovascular: Normal rate, regular rhythm and normal heart sounds.   Pulmonary/Chest: Effort normal and breath sounds normal.  Abdominal: Soft. Bowel sounds are normal.  Musculoskeletal: Normal range of motion.  Neurological: He is alert and oriented to person, place, and time.  Skin: Skin is warm and dry.  Psychiatric: He has a normal mood and affect.  Nursing note and vitals reviewed.   ED Course  Procedures (including critical care time) DIAGNOSTIC STUDIES: Oxygen Saturation is 100% on RA, normal by my interpretation.    COORDINATION OF CARE: 9:18 PM-Discussed treatment plan with pt at bedside and pt agreed to plan.     Labs Review Labs Reviewed - No data to display  Imaging Review No results found.    EKG  Interpretation None      MDM   Final diagnoses:  Nonsuppurative otitis media with disorder of eustachian tube, right   24 year old male here with right ear pain. Appears to have right otitis media on exam. His tympanostomy tubes remain in place. Infection may have been caused by getting soap and his ear during shower. I've encouraged patient to use earplugs while showering to avoid this in the future. Will start on Omnicef given his penicillin allergy.  He will FU with ENT next week if still having issues.  Discussed plan with patient, he/she acknowledged understanding and agreed with plan of care.  Return precautions given for new or worsening symptoms.  I personally performed the services described in this documentation, which was scribed in my presence. The recorded information has been reviewed and is accurate.  Garlon Hatchet, PA-C 11/20/15 2139  Lorre Nick, MD 11/21/15 [phone]

## 2015-11-20 NOTE — ED Notes (Addendum)
Pt from home for eval of right ear pain x3 days, pt also states tubes in his ears due to recurrent fluid in his ears. Pt denies any fevers, n/v/d at this time.

## 2015-11-20 NOTE — ED Notes (Signed)
See pa note.  

## 2015-11-20 NOTE — Discharge Instructions (Signed)
Take the prescribed medication as directed. Follow-up with ENT if you continue having problems next week. Return to the ED for new or worsening symptoms.  Otitis Media, Adult Otitis media is redness, soreness, and inflammation of the middle ear. Otitis media may be caused by allergies or, most commonly, by infection. Often it occurs as a complication of the common cold. SIGNS AND SYMPTOMS Symptoms of otitis media may include:  Earache.  Fever.  Ringing in your ear.  Headache.  Leakage of fluid from the ear. DIAGNOSIS To diagnose otitis media, your health care provider will examine your ear with an otoscope. This is an instrument that allows your health care provider to see into your ear in order to examine your eardrum. Your health care provider also will ask you questions about your symptoms. TREATMENT  Typically, otitis media resolves on its own within 3-5 days. Your health care provider may prescribe medicine to ease your symptoms of pain. If otitis media does not resolve within 5 days or is recurrent, your health care provider may prescribe antibiotic medicines if he or she suspects that a bacterial infection is the cause. HOME CARE INSTRUCTIONS   If you were prescribed an antibiotic medicine, finish it all even if you start to feel better.  Take medicines only as directed by your health care provider.  Keep all follow-up visits as directed by your health care provider. SEEK MEDICAL CARE IF:  You have otitis media only in one ear, or bleeding from your nose, or both.  You notice a lump on your neck.  You are not getting better in 3-5 days.  You feel worse instead of better. SEEK IMMEDIATE MEDICAL CARE IF:   You have pain that is not controlled with medicine.  You have swelling, redness, or pain around your ear or stiffness in your neck.  You notice that part of your face is paralyzed.  You notice that the bone behind your ear (mastoid) is tender when you touch  it. MAKE SURE YOU:   Understand these instructions.  Will watch your condition.  Will get help right away if you are not doing well or get worse.   This information is not intended to replace advice given to you by your health care provider. Make sure you discuss any questions you have with your health care provider.   Document Released: 09/09/2004 Document Revised: 12/26/2014 Document Reviewed: 07/02/2013 [employer] Interactive Patient Education [employer]2016 [employer]

## 2015-12-09 ENCOUNTER — Ambulatory Visit (INDEPENDENT_AMBULATORY_CARE_PROVIDER_SITE_OTHER): Payer: Self-pay | Admitting: Family Medicine

## 2015-12-09 VITALS — BP 120/76 | HR 95 | Temp 97.7°F | Resp 20 | Ht 78.0 in | Wt 297.0 lb

## 2015-12-09 DIAGNOSIS — F411 Generalized anxiety disorder: Secondary | ICD-10-CM

## 2015-12-09 DIAGNOSIS — J069 Acute upper respiratory infection, unspecified: Secondary | ICD-10-CM

## 2015-12-09 DIAGNOSIS — R0789 Other chest pain: Secondary | ICD-10-CM

## 2015-12-09 DIAGNOSIS — Z8659 Personal history of other mental and behavioral disorders: Secondary | ICD-10-CM

## 2015-12-09 MED ORDER — ESCITALOPRAM OXALATE 10 MG PO TABS: 10.0000 mg | ORAL_TABLET | Freq: Every day | ORAL | Status: DC

## 2015-12-09 MED ORDER — ALPRAZOLAM 0.25 MG PO TABS
0.2500 mg | ORAL_TABLET | Freq: Once | ORAL | Status: AC
  Administered 2015-12-09: 0.25 mg via ORAL

## 2015-12-09 MED ORDER — CLONAZEPAM 1 MG PO TABS: 1.0000 mg | ORAL_TABLET | Freq: Two times a day (BID) | ORAL | Status: DC | PRN

## 2015-12-09 NOTE — Progress Notes (Signed)
12/10/2015 8:01 PM   DOB: 1991/05/17 / MRN: [id number]  SUBJECTIVE:  Russell [name] is a 24 y.o. male presenting for nasal congestion, coughing and HA that started 7 days ago. Has tried dimetap for coughing and this has helped but he is still coughing. Has also tried Theraflu and Zicam without relief.    He also complains of chest pain. Reports that it feels tight under the left breast, however the pain may also be central and both dull and sharp. The pain is worse with slouching and better with sitting up straight. He denies SOB, diaphoresis, presyncope. Has a family history of CAD in his mother at age 69. No history of DM2 or HTN.  He does not smoke and never has.  Reports that he was seen at an emergency room for chest pain a few weeks ago and was advised this was due to GERD.  He was given a GI cocktail and his symptoms resolved.  He has since been taking Prilosec 40 qam and reports this has neither made his symptoms worse or better.  He denies a history of asthma.      He is allergic to penicillins.   He  has a past medical history of Asthma; Obesity; Allergy; and Anxiety.    He  reports that he has never smoked. He has never used smokeless tobacco. He reports that he does not drink alcohol or use illicit drugs. He  reports that he currently engages in sexual activity. The patient  has past surgical history that includes Tonsillectomy and Tympanostomy tube placement.  His family history includes Heart disease in his maternal grandfather; Hypertension in his mother; Mental illness in his mother; Mental retardation in his maternal uncle; Stroke in his father.  Review of Systems  Constitutional: Negative for fever and chills.  Eyes: Negative for blurred vision.  Respiratory: Positive for cough. Negative for shortness of breath.   Cardiovascular: Positive for chest pain. Negative for orthopnea and leg swelling.  Gastrointestinal: Negative for nausea and abdominal pain.  Genitourinary:  Negative for dysuria, urgency and frequency.  Musculoskeletal: Negative for myalgias.  Skin: Negative for rash.  Neurological: Negative for dizziness, tingling and headaches.  Psychiatric/Behavioral: Negative for depression. The patient is not nervous/anxious.     Problem list and medications reviewed and updated by myself where necessary, and exist elsewhere in the encounter.   OBJECTIVE:  BP 120/76 mmHg  Pulse 95  Temp(Src) 97.7 F (36.5 C) (Oral)  Resp 20  Ht  (1.981 m)  Wt 297 lb (134.718 kg)  BMI 34.33 kg/m2  SpO2 98%  Physical Exam  Constitutional: He is oriented to person, place, and time. He appears well-developed. He does not appear ill.  Eyes: Conjunctivae and EOM are normal. Pupils are equal, round, and reactive to light.  Cardiovascular: Normal rate.   Pulmonary/Chest: Effort normal.  Abdominal: He exhibits no distension.  Musculoskeletal: Normal range of motion.  Neurological: He is alert and oriented to person, place, and time. No cranial nerve deficit. Coordination normal.  Skin: Skin is warm and dry. He is not diaphoretic.  Psychiatric: He has a normal mood and affect.  Nursing note and vitals reviewed.   No results found for this or any previous visit (from the past 48 hour(s)).  ASSESSMENT AND PLAN  Gust was seen today for sinusitis and anxiety.  Diagnoses and all orders for this visit:  Chest pressure: EKG negative for changes and resolved with the plan for problem 2.  -  EKG 12-Lead  History of anxiety -     ALPRAZolam (XANAX) tablet 0.25 mg; Take 1 tablet (0.25 mg total) by mouth once.  GAD (generalized anxiety disorder): He is a difficult HPI, and appears to be somewhat slower than when I last saw him.  I do think there is a comorbid depression however I can not illicit during the interview.  It is possible that he has lost insight into his mental health and denies depression and anhedonia for that reason. Patient strongly advised to  start and SSRI and he is amenable.  Bridge therapy with benzodiazapine and will not be refilling this without exhausting other options. .   -     escitalopram (LEXAPRO) 10 MG tablet; Take 1 tablet (10 mg total) by mouth daily. Do not miss doses. -     clonazePAM (KLONOPIN) 1 MG tablet; Take 1 tablet (1 mg total) by mouth 2 (two) times daily as needed for anxiety.  Viral URI: Avoiding pseudoephedrine given problem 3.  Advised zyrtec and ibuprofen at OTC dosages.     The patient was advised to call or return to clinic if he does not see an improvement in symptoms or to seek the care of the closest emergency department if he worsens with the above plan.   Deliah BostonMichael Clark, MHS, PA-C [facility] 12/10/2015 8:01 PM

## 2015-12-10 DIAGNOSIS — F411 Generalized anxiety disorder: Secondary | ICD-10-CM | POA: Insufficient documentation

## 2015-12-18 NOTE — Progress Notes (Signed)
History and physical examinations reviewed in detail with PA Clark.  EKG reviewed and WNL. Agree with assessment and plan. Tran Arzuaga Paulita FujitaMartin Nayshawn Mesta, M.D. [facility]  [place] [address] [place], [place]  [id number] [phone] phone [phone] fax

## 2015-12-24 ENCOUNTER — Telehealth: Payer: Self-pay

## 2015-12-24 NOTE — Telephone Encounter (Signed)
Pt was seen on 12/09/15 for an ear infection. Pt states that the infection has returned and he would like something to be sent to his pharmacy to take care of this.

## 2015-12-25 NOTE — Telephone Encounter (Signed)
Left message for pt to call back.   I need symptoms? He was not seen for his ear? Need more information.

## 2016-01-01 ENCOUNTER — Ambulatory Visit (INDEPENDENT_AMBULATORY_CARE_PROVIDER_SITE_OTHER): Payer: Self-pay | Admitting: Physician Assistant

## 2016-01-01 VITALS — BP 138/80 | HR 90 | Temp 98.4°F | Resp 18 | Ht 78.0 in | Wt 297.0 lb

## 2016-01-01 DIAGNOSIS — R05 Cough: Secondary | ICD-10-CM

## 2016-01-01 DIAGNOSIS — R0981 Nasal congestion: Secondary | ICD-10-CM

## 2016-01-01 DIAGNOSIS — R059 Cough, unspecified: Secondary | ICD-10-CM

## 2016-01-01 DIAGNOSIS — Z760 Encounter for issue of repeat prescription: Secondary | ICD-10-CM

## 2016-01-01 DIAGNOSIS — H938X3 Other specified disorders of ear, bilateral: Secondary | ICD-10-CM

## 2016-01-01 MED ORDER — MOMETASONE FURO-FORMOTEROL FUM 100-5 MCG/ACT IN AERO: 2.0000 | INHALATION_SPRAY | Freq: Two times a day (BID) | RESPIRATORY_TRACT | Status: DC

## 2016-01-01 MED ORDER — HYDROCODONE-HOMATROPINE 5-1.5 MG/5ML PO SYRP: 2.5000 mL | ORAL_SOLUTION | Freq: Every evening | ORAL | Status: DC | PRN

## 2016-01-01 MED ORDER — TERBINAFINE HCL 1 % EX CREA: 1.0000 "application " | TOPICAL_CREAM | Freq: Two times a day (BID) | CUTANEOUS | Status: DC

## 2016-01-01 MED ORDER — CETIRIZINE-PSEUDOEPHEDRINE ER 5-120 MG PO TB12: 1.0000 | ORAL_TABLET | Freq: Two times a day (BID) | ORAL | Status: DC

## 2016-01-01 MED ORDER — ALBUTEROL SULFATE (2.5 MG/3ML) 0.083% IN NEBU: 2.5000 mg | INHALATION_SOLUTION | Freq: Four times a day (QID) | RESPIRATORY_TRACT | Status: DC | PRN

## 2016-01-01 NOTE — Progress Notes (Signed)
01/01/2016 3:38 PM   DOB: 1991/08/10 / MRN: [id number]  SUBJECTIVE:  Russell [name] is a 25 y.o. male presenting for ear pain that started roughly 1.5 weeks ago, and states the pain feels like ear pressure.  He has tried nothing for this.  Associates cough and sore throat, feels that he is getting a cold again after just getting over another one.  He has a history of allergies and takes Zyrtec and Flonase daily.  Has a history of asthma and would like refills of his medications today.  Takes an inhaled corticosteroid daily and has not needed his rescue inhaler in over a year.  Denies fever, chills, nausea, change in hearing.    Has been taking Lexapro daily and feels this is helping him. He denies side effects.      He is allergic to penicillins.   He  has a past medical history of Asthma; Obesity; Allergy; and Anxiety.    He  reports that he has never smoked. He has never used smokeless tobacco. He reports that he does not drink alcohol or use illicit drugs. He  reports that he currently engages in sexual activity. The patient  has past surgical history that includes Tonsillectomy and Tympanostomy tube placement.  His family history includes Heart disease in his maternal grandfather; Hypertension in his mother; Mental illness in his mother; Mental retardation in his maternal uncle; Stroke in his father.  Review of Systems  Constitutional: Negative for fever.  HENT: Positive for congestion. Negative for ear discharge, ear pain, hearing loss and tinnitus.   Respiratory: Positive for cough. Negative for sputum production.   Cardiovascular: Negative for chest pain.  Gastrointestinal: Negative for nausea.  Musculoskeletal: Negative for neck pain.  Skin: Negative for rash.  Neurological: Negative for dizziness and headaches.    Problem list and medications reviewed and updated by myself where necessary, and exist elsewhere in the encounter.   OBJECTIVE:  BP 138/80 mmHg  Pulse 90   Temp(Src) 98.4 F (36.9 C) (Oral)  Resp 18  Ht 6\' 6"  (1.981 m)  Wt 297 lb (134.718 kg)  BMI 34.33 kg/m2  SpO2 98%  Physical Exam  Constitutional: He is oriented to person, place, and time. He appears well-developed. He does not appear ill.  HENT:  Right Ear: Hearing normal. Tympanic membrane is not erythematous, not retracted and not bulging. No middle ear effusion.  Left Ear: Tympanic membrane is not erythematous, not retracted and not bulging.  No middle ear effusion.  Ears:  Eyes: Conjunctivae and EOM are normal. Pupils are equal, round, and reactive to light.  Cardiovascular: Normal rate.   Pulmonary/Chest: Effort normal.  Abdominal: He exhibits no distension.  Musculoskeletal: Normal range of motion.  Neurological: He is alert and oriented to person, place, and time. No cranial nerve deficit. Coordination normal.  Skin: Skin is warm and dry. He is not diaphoretic.  Psychiatric: He has a normal mood and affect.  Nursing note and vitals reviewed.   No results found for this or any previous visit (from the past 48 hour(s)).  ASSESSMENT AND PLAN  Russell [name] was seen today for ear pain.  Diagnoses and all orders for this visit:  Ear pressure, bilateral: Will try something for nasal congestion to see if this helps.  The TM or Tubes do not appear abnormal.  Advised that he call or RTC in five days if no improvement and will try Amox 875 bid for 10 days to see if he gets relief with  that.   -     cetirizine-pseudoephedrine (ZYRTEC-D ALLERGY & CONGESTION) 5-120 MG tablet; Take 1 tablet by mouth 2 (two) times daily.  Nasal congestion -     cetirizine-pseudoephedrine (ZYRTEC-D ALLERGY & CONGESTION) 5-120 MG tablet; Take 1 tablet by mouth 2 (two) times daily.  Cough -     HYDROcodone-homatropine (HYCODAN) 5-1.5 MG/5ML syrup; Take 2.5-5 mLs by mouth at bedtime as needed.  Medication refill -     albuterol (PROVENTIL) (2.5 MG/3ML) 0.083% nebulizer solution; Take 3 mLs (2.5 mg total) by  nebulization every 6 (six) hours as needed for wheezing or shortness of breath. -     mometasone-formoterol (DULERA) 100-5 MCG/ACT AERO; Inhale 2 puffs into the lungs 2 (two) times daily.  Other orders -     terbinafine (LAMISIL) 1 % cream; Apply 1 application topically 2 (two) times daily.    The patient was advised to call or return to clinic if he does not see an improvement in symptoms or to seek the care of the closest emergency department if he worsens with the above plan.   Deliah Boston, MHS, PA-C [facility] 01/01/2016 3:38 PM

## 2016-01-06 ENCOUNTER — Telehealth: Payer: Self-pay

## 2016-01-06 ENCOUNTER — Other Ambulatory Visit: Payer: Self-pay | Admitting: Physician Assistant

## 2016-01-06 MED ORDER — FLUTICASONE-SALMETEROL 250-50 MCG/DOSE IN AEPB: 1.0000 | INHALATION_SPRAY | Freq: Two times a day (BID) | RESPIRATORY_TRACT | Status: DC

## 2016-01-06 NOTE — Telephone Encounter (Signed)
PA is needed for [facility]. Checked formulary and it is a plan exclusion. Preferred in class are Advair HFA or Diskus, and Symbicort. I do not see in EPIC that pt ever tried either of these preferred meds previously, in which case PA will be denied (probably will be denied if plan exclusion anyway). Casimiro Needle, can we send in Rx for Advair of Symbicort instead?

## 2016-01-06 NOTE — Telephone Encounter (Signed)
Done. Sent advair in. Deliah Boston, MS, PA-C 8:08 PM, 01/06/2016

## 2016-02-03 ENCOUNTER — Ambulatory Visit (INDEPENDENT_AMBULATORY_CARE_PROVIDER_SITE_OTHER): Payer: Self-pay | Admitting: Physician Assistant

## 2016-02-03 VITALS — BP 128/84 | HR 86 | Temp 98.5°F | Resp 20 | Ht 76.5 in | Wt 307.0 lb

## 2016-02-03 DIAGNOSIS — H6982 Other specified disorders of Eustachian tube, left ear: Secondary | ICD-10-CM

## 2016-02-03 DIAGNOSIS — Z23 Encounter for immunization: Secondary | ICD-10-CM

## 2016-02-03 DIAGNOSIS — K219 Gastro-esophageal reflux disease without esophagitis: Secondary | ICD-10-CM

## 2016-02-03 DIAGNOSIS — J309 Allergic rhinitis, unspecified: Secondary | ICD-10-CM

## 2016-02-03 MED ORDER — RANITIDINE HCL 150 MG PO TABS: 150.0000 mg | ORAL_TABLET | Freq: Two times a day (BID) | ORAL | Status: DC

## 2016-02-03 MED ORDER — FEXOFENADINE HCL 180 MG PO TABS: 180.0000 mg | ORAL_TABLET | Freq: Every day | ORAL | Status: DC

## 2016-02-03 MED ORDER — TRIAMCINOLONE ACETONIDE 55 MCG/ACT NA AERO: 2.0000 | INHALATION_SPRAY | Freq: Every day | NASAL | Status: DC

## 2016-02-03 MED ORDER — GUAIFENESIN ER 1200 MG PO TB12: 1.0000 | ORAL_TABLET | Freq: Two times a day (BID) | ORAL | Status: DC | PRN

## 2016-02-03 NOTE — Progress Notes (Signed)
[facility] [address] [place] [address] [phone]  Date:  02/03/2016   Name:  Russell [name]   DOB:  June 08, 1991   MRN:  [id number]  PCP:  Tonye Pearson, MD    History of Present Illness:  Russell [name] is a 25 y.o. male patient who presents to [facility] for ear drainage, and heartburn.    Left ear: week ago, woke up with fluid in his ear.  Fluid was very minimal and without color.  He has some tinnitus.  He has some pain every now and then though is very dull.  No hearing loss.  He has had tube placement in both ears about 1 year ago.  He has some nasal congestion.  Multiple He has an allergy to cat, but he takes zyrtec which he takes daily.  No facial pain.   Chest pain across chest for about 1 year.  Some days are worse.  Anytime he eats.  No sour brash.  Some abdominal pain.  No nausea.  No associated sob, diaphoresis, or palpitations.  Omeprazole which he takes daily of , since early December.   etOH use: occasional.  No exercise at this time.   Patient Active Problem List   Diagnosis Date Noted  . GAD (generalized anxiety disorder) 12/10/2015  . Tinnitus 04/09/2015  . Bronchospasm 03/02/2015  . Allergy 03/02/2015  . 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 BMI 36.0-36.9,adult 11/02/2014    Past Medical History  Diagnosis Date  . Asthma   . Obesity   . Allergy   . Anxiety     Past Surgical History  Procedure Laterality Date  . Tonsillectomy    . Tympanostomy tube placement      Social History  Substance Use Topics  . Smoking status: Never Smoker   . Smokeless tobacco: Never Used  . Alcohol Use: No    Family History  Problem Relation Age of Onset  . Hypertension Mother   . Mental illness Mother   . Stroke Father   . Heart disease Maternal Grandfather   . Mental retardation Maternal Uncle     Allergies  Allergen Reactions  . Penicillins     Unknown    Medication list has been reviewed and updated.  Current Outpatient  Prescriptions on File Prior to Visit  Medication Sig Dispense Refill  . cetirizine (ZYRTEC) 10 MG tablet Take 1 tablet (10 mg total) by mouth daily. 30 tablet 11  . escitalopram (LEXAPRO) 10 MG tablet Take 1 tablet (10 mg total) by mouth daily. Do not miss doses. 30 tablet 3  . omeprazole (PRILOSEC) 40 MG capsule Take 1 capsule (40 mg total) by mouth daily. 30 capsule 3  . albuterol (PROVENTIL) (2.5 MG/3ML) 0.083% nebulizer solution Take 3 mLs (2.5 mg total) by nebulization every 6 (six) hours as needed for wheezing or shortness of breath. (Patient not taking: Reported on 02/03/2016) 150 mL 1  . cetirizine-pseudoephedrine (ZYRTEC-D ALLERGY & CONGESTION) 5-120 MG tablet Take 1 tablet by mouth 2 (two) times daily. (Patient not taking: Reported on 02/03/2016) 30 tablet 0  . clonazePAM (KLONOPIN) 1 MG tablet Take 1 tablet (1 mg total) by mouth 2 (two) times daily as needed for anxiety. (Patient not taking: Reported on 02/03/2016) 20 tablet 1  . EPINEPHrine (EPIPEN) 0.3 mg/0.3 mL DEVI Inject 0.3 mLs (0.3 mg total) into the muscle once. (Patient not taking: Reported on 01/01/2016) 1 Device 0  . Fluticasone-Salmeterol (ADVAIR DISKUS) 250-50 MCG/DOSE AEPB Inhale  1 puff into the lungs 2 (two) times daily. (Patient not taking: Reported on 02/03/2016) 1 each 11  . HYDROcodone-homatropine (HYCODAN) 5-1.5 MG/5ML syrup Take 2.5-5 mLs by mouth at bedtime as needed. (Patient not taking: Reported on 02/03/2016) 60 mL 0  . mometasone-formoterol (DULERA) 100-5 MCG/ACT AERO Inhale 2 puffs into the lungs 2 (two) times daily. (Patient not taking: Reported on 02/03/2016) 13 g 11  . terbinafine (LAMISIL) 1 % cream Apply 1 application topically 2 (two) times daily. (Patient not taking: Reported on 02/03/2016) 30 g 0   No current facility-administered medications on file prior to visit.    ROS ROS otherwise unremarkable unless listed above.   Physical Examination: BP 128/84 mmHg  Pulse 86  Temp(Src) 98.5 F (36.9 C) (Oral)   Resp 20  Ht 6' 4.5" (1.943 m)  Wt 307 lb (139.254 kg)  BMI 36.89 kg/m2  SpO2 96% Ideal Body Weight: Weight in (lb) to have BMI = 25: 207.7 Wt Readings from Last 3 Encounters:  02/03/16 307 lb (139.254 kg)  01/01/16 297 lb (134.718 kg)  12/09/15 297 lb (134.718 kg)      Physical Exam  Constitutional: He is oriented to person, place, and time. He appears well-developed and well-nourished. No distress.  HENT:  Head: Normocephalic and atraumatic.  Right Ear: Tympanic membrane, external ear and ear canal normal. Tympanic membrane is not injected, not erythematous and not bulging.  Left Ear: Tympanic membrane, external ear and ear canal normal. Tympanic membrane is not injected, not erythematous and not bulging.  Nose: Mucosal edema and rhinorrhea present. Right sinus exhibits no maxillary sinus tenderness and no frontal sinus tenderness. Left sinus exhibits no maxillary sinus tenderness and no frontal sinus tenderness.  Mouth/Throat: No oropharyngeal exudate, posterior oropharyngeal edema or posterior oropharyngeal erythema.  Tubes in place bilaterally.  There is no fluid or drainage visible within the canals.   Eyes: Conjunctivae and EOM are normal. Pupils are equal, round, and reactive to light.  Cardiovascular: Normal rate and regular rhythm.  Exam reveals no gallop.   No murmur heard. Pulmonary/Chest: Effort normal. No respiratory distress. He has no decreased breath sounds. He has no wheezes. He has no rhonchi.  Lymphadenopathy:       Head (right side): No submental, no submandibular, no tonsillar, no preauricular and no posterior auricular adenopathy present.       Head (left side): No submental, no submandibular, no tonsillar, no preauricular and no posterior auricular adenopathy present.    He has no cervical adenopathy.  Neurological: He is alert and oriented to person, place, and time.  Skin: Skin is warm and dry. He is not diaphoretic.  Psychiatric: He has a normal mood and  affect. His behavior is normal.     Assessment and Plan: [name] is a 25 y.o. male who is here today for ear drainage and heartburn. Advised to continue the omeprazole.  We will add ranitidine twice per day.  Given gerd instructions of diet and lifestyle modifications.  Also expressed the importance of controlling weight to prevent the reflux flares.  Patient voiced understanding. Given mucinex and switching to nasacort and allegra.  He will discontinue the flonase and the zyrtec.  At this time, he does not wish to see an ENT due to financial concerns, and we will see if we can help this, as his tubes were not concerning at this time.    Allergic rhinitis, unspecified allergic rhinitis type - Plan: fexofenadine (ALLEGRA) 180 MG tablet  Gastroesophageal  reflux disease, esophagitis presence not specified - Plan: ranitidine (ZANTAC) 150 MG tablet, triamcinolone (NASACORT) 55 MCG/ACT AERO nasal inhaler  Eustachian tube dysfunction, left - Plan: Guaifenesin (MUCINEX MAXIMUM STRENGTH) 1200 MG TB12, triamcinolone (NASACORT) 55 MCG/ACT AERO nasal inhaler  Need for prophylactic vaccination and inoculation against influenza - Plan: Flu Vaccine QUAD 36+ mos IM  Trena Platt, PA-C [facility] 2/15/20177:43 PM

## 2016-02-03 NOTE — Patient Instructions (Signed)
Changing from the nasacort from the flonase.  If this is too expensive, then use the flonase. I would like you to use mucinex  at this time, every 12 hours. Please follow the gerd diet and modifications.  I would like you to follow up in 4 weeks.  Exercising to Lose Weight Exercising can help you to lose weight. In order to lose weight through exercise, you need to do vigorous-intensity exercise. You can tell that you are exercising with vigorous intensity if you are breathing very hard and fast and cannot hold a conversation while exercising. Moderate-intensity exercise helps to maintain your current weight. You can tell that you are exercising at a moderate level if you have a higher heart rate and faster breathing, but you are still able to hold a conversation. HOW OFTEN SHOULD I EXERCISE? Choose an activity that you enjoy and set realistic goals. Your health care provider can help you to make an activity plan that works for you. Exercise regularly as directed by your health care provider. This may include:  Doing resistance training twice each week, such as:  Push-ups.  Sit-ups.  Lifting weights.  Using resistance bands.  Doing a given intensity of exercise for a given amount of time. Choose from these options:  150 minutes of moderate-intensity exercise every week.  75 minutes of vigorous-intensity exercise every week.  A mix of moderate-intensity and vigorous-intensity exercise every week. Children, pregnant women, people who are out of shape, people who are overweight, and older adults may need to consult a health care provider for individual recommendations. If you have any sort of medical condition, be sure to consult your health care provider before starting a new exercise program. WHAT ARE SOME ACTIVITIES THAT CAN HELP ME TO LOSE WEIGHT?   Walking at a rate of at least 4.5 miles an hour.  Jogging or running at a rate of 5 miles per hour.  Biking at a rate of at least  10 miles per hour.  Lap swimming.  Roller-skating or in-line skating.  Cross-country skiing.  Vigorous competitive sports, such as football, basketball, and soccer.  Jumping rope.  Aerobic dancing. HOW CAN I BE MORE ACTIVE IN MY DAY-TO-DAY ACTIVITIES?  Use the stairs instead of the elevator.  Take a walk during your lunch break.  If you drive, park your car farther away from work or school.  If you take public transportation, get off one stop early and walk the rest of the way.  Make all of your phone calls while standing up and walking around.  Get up, stretch, and walk around every 30 minutes throughout the day. WHAT GUIDELINES SHOULD I FOLLOW WHILE EXERCISING?  Do not exercise so much that you hurt yourself, feel dizzy, or get very short of breath.  Consult your health care provider prior to starting a new exercise program.  Wear comfortable clothes and shoes with good support.  Drink plenty of water while you exercise to prevent dehydration or heat stroke. Body water is lost during exercise and must be replaced.  Work out until you breathe faster and your heart beats faster.   This information is not intended to replace advice given to you by your health care provider. Make sure you discuss any questions you have with your health care provider.   Document Released: 01/07/2011 Document Revised: 12/26/2014 Document Reviewed: 05/08/2014 Elsevier Interactive Patient Education 2016 [employer].  Food Choices for Gastroesophageal Reflux Disease, Adult When you have gastroesophageal reflux disease (GERD), the foods  you eat and your eating habits are very important. Choosing the right foods can help ease the discomfort of GERD. WHAT GENERAL GUIDELINES DO I NEED TO FOLLOW?  Choose fruits, vegetables, whole grains, low-fat dairy products, and low-fat meat, fish, and poultry.  Limit fats such as oils, salad dressings, butter, nuts, and avocado.  Keep a food diary to  identify foods that cause symptoms.  Avoid foods that cause reflux. These may be different for different people.  Eat frequent small meals instead of three large meals each day.  Eat your meals slowly, in a relaxed setting.  Limit fried foods.  Cook foods using methods other than frying.  Avoid drinking alcohol.  Avoid drinking large amounts of liquids with your meals.  Avoid bending over or lying down until 2-3 hours after eating. WHAT FOODS ARE NOT RECOMMENDED? The following are some foods and drinks that may worsen your symptoms: Vegetables Tomatoes. Tomato juice. Tomato and spaghetti sauce. Chili peppers. Onion and garlic. Horseradish. Fruits Oranges, grapefruit, and lemon (fruit and juice). Meats High-fat meats, fish, and poultry. This includes hot dogs, ribs, ham, sausage, salami, and bacon. Dairy Whole milk and chocolate milk. Sour cream. Cream. Butter. Ice cream. Cream cheese.  Beverages Coffee and tea, with or without caffeine. Carbonated beverages or energy drinks. Condiments Hot sauce. Barbecue sauce.  Sweets/Desserts Chocolate and cocoa. Donuts. Peppermint and spearmint. Fats and Oils High-fat foods, including [place] fries and potato chips. Other Vinegar. Strong spices, such as black pepper, white pepper, red pepper, cayenne, curry powder, cloves, ginger, and chili powder. The items listed above may not be a complete list of foods and beverages to avoid. Contact your dietitian for more information.   This information is not intended to replace advice given to you by your health care provider. Make sure you discuss any questions you have with your health care provider.   Document Released: 12/05/2005 Document Revised: 12/26/2014 Document Reviewed: 10/09/2013 Elsevier Interactive Patient Education [employer]

## 2016-03-18 ENCOUNTER — Other Ambulatory Visit: Payer: Self-pay | Admitting: Physician Assistant

## 2016-05-01 ENCOUNTER — Other Ambulatory Visit: Payer: Self-pay | Admitting: Family Medicine

## 2016-05-04 NOTE — Telephone Encounter (Signed)
Patient actually evaluated by PA Clark on 12/09/2015 and initiated on Lexapro 10mg  daily; will forward refill to PA Clark to determine when patient needs follow-up and to determine refill.

## 2016-05-13 ENCOUNTER — Ambulatory Visit (INDEPENDENT_AMBULATORY_CARE_PROVIDER_SITE_OTHER): Payer: Self-pay | Admitting: Physician Assistant

## 2016-05-13 VITALS — BP 142/89 | HR 97 | Temp 97.1°F | Resp 16 | Ht 75.9 in | Wt 302.2 lb

## 2016-05-13 DIAGNOSIS — T148XXA Other injury of unspecified body region, initial encounter: Secondary | ICD-10-CM

## 2016-05-13 DIAGNOSIS — F411 Generalized anxiety disorder: Secondary | ICD-10-CM

## 2016-05-13 MED ORDER — HYDROXYZINE HCL 10 MG PO TABS: 10.0000 mg | ORAL_TABLET | Freq: Three times a day (TID) | ORAL | Status: DC | PRN

## 2016-05-13 MED ORDER — CYCLOBENZAPRINE HCL 10 MG PO TABS: 5.0000 mg | ORAL_TABLET | Freq: Three times a day (TID) | ORAL | Status: DC | PRN

## 2016-05-13 NOTE — Progress Notes (Signed)
05/13/2016 2:52 PM   DOB: 1991/10/26 / MRN: [id number]  SUBJECTIVE:  Russell [name] is a 25 y.o. male presenting for anxiety.  He has a history of same and stopped taking his SSRI about two weeks ago.  Over the last 3 days he has developed worsening anxiety and states that the klonopin is not helping his symptoms.  He has resumed his Lexapro starting about three days ago.      He complains of straining his chest while moving a heavy object.  States the pain is left, sternal, and made worse by pushing.   He is allergic to penicillins.   He  has a past medical history of Asthma; Obesity; Allergy; and Anxiety.    He  reports that he has never smoked. He has never used smokeless tobacco. He reports that he does not drink alcohol or use illicit drugs. He  reports that he currently engages in sexual activity. The patient  has past surgical history that includes Tonsillectomy and Tympanostomy tube placement.  His family history includes Heart disease in his maternal grandfather; Hypertension in his mother; Mental illness in his mother; Mental retardation in his maternal uncle; Stroke in his father.  Review of Systems  Constitutional: Negative for fever and chills.  Musculoskeletal: Positive for myalgias.  Skin: Negative for itching and rash.  Neurological: Negative for headaches.  Psychiatric/Behavioral: Positive for depression. The patient is nervous/anxious.     Problem list and medications reviewed and updated by myself where necessary, and exist elsewhere in the encounter.   OBJECTIVE:  BP 142/89 mmHg  Pulse 97  Temp(Src) 97.1 F (36.2 C) (Oral)  Resp 16  Ht 6' 3.9" (1.928 m)  Wt 302 lb 3.2 oz (137.077 kg)  BMI 36.88 kg/m2  SpO2 100%  Physical Exam  Constitutional: He is oriented to person, place, and time. He appears well-developed. He does not appear ill.  Eyes: Conjunctivae and EOM are normal. Pupils are equal, round, and reactive to light.  Cardiovascular: Normal rate and  regular rhythm.   Pulmonary/Chest: Effort normal and breath sounds normal.    Abdominal: He exhibits no distension.  Musculoskeletal: Normal range of motion.  Neurological: He is alert and oriented to person, place, and time. No cranial nerve deficit. Coordination normal.  Skin: Skin is warm and dry. He is not diaphoretic.  Psychiatric: His mood appears anxious. His affect is angry. His affect is not blunt, not labile and not inappropriate. His speech is not rapid and/or pressured. He is not agitated, not aggressive, not hyperactive and not slowed. Thought content is not paranoid and not delusional. He exhibits a depressed mood. He expresses no homicidal ideation.  Nursing note and vitals reviewed.   No results found for this or any previous visit (from the past 72 hour(s)).  No results found.  ASSESSMENT AND PLAN  Gerilyn PilgrimJacob was seen today for chest strain, anxiety and phq-9 score 6.  Diagnoses and all orders for this visit:  Anxiety state: Klonopin q6.  Adrax q8.  Continue SSRI.  -     hydrOXYzine (ATARAX/VISTARIL) 10 MG tablet; Take 1 tablet (10 mg total) by mouth 3 (three) times daily as needed.  Muscle strain -     cyclobenzaprine (FLEXERIL) 10 MG tablet; Take 0.5-1 tablets (5-10 mg total) by mouth 3 (three) times daily as needed for muscle spasms (May cause drowsiness. Do no operate heavy machinery while taking.).    The patient was advised to call or return to clinic if he does  not see an improvement in symptoms or to seek the care of the closest emergency department if he worsens with the above plan.   Deliah Boston, MHS, PA-C [facility] 05/13/2016 2:52 PM

## 2017-04-04 DIAGNOSIS — M79641 Pain in right hand: Secondary | ICD-10-CM | POA: Insufficient documentation

## 2017-04-04 DIAGNOSIS — S62354A Nondisplaced fracture of shaft of fourth metacarpal bone, right hand, initial encounter for closed fracture: Secondary | ICD-10-CM | POA: Insufficient documentation

## 2017-09-18 DIAGNOSIS — Z639 Problem related to primary support group, unspecified: Secondary | ICD-10-CM | POA: Insufficient documentation

## 2017-09-18 DIAGNOSIS — Z569 Unspecified problems related to employment: Secondary | ICD-10-CM | POA: Insufficient documentation

## 2017-09-18 DIAGNOSIS — Z7289 Other problems related to lifestyle: Secondary | ICD-10-CM | POA: Insufficient documentation

## 2017-09-18 DIAGNOSIS — F4320 Adjustment disorder, unspecified: Secondary | ICD-10-CM | POA: Insufficient documentation

## 2017-09-22 DIAGNOSIS — F331 Major depressive disorder, recurrent, moderate: Secondary | ICD-10-CM | POA: Insufficient documentation

## 2017-09-22 DIAGNOSIS — F3132 Bipolar disorder, current episode depressed, moderate: Secondary | ICD-10-CM | POA: Insufficient documentation

## 2017-10-20 DIAGNOSIS — F4322 Adjustment disorder with anxiety: Secondary | ICD-10-CM | POA: Insufficient documentation

## 2018-11-28 ENCOUNTER — Encounter (HOSPITAL_COMMUNITY): Payer: Self-pay

## 2018-11-28 ENCOUNTER — Emergency Department (HOSPITAL_COMMUNITY)
Admission: EM | Admit: 2018-11-28 | Discharge: 2018-11-28 | Disposition: A | Discharge status: Home / Self Care | Payer: PRIVATE HEALTH INSURANCE | Attending: Emergency Medicine | Admitting: Emergency Medicine

## 2018-11-28 DIAGNOSIS — J45909 Unspecified asthma, uncomplicated: Secondary | ICD-10-CM | POA: Insufficient documentation

## 2018-11-28 DIAGNOSIS — M545 Low back pain, unspecified: Secondary | ICD-10-CM

## 2018-11-28 DIAGNOSIS — Z79899 Other long term (current) drug therapy: Secondary | ICD-10-CM | POA: Insufficient documentation

## 2018-11-28 MED ORDER — METHOCARBAMOL 500 MG PO TABS: 500.0000 mg | ORAL_TABLET | Freq: Two times a day (BID) | ORAL | 0 refills | Status: DC

## 2018-11-28 NOTE — Discharge Instructions (Addendum)
Please read attached information. If you experience any new or worsening signs or symptoms please return to the emergency room for evaluation. Please follow-up with your primary care provider or specialist as discussed. Please use medication prescribed only as directed and discontinue taking if you have any concerning signs or symptoms.   °

## 2018-11-28 NOTE — ED Provider Notes (Signed)
[facility] EMERGENCY DEPARTMENT Provider Note   CSN: [id number] Arrival date & time: 11/28/18  1350     History   Chief Complaint Chief Complaint  Patient presents with  . Back Pain    HPI [name] is a 27 y.o. male.  HPI   27 year old male presents today with low back pain.  Patient notes a 2-week history of mid lumbar back pain that radiates down into his buttock bilateral.  He denies any extension down in the lower extremities, denies any loss of distal sensation strength and motor function.  No abdominal pain urinary or bowel changes.  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  He denies any relief with aspirin or ibuprofen at home.  No history of the same.  Symptoms are made worse with moving, worse at night when lying in bed.  Denies fever.   Past Medical History:  Diagnosis Date  . Allergy   . Anxiety   . Asthma   . Obesity     Patient Active Problem List   Diagnosis Date Noted  . GAD (generalized anxiety disorder) 12/10/2015  . Tinnitus 04/09/2015  . Bronchospasm 03/02/2015  . Allergy 03/02/2015  . 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 BMI 36.0-36.9,adult 11/02/2014    Past Surgical History:  Procedure Laterality Date  . TONSILLECTOMY    . TYMPANOSTOMY TUBE PLACEMENT          Home Medications    Prior to Admission medications   Medication Sig Start Date End Date Taking? Authorizing Provider  cetirizine (ZYRTEC) 10 MG tablet Take 1 tablet (10 mg total) by mouth daily. 03/17/15   Dorna Leitz, PA-C  clonazePAM (KLONOPIN) 1 MG tablet Take 1 tablet (1 mg total) by mouth 2 (two) times daily as needed for anxiety. 12/09/15   Ofilia Neas, PA-C  cyclobenzaprine (FLEXERIL) 10 MG tablet Take 0.5-1 tablets (5-10 mg total) by mouth 3 (three) times daily as needed for muscle spasms (May cause drowsiness. Do no operate heavy machinery while taking.). 05/13/16   Ofilia Neas, PA-C  EPINEPHrine (EPIPEN) 0.3 mg/0.3  mL DEVI Inject 0.3 mLs (0.3 mg total) into the muscle once. 07/11/12   Nelva Nay, PA-C  escitalopram (LEXAPRO) 10 MG tablet TAKE 1 TABLET EVERY DAY. DO NOT MISS DOSES 05/04/16   Ofilia Neas, PA-C  fexofenadine (ALLEGRA) 180 MG tablet Take 1 tablet (180 mg total) by mouth daily. 02/03/16   Trena Platt D, PA  Fluticasone-Salmeterol (ADVAIR DISKUS) 250-50 MCG/DOSE AEPB Inhale 1 puff into the lungs 2 (two) times daily. 01/06/16   Ofilia Neas, PA-C  hydrOXYzine (ATARAX/VISTARIL) 10 MG tablet Take 1 tablet (10 mg total) by mouth 3 (three) times daily as needed. 05/13/16   Ofilia Neas, PA-C  methocarbamol (ROBAXIN) 500 MG tablet Take 1 tablet (500 mg total) by mouth 2 (two) times daily. 11/28/18   Celeste Candelas, Tinnie Gens, PA-C  mometasone-formoterol (DULERA) 100-5 MCG/ACT AERO Inhale 2 puffs into the lungs 2 (two) times daily. 01/01/16   Ofilia Neas, PA-C  ranitidine (ZANTAC) 150 MG tablet Take 1 tablet (150 mg total) by mouth 2 (two) times daily. 02/03/16   Trena Platt D, PA  terbinafine (LAMISIL) 1 % cream Apply 1 application topically 2 (two) times daily. 01/01/16   Ofilia Neas, PA-C  triamcinolone (NASACORT) 55 MCG/ACT AERO nasal inhaler Place 2 sprays into the nose daily. 02/03/16   Garnetta Buddy, PA    Family  History Family History  Problem Relation Age of Onset  . Hypertension Mother   . Mental illness Mother   . Stroke Father   . Heart disease Maternal Grandfather   . Mental retardation Maternal Uncle     Social History Social History   Tobacco Use  . Smoking status: Never Smoker  . Smokeless tobacco: Never Used  Substance Use Topics  . Alcohol use: No    Alcohol/week: 0.0 standard drinks  . Drug use: No     Allergies   Penicillins   Review of Systems Review of Systems  All other systems reviewed and are negative.    Physical Exam Updated Vital Signs Pulse 88   Temp 99.2 F (37.3 C)   Resp 16   Ht 6\' 5"  (1.956 m)   Wt 129.3 kg    SpO2 98%   BMI 33.80 kg/m   Physical Exam  Constitutional: He is oriented to person, place, and time. He appears well-developed and well-nourished.  HENT:  Head: Normocephalic and atraumatic.  Eyes: Pupils are equal, round, and reactive to light. Conjunctivae are normal. Right eye exhibits no discharge. Left eye exhibits no discharge. No scleral icterus.  Neck: Normal range of motion. No JVD present. No tracheal deviation present.  Pulmonary/Chest: Effort normal. No stridor.  Musculoskeletal:  Minor tenderness palpation of the mid lumbar bilateral musculature extending down to the gluteus, no midline tenderness of the cervical thoracic or lumbar regions-bilateral lower extremity sensation strength motor function intact  Neurological: He is alert and oriented to person, place, and time. Coordination normal.  Psychiatric: He has a normal mood and affect. His behavior is normal. Judgment and thought content normal.  Nursing note and vitals reviewed.    ED Treatments / Results  Labs (all labs ordered are listed, but only abnormal results are displayed) Labs Reviewed - No data to display  EKG None  Radiology No results found.  Procedures Procedures (including critical care time)  Medications Ordered in ED Medications - No data to display   Initial Impression / Assessment and Plan / ED Course  I have reviewed the triage vital signs and the nursing notes.  Pertinent labs & imaging results that were available during my care of the patient were reviewed by me and considered in my medical decision making (see chart for details).     27 year old male presents today with low back pain.  Likely muscular nature.  Advised patient to rest, take some time off work, muscle relaxers as needed, anti-inflammatories.  This has been 2-week of symptoms he will follow-up as an outpatient with orthopedics.  Strict return precautions given.   Final Clinical Impressions(s) / ED Diagnoses    Final diagnoses:  Acute bilateral low back pain without sciatica    ED Discharge Orders         Ordered    methocarbamol (ROBAXIN) 500 MG tablet  2 times daily     11/28/18 1505           Eyvonne MechanicHedges, Christyan Reger, PA-C 11/28/18 1507    Benjiman CorePickering, Nathan, MD 11/28/18 [phone]

## 2018-11-28 NOTE — ED Triage Notes (Signed)
Pt presents with 2 week h/o mid-back pain, denies any injury.  Pt reports pain radiates down into middle of buttocks, reports pain is worse at night and sitting.  Pt denies any bowel or bladder incontinence.

## 2018-12-17 ENCOUNTER — Other Ambulatory Visit: Payer: Self-pay

## 2018-12-17 ENCOUNTER — Emergency Department (HOSPITAL_COMMUNITY)
Admission: EM | Admit: 2018-12-17 | Discharge: 2018-12-17 | Discharge status: Against Medical Advice | Payer: Self-pay | Attending: Emergency Medicine | Admitting: Emergency Medicine

## 2018-12-17 ENCOUNTER — Encounter (HOSPITAL_COMMUNITY): Payer: Self-pay | Admitting: Emergency Medicine

## 2018-12-17 DIAGNOSIS — Z5321 Procedure and treatment not carried out due to patient leaving prior to being seen by health care provider: Secondary | ICD-10-CM | POA: Insufficient documentation

## 2018-12-17 NOTE — ED Triage Notes (Signed)
Pt arrives to ED from with complaints of on going severe back pain that starts in his thoracic region and radiates to his sacral region. Pt reports he had the pain for over a month. Pt ambulatory and denies any numbness or urinary symptoms.

## 2018-12-18 ENCOUNTER — Encounter (HOSPITAL_COMMUNITY): Payer: Self-pay | Admitting: *Deleted

## 2018-12-18 ENCOUNTER — Emergency Department (HOSPITAL_COMMUNITY)
Admission: EM | Admit: 2018-12-18 | Discharge: 2018-12-18 | Disposition: A | Discharge status: Home / Self Care | Payer: Self-pay | Attending: Emergency Medicine | Admitting: Emergency Medicine

## 2018-12-18 DIAGNOSIS — J45909 Unspecified asthma, uncomplicated: Secondary | ICD-10-CM | POA: Insufficient documentation

## 2018-12-18 DIAGNOSIS — M546 Pain in thoracic spine: Secondary | ICD-10-CM | POA: Insufficient documentation

## 2018-12-18 DIAGNOSIS — Z79899 Other long term (current) drug therapy: Secondary | ICD-10-CM | POA: Insufficient documentation

## 2018-12-18 DIAGNOSIS — Z760 Encounter for issue of repeat prescription: Secondary | ICD-10-CM | POA: Insufficient documentation

## 2018-12-18 MED ORDER — ALBUTEROL SULFATE HFA 108 (90 BASE) MCG/ACT IN AERS: 1.0000 | INHALATION_SPRAY | Freq: Once | RESPIRATORY_TRACT | Status: DC

## 2018-12-18 MED ORDER — PREDNISONE 10 MG (21) PO TBPK: ORAL_TABLET | Freq: Every day | ORAL | 0 refills | Status: DC

## 2018-12-18 MED ORDER — CYCLOBENZAPRINE HCL 10 MG PO TABS: 5.0000 mg | ORAL_TABLET | Freq: Two times a day (BID) | ORAL | 0 refills | Status: AC | PRN

## 2018-12-18 MED ORDER — ALBUTEROL SULFATE HFA 108 (90 BASE) MCG/ACT IN AERS
1.0000 | INHALATION_SPRAY | Freq: Once | RESPIRATORY_TRACT | Status: AC
  Administered 2018-12-18: 1 via RESPIRATORY_TRACT
  Filled 2018-12-18: qty 6.7

## 2018-12-18 MED ORDER — MOMETASONE FURO-FORMOTEROL FUM 200-5 MCG/ACT IN AERO: 2.0000 | INHALATION_SPRAY | Freq: Every morning | RESPIRATORY_TRACT | 0 refills | Status: DC

## 2018-12-18 NOTE — ED Provider Notes (Signed)
[facility] Provider Note   CSN: [id number] Arrival date & time: 12/18/18  [id number]     History   Chief Complaint Chief Complaint  Patient presents with  . Back Pain  . Asthma    HPI [name] Firmin is a 27 y.o. male.  HPI   Patient is a 27 year old male with a history of allergies, anxiety, asthma, obesity, who presents the emergency department today for evaluation of left mid back pain that has been ongoing for the last 3 weeks.  Pain was initially located to his left lower back, he was seen in the ED and was started on Robaxin.  He states his pain improved however over the last few days his pain returned and is now located to the left mid back.  States pain is currently 8-9/10.  Pain is worse in the morning and at night.  Pain actually feels improved when he gets up and ambulates and moves around.  He denies any abdominal pain, nausea, vomiting, diarrhea or urinary symptoms.  No fevers or chills.  No current shortness of breath or chest pain.  He states he does a lot of heavy lifting at work.  Denies leg pain/swelling, hemoptysis, recent surgery/trauma, recent long travel, hormone use, personal hx of cancer, or hx of DVT/PE.   Patient also states that he recently ran out of his inhalers.  [facility] and [facility]er[facility].     Past Medical History:  Diagnosis Date  . Allergy   . Anxiety   . Asthma   . Obesity     Patient Active Problem List   Diagnosis Date Noted  . GAD (generalized anxiety disorder) 12/10/2015  . Tinnitus 04/09/2015  . Bronchospasm 03/02/2015  . Allergy 03/02/2015  . 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 BMI 36.0-36.9,adult 11/02/2014    Past Surgical History:  Procedure Laterality Date  . TONSILLECTOMY    . TYMPANOSTOMY TUBE PLACEMENT          Home Medications    Prior to Admission medications   Medication Sig Start Date End Date Taking? Authorizing Provider  cetirizine (ZYRTEC) 10 MG tablet  Take 1 tablet (10 mg total) by mouth daily. 03/17/15   Dorna LeitzBush, Nicole V, PA-C  clonazePAM (KLONOPIN) 1 MG tablet Take 1 tablet (1 mg total) by mouth 2 (two) times daily as needed for anxiety. 12/09/15   Ofilia Neaslark, Michael L, PA-C  cyclobenzaprine (FLEXERIL) 10 MG tablet Take 0.5 tablets (5 mg total) by mouth 2 (two) times daily as needed for up to 5 days for muscle spasms. You may take 1/2 tablet up to three times daily as needed. Do not drive, operate machinery, or work while taking this medication. 12/18/18 12/23/18  Bladen Umar S, PA-C  EPINEPHrine (EPIPEN) 0.3 mg/0.3 mL DEVI Inject 0.3 mLs (0.3 mg total) into the muscle once. 07/11/12   Nelva NayMarte, Heather M, PA-C  escitalopram (LEXAPRO) 10 MG tablet TAKE 1 TABLET EVERY DAY. DO NOT MISS DOSES 05/04/16   Ofilia Neaslark, Michael L, PA-C  fexofenadine (ALLEGRA) 180 MG tablet Take 1 tablet (180 mg total) by mouth daily. 02/03/16   Trena PlattEnglish, Stephanie D, PA  Fluticasone-Salmeterol (ADVAIR DISKUS) 250-50 MCG/DOSE AEPB Inhale 1 puff into the lungs 2 (two) times daily. 01/06/16   Ofilia Neaslark, Michael L, PA-C  hydrOXYzine (ATARAX/VISTARIL) 10 MG tablet Take 1 tablet (10 mg total) by mouth 3 (three) times daily as needed. 05/13/16   Ofilia Neaslark, Michael L, PA-C  methocarbamol (ROBAXIN) 500 MG tablet Take 1 tablet (  500 mg total) by mouth 2 (two) times daily. 11/28/18   Hedges, Tinnie Gens, PA-C  mometasone-formoterol (DULERA) 100-5 MCG/ACT AERO Inhale 2 puffs into the lungs 2 (two) times daily. 01/01/16   Ofilia Neas, PA-C  predniSONE (STERAPRED UNI-PAK 21 TAB) 10 MG (21) TBPK tablet Take by mouth daily. Take 6 tabs by mouth daily  for 2 days, then 5 tabs for 2 days, then 4 tabs for 2 days, then 3 tabs for 2 days, 2 tabs for 2 days, then 1 tab by mouth daily for 2 days 12/18/18   Melva Faux S, PA-C  ranitidine (ZANTAC) 150 MG tablet Take 1 tablet (150 mg total) by mouth 2 (two) times daily. 02/03/16   Trena Platt D, PA  terbinafine (LAMISIL) 1 % cream Apply 1 application topically 2  (two) times daily. 01/01/16   Ofilia Neas, PA-C  triamcinolone (NASACORT) 55 MCG/ACT AERO nasal inhaler Place 2 sprays into the nose daily. 02/03/16   Garnetta Buddy, PA    Family History Family History  Problem Relation Age of Onset  . Hypertension Mother   . Mental illness Mother   . Stroke Father   . Heart disease Maternal Grandfather   . Mental retardation Maternal Uncle     Social History Social History   Tobacco Use  . Smoking status: Never Smoker  . Smokeless tobacco: Never Used  Substance Use Topics  . Alcohol use: No    Alcohol/week: 0.0 standard drinks  . Drug use: No     Allergies   Penicillins   Review of Systems Review of Systems  Constitutional: Negative for chills and fever.  HENT: Negative for congestion.   Respiratory: Negative for shortness of breath.   Cardiovascular: Negative for chest pain.  Gastrointestinal: Negative for abdominal pain, constipation, diarrhea, nausea and vomiting.  Genitourinary: Negative for decreased urine volume.       No loss of control of bowel or bladder function  Musculoskeletal: Positive for back pain. Negative for neck pain.  Skin: Negative for rash.  Neurological: Negative for weakness, numbness and headaches.     Physical Exam Updated Vital Signs BP 128/77 (BP Location: Right Arm)   Pulse 76   Temp 97.6 F (36.4 C) (Oral)   Resp 18   SpO2 100%   Physical Exam Vitals signs and nursing note reviewed.  Constitutional:      Appearance: He is well-developed.  HENT:     Head: Normocephalic and atraumatic.     Nose: Nose normal. No congestion or rhinorrhea.     Mouth/Throat:     Mouth: Mucous membranes are moist.     Pharynx: No oropharyngeal exudate or posterior oropharyngeal erythema.  Eyes:     Conjunctiva/sclera: Conjunctivae normal.  Neck:     Musculoskeletal: Neck supple.  Cardiovascular:     Rate and Rhythm: Normal rate and regular rhythm.     Heart sounds: Normal heart sounds. No murmur.   Pulmonary:     Effort: Pulmonary effort is normal. No respiratory distress.     Breath sounds: Normal breath sounds. No stridor. No wheezing or rhonchi.  Abdominal:     General: Bowel sounds are normal. There is no distension.     Palpations: Abdomen is soft.     Tenderness: There is no abdominal tenderness. There is no right CVA tenderness, left CVA tenderness, guarding or rebound.  Musculoskeletal:     Comments: TTP to the mid thoracic spine and to the left mid thoracic paraspinous muscles which  reproduces pain. No rashes. No bruising.  Skin:    General: Skin is warm and dry.  Neurological:     Mental Status: He is alert.     Comments: Motor:  Normal tone. 5/5 strength of BUE and BLE major muscle groups including strong and equal grip strength and dorsiflexion/plantar flexion Gait: normal gait and balance.        ED Treatments / Results  Labs (all labs ordered are listed, but only abnormal results are displayed) Labs Reviewed - No data to display  EKG None  Radiology No results found.  Procedures Procedures (including critical care time)  Medications Ordered in ED Medications  albuterol (PROVENTIL HFA;VENTOLIN HFA) 108 (90 Base) MCG/ACT inhaler 1 puff (has no administration in time range)  albuterol (PROVENTIL HFA;VENTOLIN HFA) 108 (90 Base) MCG/ACT inhaler 1 puff (1 puff Inhalation Given 12/18/18 1212)     Initial Impression / Assessment and Plan / ED Course  I have reviewed the triage vital signs and the nursing notes.  Pertinent labs & imaging results that were available during my care of the patient were reviewed by me and considered in my medical decision making (see chart for details).     Final Clinical Impressions(s) / ED Diagnoses   Final diagnoses:  Left-sided thoracic back pain, unspecified chronicity  Medication refill   Patient complaining of mid back pain that has been ongoing and seem to worsen over the last several days.  No red flag signs or  symptoms.  Less likely secondary to nephrolithiasis or other intra-abdominal source as pain is reproducible with palpation of the thoracic spine and thoracic paraspinous muscles.  Doubt PE ED or other cardiac/pulmonary etiology as patient not short of breath or having coughing.  He also has no risk factors for PE.  Will give Rx for steroid and muscle relaxer.  Advised Tylenol, Motrin Rice protocol and rest.  Have advised that if he has any new or worsening symptoms that would suggest as a cause that he should return to the emergency department for reevaluation.  He is also requesting refill of his albuterol and Dulera inhalers.  Will refill albuterol inhaler in the ED and [facility].  Have advised him to follow-up with PCP, resources given.  Patient was understanding the plan reasons to return to the ED.  All questions answered.  ED Discharge Orders         Ordered    predniSONE (STERAPRED UNI-PAK 21 TAB) 10 MG (21) TBPK tablet  Daily     12/18/18 1224    cyclobenzaprine (FLEXERIL) 10 MG tablet  2 times daily PRN     12/18/18 1224           Arleigh Dicola S, PA-C 12/18/18 1225    Rolan BuccoBelfi, Melanie, MD 12/18/18 1255

## 2018-12-18 NOTE — ED Triage Notes (Signed)
Pt in c/o back pain that started about a month ago, came in last night but left without being seen, also states his asthma is flaring up and he is out of his normal medications at home, no distress on arrival

## 2018-12-18 NOTE — Discharge Instructions (Addendum)
You may alternate taking Tylenol and Ibuprofen as needed for pain control. You may take 400-600 mg of ibuprofen every 6 hours and [phone] mg of Tylenol every 6 hours. Do not exceed 4000 mg of Tylenol daily as this can lead to liver damage. Also, make sure to take Ibuprofen with meals as it can cause an upset stomach. Do not take other NSAIDs while taking Ibuprofen such as (Aleve, Naprosyn, Aspirin, Celebrex, etc) and do not take more than the prescribed dose as this can lead to ulcers and bleeding in your GI tract. You may use warm and cold compresses to help with your symptoms.   You were given a prescription for Flexeril which is a muscle relaxer.  You should not drive, work, or operate machinery while taking this medication as it can make you very drowsy.    Take prednisone as directed.  Please follow up with your primary doctor within the next 7-10 days for re-evaluation and further treatment of your symptoms.   Please return to the ER sooner if you have any new or worsening symptoms.

## 2019-02-13 ENCOUNTER — Other Ambulatory Visit: Payer: Self-pay

## 2019-02-13 ENCOUNTER — Ambulatory Visit (INDEPENDENT_AMBULATORY_CARE_PROVIDER_SITE_OTHER): Payer: Self-pay

## 2019-02-13 ENCOUNTER — Encounter (HOSPITAL_COMMUNITY): Payer: Self-pay

## 2019-02-13 ENCOUNTER — Ambulatory Visit (HOSPITAL_COMMUNITY)
Admission: EM | Admit: 2019-02-13 | Discharge: 2019-02-13 | Disposition: A | Discharge status: Home / Self Care | Payer: Self-pay | Attending: Family Medicine | Admitting: Family Medicine

## 2019-02-13 DIAGNOSIS — M25561 Pain in right knee: Secondary | ICD-10-CM

## 2019-02-13 MED ORDER — DICLOFENAC SODIUM 75 MG PO TBEC: 75.0000 mg | DELAYED_RELEASE_TABLET | Freq: Two times a day (BID) | ORAL | 0 refills | Status: DC

## 2019-02-13 NOTE — ED Triage Notes (Signed)
Pt cc pt slipped in some mud and he hit his knee on a piece of metal machinery this happened at 10:32 am today.

## 2019-02-16 NOTE — ED Provider Notes (Signed)
[facility]   [id number] 02/13/19 Arrival Time: 1856  ASSESSMENT & PLAN:  1. Acute pain of right knee     I have personally viewed the imaging studies ordered this visit. No fractures observed.  Imaging: Dg Knee Complete 4 Views Right  Result Date: 02/13/2019 CLINICAL DATA:  Initial evaluation for acute medial right knee pain status post trauma. EXAM: RIGHT KNEE - COMPLETE 4+ VIEW COMPARISON:  None. FINDINGS: No evidence of fracture, dislocation, or joint effusion. No evidence of arthropathy or other focal bone abnormality. Soft tissues are unremarkable. IMPRESSION: No acute osseous abnormality about the knee. Electronically Signed   By: Rise Mu M.D.   On: 02/13/2019 20:15   Meds ordered this encounter  Medications  . diclofenac (VOLTAREN) 75 MG EC tablet    Sig: Take 1 tablet (75 mg total) by mouth 2 (two) times daily.    Dispense:  14 tablet    Refill:  0    Orders Placed This Encounter  Procedures  . DG Knee Complete 4 Views Right    Follow-up Information    Tonye Pearson, MD.   Specialties:  Internal Medicine, Adolescent Medicine Why:  As needed. Contact information: [address] [place] [address] [phone]           Rest the injured area as much as practical.  Natural history and expected course discussed. Questions answered.   Reviewed expectations re: course of current medical issues. Questions answered. Outlined signs and symptoms indicating need for more acute intervention. Patient verbalized understanding. After Visit Summary given.  SUBJECTIVE: History from: patient. Russell [name] is a 28 y.o. male who reports fairly persistent moderate pain of his right knee; described as aching without radiation. Onset: abrupt, today. Injury/trama: reports slipping in mud today, hitting knee of a piece of metal machinery; immediate medial knee pain. Symptoms have progressed to a point and plateaued since beginning. Difficulty  bearing weight on RLE secondary to the pain. Aggravating factors: weight bearing and certain movements. Alleviating factors: rest. Associated symptoms: none reported. Extremity sensation changes or weakness: none. Self treatment: has not tried OTCs for relief of pain. History of similar: no.  Past Surgical History:  Procedure Laterality Date  . TONSILLECTOMY    . TYMPANOSTOMY TUBE PLACEMENT       ROS: As per HPI.   OBJECTIVE:  Vitals:   02/13/19 1942 02/13/19 1943  BP: 121/84   Pulse: 88   Resp: 18   Temp: 98.9 F (37.2 C)   TempSrc: Oral   SpO2: 100%   Weight:  127 kg    General appearance: alert; no distress Extremities: . RLE: warm and well perfused; fairly well localized moderate tenderness over right medial knee; without gross deformities; with mild swelling; with mild bruising; ROM: normal but with reported pain CV: brisk extremity capillary refill of RLE; 2+ DP and PT pulse of RLE. Skin: warm and dry; no visible rashes Neurologic: gait abnormal: reluctant to bear weight on RLE; normal reflexes of RLE and LLE; normal sensation of RLE and LLE; normal strength of RLE and LLE Psychological: alert and cooperative; normal mood and affect  Allergies  Allergen Reactions  . Penicillins     Unknown    Past Medical History:  Diagnosis Date  . Allergy   . Anxiety   . Asthma   . Obesity    Social History   Socioeconomic History  . Marital status: Single    Spouse name: Not on file  .  Number of children: Not on file  . Years of education: Not on file  . Highest education level: Not on file  Occupational History  . Not on file  Social Needs  . Financial resource strain: Not on file  . Food insecurity:    Worry: Not on file    Inability: Not on file  . Transportation needs:    Medical: Not on file    Non-medical: Not on file  Tobacco Use  . Smoking status: Never Smoker  . Smokeless tobacco: Never Used  Substance and Sexual Activity  . Alcohol use: No     Alcohol/week: 0.0 standard drinks  . Drug use: No  . Sexual activity: Yes  Lifestyle  . Physical activity:    Days per week: Not on file    Minutes per session: Not on file  . Stress: Not on file  Relationships  . Social connections:    Talks on phone: Not on file    Gets together: Not on file    Attends religious service: Not on file    Active member of club or organization: Not on file    Attends meetings of clubs or organizations: Not on file    Relationship status: Not on file  Other Topics Concern  . Not on file  Social History Narrative  . Not on file   Family History  Problem Relation Age of Onset  . Hypertension Mother   . Mental illness Mother   . Stroke Father   . Heart disease Maternal Grandfather   . Mental retardation Maternal Uncle    Past Surgical History:  Procedure Laterality Date  . TONSILLECTOMY    . TYMPANOSTOMY TUBE PLACEMENT        Mardella Layman, MD 02/20/19 [phone]

## 2019-03-31 ENCOUNTER — Ambulatory Visit (HOSPITAL_COMMUNITY)
Admission: EM | Admit: 2019-03-31 | Discharge: 2019-03-31 | Disposition: A | Discharge status: Home / Self Care | Payer: Self-pay | Attending: Family Medicine | Admitting: Family Medicine

## 2019-03-31 ENCOUNTER — Encounter (HOSPITAL_COMMUNITY): Payer: Self-pay | Admitting: *Deleted

## 2019-03-31 ENCOUNTER — Other Ambulatory Visit: Payer: Self-pay

## 2019-03-31 DIAGNOSIS — M545 Low back pain: Secondary | ICD-10-CM

## 2019-03-31 DIAGNOSIS — R1084 Generalized abdominal pain: Secondary | ICD-10-CM

## 2019-03-31 DIAGNOSIS — G8929 Other chronic pain: Secondary | ICD-10-CM

## 2019-03-31 LAB — POCT URINALYSIS DIP (DEVICE)
Glucose, UA: NEGATIVE mg/dL
Ketones, ur: NEGATIVE mg/dL
Leukocytes,Ua: NEGATIVE
Nitrite: NEGATIVE
Protein, ur: NEGATIVE mg/dL
Specific Gravity, Urine: >=1.03 (ref 1.005–1.030)
Urobilinogen, UA: 1 mg/dL (ref 0.0–1.0)
pH: 6 (ref 5.0–8.0)

## 2019-03-31 MED ORDER — TAMSULOSIN HCL 0.4 MG PO CAPS: 0.4000 mg | ORAL_CAPSULE | Freq: Every day | ORAL | 0 refills | Status: DC

## 2019-03-31 MED ORDER — KETOROLAC TROMETHAMINE 30 MG/ML IJ SOLN
INTRAMUSCULAR | Status: AC
  Filled 2019-03-31: qty 1

## 2019-03-31 MED ORDER — METHOCARBAMOL 500 MG PO TABS: 500.0000 mg | ORAL_TABLET | Freq: Two times a day (BID) | ORAL | 0 refills | Status: DC

## 2019-03-31 MED ORDER — KETOROLAC TROMETHAMINE 30 MG/ML IJ SOLN
30.0000 mg | Freq: Once | INTRAMUSCULAR | Status: AC
  Administered 2019-03-31: 30 mg via INTRAMUSCULAR

## 2019-03-31 MED ORDER — PREDNISONE 50 MG PO TABS: 50.0000 mg | ORAL_TABLET | Freq: Every day | ORAL | 0 refills | Status: DC

## 2019-03-31 NOTE — ED Triage Notes (Signed)
C/O back pain since Nov 2019; over past month "has moved" to bilat flank area constantly.  Started last night with generalized sharp abd pain.  Reports taking an old muscle relaxer that was 3 yrs expired - feels abd pain started right after that.  Denies n/v/d, denies fevers.

## 2019-03-31 NOTE — Discharge Instructions (Signed)
Toradol injection in office today. Start flomax for possible kidney stone. Keep hydrated, urine should be clear to pale yellow in color. You can use a strainer to see if you pass a stone. Robaxin as needed for back pain, this can make you drowsy, so do not take if you are going to drive, operate heavy machinery, or make important decisions. Ice/heat compresses as needed. You can take prednisone if symptoms still not improving in 2-3 days. If experience numbness/tingling of the inner thighs, loss of bladder or bowel control, go to the emergency department for evaluation.

## 2019-03-31 NOTE — ED Provider Notes (Signed)
[facility]    CSN: [id number] Arrival date & time: 03/31/19  1317     History   Chief Complaint Chief Complaint  Patient presents with  . Back Pain  . Abdominal Pain    HPI Russell [name] is a 28 y.o. male.   28 year old male comes in for back pain and abdominal pain. States he first started with lumbar midline pain 10/2018. Denies injury/trauma. Pain had been intermittent. States for the past month, pain has become more severe, and now bilateral lumbar region. States pain is mostly at night with a laying position. Sharp pain that shoots across to bilateral sides and occasionally radiates down bilateral lower extremity. He has had trouble falling asleep at night, and staying asleep due to the pain. He took one of his old muscle relaxant last night, and started experiencing generalized abdominal pain. He then realized that the old muscle relaxant has expired for 3 years. He denies nausea, vomiting, diarrhea, but has felt more bloated. He has not had oral intake since pain started. He denies urinary symptoms such as frequency, dysuria, hematuria. He works as a Administrator, and does heavy lifting, pushing.      Past Medical History:  Diagnosis Date  . Allergy   . Anxiety   . Asthma   . Obesity     Patient Active Problem List   Diagnosis Date Noted  . GAD (generalized anxiety disorder) 12/10/2015  . Tinnitus 04/09/2015  . Bronchospasm 03/02/2015  . Allergy 03/02/2015  . 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 BMI 36.0-36.9,adult 11/02/2014    Past Surgical History:  Procedure Laterality Date  . TONSILLECTOMY    . TYMPANOSTOMY TUBE PLACEMENT         Home Medications    Prior to Admission medications   Medication Sig Start Date End Date Taking? Authorizing Provider  albuterol (PROVENTIL HFA;VENTOLIN HFA) 108 (90 Base) MCG/ACT inhaler Inhale into the lungs every 6 (six) hours as needed for wheezing or shortness of breath.   Yes [provider]   EPINEPHrine (EPIPEN) 0.3 mg/0.3 mL DEVI Inject 0.3 mLs (0.3 mg total) into the muscle once. 07/11/12   Nelva Nay, PA-C  methocarbamol (ROBAXIN) 500 MG tablet Take 1 tablet (500 mg total) by mouth 2 (two) times daily. 03/31/19   Cathie Hoops, Franci Oshana V, PA-C  predniSONE (DELTASONE) 50 MG tablet Take 1 tablet (50 mg total) by mouth daily. 03/31/19   Cathie Hoops, Briggette Najarian V, PA-C  tamsulosin (FLOMAX) 0.4 MG CAPS capsule Take 1 capsule (0.4 mg total) by mouth daily. 03/31/19   Belinda Fisher, PA-C    Family History Family History  Problem Relation Age of Onset  . Hypertension Mother   . Mental illness Mother   . Stroke Father   . Heart disease Maternal Grandfather   . Mental retardation Maternal Uncle     Social History Social History   Tobacco Use  . Smoking status: Never Smoker  . Smokeless tobacco: Current User    Types: Chew  Substance Use Topics  . Alcohol use: Yes    Comment: occasionally  . Drug use: No     Allergies   Penicillins   Review of Systems Review of Systems  Reason unable to perform ROS: See HPI as above.     Physical Exam Triage Vital Signs ED Triage Vitals  Enc Vitals Group     BP 03/31/19 1333 108/65     Pulse Rate 03/31/19 1333 92     Resp 03/31/19 1333  16     Temp 03/31/19 1333 98 F (36.7 C)     Temp src --      SpO2 03/31/19 1333 96 %     Weight --      Height --      Head Circumference --      Peak Flow --      Pain Score 03/31/19 1335 4     Pain Loc --      Pain Edu? --      Excl. in GC? --    No data found.  Updated Vital Signs BP 108/65   Pulse 92   Temp 98 F (36.7 C)   Resp 16   SpO2 96%   Physical Exam Constitutional:      General: He is not in acute distress.    Appearance: He is well-developed. He is not ill-appearing, toxic-appearing or diaphoretic.  HENT:     Head: Normocephalic and atraumatic.  Cardiovascular:     Rate and Rhythm: Normal rate and regular rhythm.     Heart sounds: Normal heart sounds. No murmur. No friction rub. No  gallop.   Pulmonary:     Effort: Pulmonary effort is normal.     Breath sounds: Normal breath sounds. No wheezing or rales.  Abdominal:     General: Bowel sounds are normal.     Palpations: Abdomen is soft.     Tenderness: There is generalized abdominal tenderness. There is no right CVA tenderness, left CVA tenderness, guarding or rebound.  Musculoskeletal:     Comments: Mild tenderness to palpation of distal lumbar region diffusely. Mild tenderness over midline. No obvious point tenderness to spinous processes. Full ROM of back and hips. Strength normal and equal bilaterally. Sensation normal and equal bilaterally. Negative straight leg raise.   Skin:    General: Skin is warm and dry.  Neurological:     Mental Status: He is alert and oriented to person, place, and time.  Psychiatric:        Behavior: Behavior normal.        Judgment: Judgment normal.      UC Treatments / Results  Labs (all labs ordered are listed, but only abnormal results are displayed) Labs Reviewed  POCT URINALYSIS DIP (DEVICE) - Abnormal; Notable for the following components:      Result Value   Bilirubin Urine SMALL (*)    Hgb urine dipstick TRACE (*)    All other components within normal limits    EKG None  Radiology No results found.  Procedures Procedures (including critical care time)  Medications Ordered in UC Medications  ketorolac (TORADOL) 30 MG/ML injection 30 mg (30 mg Intramuscular Given 03/31/19 1408)    Initial Impression / Assessment and Plan / UC Course  I have reviewed the triage vital signs and the nursing notes.  Pertinent labs & imaging results that were available during my care of the patient were reviewed by me and considered in my medical decision making (see chart for details).    Trace blood in urine. ?urethral stone, though seems unlikely with chronic back pain. Will still provide flomax for a few days and strainer for patient to monitor. Abdominal pain onset after  taking expired medication. No alarming sign on exam at this time, will also have patient continue to monitor. Push fluids. Will provide robaxin and prednisone if symptoms still not improving for back pain. Return precautions given. Patient expresses understanding and agrees to plan.  Final Clinical Impressions(s) /  UC Diagnoses   Final diagnoses:  Chronic bilateral low back pain without sciatica  Generalized abdominal pain   ED Prescriptions    Medication Sig Dispense Auth. Provider   predniSONE (DELTASONE) 50 MG tablet Take 1 tablet (50 mg total) by mouth daily. 5 tablet Marites Nath V, PA-C   methocarbamol (ROBAXIN) 500 MG tablet Take 1 tablet (500 mg total) by mouth 2 (two) times daily. 20 tablet Treson Laura V, PA-C   tamsulosin (FLOMAX) 0.4 MG CAPS capsule Take 1 capsule (0.4 mg total) by mouth daily. 10 capsule Threasa AlphaYu, Kelse Ploch V, PA-C        Deshana Rominger V, [place] JerseyPA-[place] 03/31/19 1438

## 2019-03-31 NOTE — ED Notes (Signed)
Patient verbalizes understanding of discharge instructions. Opportunity for questioning and answers were provided. Patient discharged from [facility] by RN.  

## 2019-04-16 ENCOUNTER — Other Ambulatory Visit: Payer: Self-pay

## 2019-04-16 ENCOUNTER — Ambulatory Visit (HOSPITAL_COMMUNITY)
Admission: EM | Admit: 2019-04-16 | Discharge: 2019-04-16 | Disposition: A | Discharge status: Home / Self Care | Payer: Self-pay | Attending: Family Medicine | Admitting: Family Medicine

## 2019-04-16 ENCOUNTER — Encounter (HOSPITAL_COMMUNITY): Payer: Self-pay | Admitting: Emergency Medicine

## 2019-04-16 DIAGNOSIS — J4521 Mild intermittent asthma with (acute) exacerbation: Secondary | ICD-10-CM

## 2019-04-16 MED ORDER — ALBUTEROL SULFATE HFA 108 (90 BASE) MCG/ACT IN AERS
INHALATION_SPRAY | RESPIRATORY_TRACT | Status: AC
  Filled 2019-04-16: qty 6.7

## 2019-04-16 MED ORDER — ALBUTEROL SULFATE HFA 108 (90 BASE) MCG/ACT IN AERS: 1.0000 | INHALATION_SPRAY | Freq: Four times a day (QID) | RESPIRATORY_TRACT | 0 refills | Status: DC | PRN

## 2019-04-16 MED ORDER — ALBUTEROL SULFATE HFA 108 (90 BASE) MCG/ACT IN AERS
2.0000 | INHALATION_SPRAY | Freq: Once | RESPIRATORY_TRACT | Status: AC
  Administered 2019-04-16: 20:00:00 2 via RESPIRATORY_TRACT

## 2019-04-16 NOTE — ED Triage Notes (Signed)
Pt here for refill on asthma meds

## 2019-04-16 NOTE — Discharge Instructions (Signed)
Continue albuterol as needed Follow up with primary care for further management of asthma Return if symptoms worsening

## 2019-04-17 NOTE — ED Provider Notes (Signed)
[facility]    CSN: [id number] Arrival date & time: 04/16/19  1918     History   Chief Complaint Chief Complaint  Patient presents with  . Medication Refill  . Asthma    HPI Russell [name] is a 28 y.o. male history of asthma, anxiety, and allergies presenting today for refill of this asthma inhaler. He states he has been out of his albuterol inhaler for the past 3-4 days. Notices symptoms mainly at night time with shortness of breath and wheezing. Also states he is concerned he may have sleep apnea as his girlfireind has witnessed him not breathing at night followed by awakening and shortness of breath. Has previously been on dulera and symbicort. Unable to afford daily maintenance inhalers as this time as he currently does not have insurance. Does not have a PCP. Denies associated cough, congestion, ST, fever, chills, bodyaches.   HPI  Past Medical History:  Diagnosis Date  . Allergy   . Anxiety   . Asthma   . Obesity     Patient Active Problem List   Diagnosis Date Noted  . GAD (generalized anxiety disorder) 12/10/2015  . Tinnitus 04/09/2015  . Bronchospasm 03/02/2015  . Allergy 03/02/2015  . 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 BMI 36.0-36.9,adult 11/02/2014    Past Surgical History:  Procedure Laterality Date  . TONSILLECTOMY    . TYMPANOSTOMY TUBE PLACEMENT         Home Medications    Prior to Admission medications   Medication Sig Start Date End Date Taking? Authorizing Provider  albuterol (VENTOLIN HFA) 108 (90 Base) MCG/ACT inhaler Inhale 1-2 puffs into the lungs every 6 (six) hours as needed for wheezing or shortness of breath. 04/16/19   Denney Shein C, PA-C  EPINEPHrine (EPIPEN) 0.3 mg/0.3 mL DEVI Inject 0.3 mLs (0.3 mg total) into the muscle once. 07/11/12   Nelva Nay, PA-C  methocarbamol (ROBAXIN) 500 MG tablet Take 1 tablet (500 mg total) by mouth 2 (two) times daily. 03/31/19   Cathie Hoops, Amy V, PA-C  predniSONE (DELTASONE) 50 MG tablet  Take 1 tablet (50 mg total) by mouth daily. 03/31/19   Cathie Hoops, Amy V, PA-C  tamsulosin (FLOMAX) 0.4 MG CAPS capsule Take 1 capsule (0.4 mg total) by mouth daily. 03/31/19   Belinda Fisher, PA-C    Family History Family History  Problem Relation Age of Onset  . Hypertension Mother   . Mental illness Mother   . Stroke Father   . Heart disease Maternal Grandfather   . Mental retardation Maternal Uncle     Social History Social History   Tobacco Use  . Smoking status: Never Smoker  . Smokeless tobacco: Current User    Types: Chew  Substance Use Topics  . Alcohol use: Yes    Comment: occasionally  . Drug use: No     Allergies   Penicillins   Review of Systems Review of Systems  Constitutional: Negative for activity change, appetite change, chills, fatigue and fever.  HENT: Negative for congestion, ear pain, rhinorrhea, sinus pressure, sore throat and trouble swallowing.   Eyes: Negative for discharge and redness.  Respiratory: Positive for shortness of breath and wheezing. Negative for cough and chest tightness.   Cardiovascular: Negative for chest pain.  Gastrointestinal: Negative for abdominal pain, diarrhea, nausea and vomiting.  Musculoskeletal: Negative for myalgias.  Skin: Negative for rash.  Neurological: Negative for dizziness, light-headedness and headaches.     Physical Exam Triage Vital Signs ED Triage Vitals  Enc Vitals Group     BP 04/16/19 1938 130/90     Pulse Rate 04/16/19 1938 69     Resp 04/16/19 1938 18     Temp 04/16/19 1938 98.3 F (36.8 C)     Temp Source 04/16/19 1938 Oral     SpO2 04/16/19 1938 97 %     Weight --      Height --      Head Circumference --      Peak Flow --      Pain Score 04/16/19 1939 0     Pain Loc --      Pain Edu? --      Excl. in GC? --    No data found.  Updated Vital Signs BP 130/90 (BP Location: Right Arm)   Pulse 69   Temp 98.3 F (36.8 C) (Oral)   Resp 18   SpO2 97%   Visual Acuity Right Eye Distance:    Left Eye Distance:   Bilateral Distance:    Right Eye Near:   Left Eye Near:    Bilateral Near:     Physical Exam Vitals signs and nursing note reviewed.  Constitutional:      Appearance: He is well-developed.  HENT:     Head: Normocephalic and atraumatic.     Ears:     Comments: Bilateral ears without tenderness to palpation of external auricle, tragus and mastoid, EAC's without erythema or swelling, TM's with good bony landmarks and cone of light. Non erythematous.     Mouth/Throat:     Comments: Oral mucosa pink and moist, no tonsillar enlargement or exudate. Posterior pharynx patent and nonerythematous, no uvula deviation or swelling. Normal phonation. Eyes:     Conjunctiva/sclera: Conjunctivae normal.  Neck:     Musculoskeletal: Neck supple.  Cardiovascular:     Rate and Rhythm: Normal rate and regular rhythm.     Heart sounds: No murmur.  Pulmonary:     Effort: Pulmonary effort is normal. No respiratory distress.     Breath sounds: Normal breath sounds.     Comments: Breathing comfortably at rest, CTABL, no wheezing, rales or other adventitious sounds auscultated  Abdominal:     Palpations: Abdomen is soft.     Tenderness: There is no abdominal tenderness.  Skin:    General: Skin is warm and dry.  Neurological:     Mental Status: He is alert.      UC Treatments / Results  Labs (all labs ordered are listed, but only abnormal results are displayed) Labs Reviewed - No data to display  EKG None  Radiology No results found.  Procedures Procedures (including critical care time)  Medications Ordered in UC Medications  albuterol (VENTOLIN HFA) 108 (90 Base) MCG/ACT inhaler 2 puff (2 puffs Inhalation Given 04/16/19 2003)    Initial Impression / Assessment and Plan / UC Course  I have reviewed the triage vital signs and the nursing notes.  Pertinent labs & imaging results that were available during my care of the patient were reviewed by me and considered in  my medical decision making (see chart for details).     Refilled albuterol, provided albuterol prior to discharge as well. Deferring steroid as lungs clear during visit, and recent steroid course for back. Discussed getting set up with PCP as often have programs to help with cost of inhalers. Discussed strict return precautions. Patient verbalized understanding and is agreeable with plan.  Final Clinical Impressions(s) / UC Diagnoses   Final  diagnoses:  Mild intermittent asthma with exacerbation     Discharge Instructions     Continue albuterol as needed Follow up with primary care for further management of asthma Return if symptoms worsening   ED Prescriptions    Medication Sig Dispense Auth. Provider   albuterol (VENTOLIN HFA) 108 (90 Base) MCG/ACT inhaler Inhale 1-2 puffs into the lungs every 6 (six) hours as needed for wheezing or shortness of breath. 1 Inhaler Ameera Tigue C, PA-C     Controlled Substance Prescriptions [place] Controlled Substance Registry consulted? Not Applicable   Lew DawesWieters, Red Mandt C, [place]PA-C 04/17/19 1155

## 2019-06-20 ENCOUNTER — Encounter (HOSPITAL_COMMUNITY): Payer: Self-pay | Admitting: Emergency Medicine

## 2019-06-20 ENCOUNTER — Other Ambulatory Visit: Payer: Self-pay

## 2019-06-20 ENCOUNTER — Ambulatory Visit (HOSPITAL_COMMUNITY)
Admission: EM | Admit: 2019-06-20 | Discharge: 2019-06-20 | Disposition: A | Discharge status: Home / Self Care | Payer: Self-pay | Attending: Internal Medicine | Admitting: Internal Medicine

## 2019-06-20 DIAGNOSIS — R21 Rash and other nonspecific skin eruption: Secondary | ICD-10-CM | POA: Insufficient documentation

## 2019-06-20 MED ORDER — HYDROCORTISONE 1 % EX CREA: TOPICAL_CREAM | CUTANEOUS | 0 refills | Status: AC

## 2019-06-20 MED ORDER — MICONAZOLE NITRATE 2 % EX CREA: 1.0000 "application " | TOPICAL_CREAM | Freq: Two times a day (BID) | CUTANEOUS | 0 refills | Status: AC

## 2019-06-20 MED ORDER — ALBUTEROL SULFATE HFA 108 (90 BASE) MCG/ACT IN AERS: 1.0000 | INHALATION_SPRAY | Freq: Four times a day (QID) | RESPIRATORY_TRACT | 0 refills | Status: DC | PRN

## 2019-06-20 NOTE — ED Provider Notes (Signed)
[facility]    CSN: [id number] Arrival date & time: 06/20/19  1358      History   Chief Complaint Chief Complaint  Patient presents with  . SEXUALLY TRANSMITTED DISEASE    HPI Russell [name] is a 28 y.o. male history of asthma-on albuterol, anxiety comes to urgent care with complaints of painless penile sores which started about a week ago.  Patient says that symptoms started a day after he engaged in unprotected sexual intercourse with his regular partner. They had both vaginal and anal intercourse.   He denies any pain or burning sensation.  No groin pain.  No penile discharge.  No scrotal tenderness or testicular tenderness.  Patient has not tried any over-the-counter remedies.  HPI  Past Medical History:  Diagnosis Date  . Allergy   . Anxiety   . Asthma   . Obesity     Patient Active Problem List   Diagnosis Date Noted  . GAD (generalized anxiety disorder) 12/10/2015  . Tinnitus 04/09/2015  . Bronchospasm 03/02/2015  . Allergy 03/02/2015  . Complicated grief 00/93/8182  . BMI 36.0-36.9,adult 11/02/2014    Past Surgical History:  Procedure Laterality Date  . TONSILLECTOMY    . TYMPANOSTOMY TUBE PLACEMENT         Home Medications    Prior to Admission medications   Medication Sig Start Date End Date Taking? Authorizing Provider  albuterol (VENTOLIN HFA) 108 (90 Base) MCG/ACT inhaler Inhale 1-2 puffs into the lungs every 6 (six) hours as needed for wheezing or shortness of breath. 04/16/19  Yes Wieters, Hallie C, PA-C  EPINEPHrine (EPIPEN) 0.3 mg/0.3 mL DEVI Inject 0.3 mLs (0.3 mg total) into the muscle once. 07/11/12   Collene Leyden, PA-C  methocarbamol (ROBAXIN) 500 MG tablet Take 1 tablet (500 mg total) by mouth 2 (two) times daily. 03/31/19   Tasia Catchings, Amy V, PA-C  predniSONE (DELTASONE) 50 MG tablet Take 1 tablet (50 mg total) by mouth daily. 03/31/19   Tasia Catchings, Amy V, PA-C  tamsulosin (FLOMAX) 0.4 MG CAPS capsule Take 1 capsule (0.4 mg total) by mouth  daily. 03/31/19   Ok Edwards, PA-C    Family History Family History  Problem Relation Age of Onset  . Hypertension Mother   . Mental illness Mother   . Stroke Father   . Heart disease Maternal Grandfather   . Mental retardation Maternal Uncle     Social History Social History   Tobacco Use  . Smoking status: Never Smoker  . Smokeless tobacco: Current User    Types: Chew  Substance Use Topics  . Alcohol use: Yes    Comment: occasionally  . Drug use: No     Allergies   Penicillins   Review of Systems Review of Systems  Constitutional: Negative.   HENT: Negative.   Respiratory: Negative.  Negative for choking, shortness of breath and wheezing.   Cardiovascular: Negative.   Gastrointestinal: Negative.   Genitourinary: Negative for discharge, dysuria, enuresis, flank pain, frequency, hematuria, penile pain, penile swelling, scrotal swelling, testicular pain and urgency.  Skin: Positive for rash.  Neurological: Negative.      Physical Exam Triage Vital Signs ED Triage Vitals  Enc Vitals Group     BP 06/20/19 1433 121/80     Pulse Rate 06/20/19 1433 94     Resp 06/20/19 1433 12     Temp 06/20/19 1433 98.9 F (37.2 C)     Temp Source 06/20/19 1433 Oral  SpO2 06/20/19 1433 96 %     Weight --      Height --      Head Circumference --      Peak Flow --      Pain Score 06/20/19 1431 0     Pain Loc --      Pain Edu? --      Excl. in GC? --    No data found.  Updated Vital Signs BP 121/80 (BP Location: Left Arm)   Pulse 94   Temp 98.9 F (37.2 C) (Oral)   Resp 12   SpO2 96%   Visual Acuity Right Eye Distance:   Left Eye Distance:   Bilateral Distance:    Right Eye Near:   Left Eye Near:    Bilateral Near:     Physical Exam Neck:     Musculoskeletal: Normal range of motion.  Cardiovascular:     Rate and Rhythm: Normal rate and regular rhythm.  Pulmonary:     Effort: Pulmonary effort is normal.     Breath sounds: Normal breath sounds.   Abdominal:     General: Bowel sounds are normal.     Palpations: Abdomen is soft.  Genitourinary:    Comments: Papular rash on the shaft of the penis. No ulcerations. No groin swelling, rash or ulceration. Musculoskeletal: Normal range of motion.  Skin:    General: Skin is warm.     Capillary Refill: Capillary refill takes less than 2 seconds.      UC Treatments / Results  Labs (all labs ordered are listed, but only abnormal results are displayed) Labs Reviewed - No data to display  EKG   Radiology No results found.  Procedures Procedures (including critical care time)  Medications Ordered in UC Medications - No data to display  Initial Impression / Assessment and Plan / UC Course  I have reviewed the triage vital signs and the nursing notes.  Pertinent labs & imaging results that were available during my care of the patient were reviewed by me and considered in my medical decision making (see chart for details).     1. Penile rash: Urine for GC chlamydia  RPR Miconazole cream 2% Hydrocortisone cream.  2.  Mild intermittent asthma: Refill albuterol prescription. Final Clinical Impressions(s) / UC Diagnoses   Final diagnoses:  None   Discharge Instructions   None    ED Prescriptions    None     Controlled Substance Prescriptions [place] Controlled Substance Registry consulted? No   Merrilee JanskyLamptey, Arnol Mcgibbon O, MD 06/20/19 [id number]

## 2019-06-20 NOTE — ED Triage Notes (Signed)
Pt reports several open sores on his penis that he discovered last week.  He states they are healing now.  Pt has been with the same partner for over 1 year and they don't use protection.  He also admits to anal penetration with his girlfriend.  Pt denies any urinary issues and no abdominal pain he also denies any penile discharge.

## 2019-06-21 LAB — RPR: RPR Ser Ql: NONREACTIVE

## 2019-06-25 LAB — URINE CYTOLOGY ANCILLARY ONLY
Chlamydia: POSITIVE — AB
Neisseria Gonorrhea: NEGATIVE

## 2019-06-26 LAB — URINE CYTOLOGY ANCILLARY ONLY: Candida vaginitis: NEGATIVE

## 2019-06-27 ENCOUNTER — Telehealth (HOSPITAL_COMMUNITY): Payer: Self-pay | Admitting: Emergency Medicine

## 2019-06-27 MED ORDER — AZITHROMYCIN 250 MG PO TABS: 1000.0000 mg | ORAL_TABLET | Freq: Once | ORAL | 0 refills | Status: AC

## 2019-06-27 NOTE — Telephone Encounter (Signed)
Chlamydia is positive.  Rx po zithromax 1g #1 dose no refills was sent to the pharmacy of record.  Pt needs education to please refrain from sexual intercourse for 7 days to give the medicine time to work, sexual partners need to be notified and tested/treated.  Condoms may reduce risk of reinfection.  Recheck or followup with PCP for further evaluation if symptoms are not improving.   [facility] notified.  Patient contacted and made aware of all results, all questions answered.    

## 2019-08-05 ENCOUNTER — Encounter (HOSPITAL_COMMUNITY): Payer: Self-pay | Admitting: Emergency Medicine

## 2019-08-05 ENCOUNTER — Other Ambulatory Visit: Payer: Self-pay

## 2019-08-05 ENCOUNTER — Ambulatory Visit (HOSPITAL_COMMUNITY)
Admission: EM | Admit: 2019-08-05 | Discharge: 2019-08-05 | Disposition: A | Discharge status: Home / Self Care | Payer: Self-pay | Attending: Family Medicine | Admitting: Family Medicine

## 2019-08-05 DIAGNOSIS — Z880 Allergy status to penicillin: Secondary | ICD-10-CM | POA: Insufficient documentation

## 2019-08-05 DIAGNOSIS — Z20822 Contact with and (suspected) exposure to covid-19: Secondary | ICD-10-CM

## 2019-08-05 DIAGNOSIS — Z79899 Other long term (current) drug therapy: Secondary | ICD-10-CM | POA: Insufficient documentation

## 2019-08-05 DIAGNOSIS — Z20828 Contact with and (suspected) exposure to other viral communicable diseases: Secondary | ICD-10-CM | POA: Insufficient documentation

## 2019-08-05 DIAGNOSIS — J4521 Mild intermittent asthma with (acute) exacerbation: Secondary | ICD-10-CM | POA: Insufficient documentation

## 2019-08-05 MED ORDER — PREDNISONE 20 MG PO TABS: 20.0000 mg | ORAL_TABLET | Freq: Two times a day (BID) | ORAL | 0 refills | Status: DC

## 2019-08-05 NOTE — ED Triage Notes (Signed)
Pt presents to [facility] for assessment after he began to have SOB and headache starting last Thursday.  States he had a dry cough Saturday, but not anymore.  States he has been feeling fatigued and weak.  Denies fevers or chills.

## 2019-08-05 NOTE — ED Notes (Signed)
Patient able to ambulate independently  

## 2019-08-05 NOTE — Discharge Instructions (Signed)
Take prednisone 2 times a day for 5 days Continue albuterol inhaler  You must quarantine until your coronavirus test is available Call if you need additional information or to change your note     Person Under Monitoring Name: Russell [name]  Location: [address] [place] [address]   Infection Prevention Recommendations for Individuals Confirmed to have, or Being Evaluated for, 2019 Novel Coronavirus (COVID-19) Infection Who Receive Care at Home  Individuals who are confirmed to have, or are being evaluated for, COVID-19 should follow the prevention steps below until a healthcare provider or local or state health department says they can return to normal activities.  Stay home except to get medical care You should restrict activities outside your home, except for getting medical care. Do not go to work, school, or public areas, and do not use public transportation or taxis.  Call ahead before visiting your doctor Before your medical appointment, call the healthcare provider and tell them that you have, or are being evaluated for, COVID-19 infection. This will help the healthcare providers office take steps to keep other people from getting infected. Ask your healthcare provider to call the local or state health department.  Monitor your symptoms Seek prompt medical attention if your illness is worsening (e.g., difficulty breathing). Before going to your medical appointment, call the healthcare provider and tell them that you have, or are being evaluated for, COVID-19 infection. Ask your healthcare provider to call the local or state health department.  Wear a facemask You should wear a facemask that covers your nose and mouth when you are in the same room with other people and when you visit a healthcare provider. People who live with or visit you should also wear a facemask while they are in the same room with you.  Separate yourself from other people in your  home As much as possible, you should stay in a different room from other people in your home. Also, you should use a separate bathroom, if available.  Avoid sharing household items You should not share dishes, drinking glasses, cups, eating utensils, towels, bedding, or other items with other people in your home. After using these items, you should wash them thoroughly with soap and water.  Cover your coughs and sneezes Cover your mouth and nose with a tissue when you cough or sneeze, or you can cough or sneeze into your sleeve. Throw used tissues in a lined trash can, and immediately wash your hands with soap and water for at least 20 seconds or use an alcohol-based hand rub.  Wash your [facility] your hands often and thoroughly with soap and water for at least 20 seconds. You can use an alcohol-based hand sanitizer if soap and water are not available and if your hands are not visibly dirty. Avoid touching your eyes, nose, and mouth with unwashed hands.   Prevention Steps for Caregivers and Household Members of Individuals Confirmed to have, or Being Evaluated for, COVID-19 Infection Being Cared for in the Home  If you live with, or provide care at home for, a person confirmed to have, or being evaluated for, COVID-19 infection please follow these guidelines to prevent infection:  Follow healthcare providers instructions Make sure that you understand and can help the patient follow any healthcare provider instructions for all care.  Provide for the patients basic needs You should help the patient with basic needs in the home and provide support for getting groceries, prescriptions, and other personal needs.  Monitor the patients symptoms If they are getting sicker, call his or her medical provider and tell them that the patient has, or is being evaluated for, COVID-19 infection. This will help the healthcare providers office take steps to keep other people from getting  infected. Ask the healthcare provider to call the local or state health department.  Limit the number of people who have contact with the patient If possible, have only one caregiver for the patient. Other household members should stay in another home or place of residence. If this is not possible, they should stay in another room, or be separated from the patient as much as possible. Use a separate bathroom, if available. Restrict visitors who do not have an essential need to be in the home.  Keep older adults, very young children, and other sick people away from the patient Keep older adults, very young children, and those who have compromised immune systems or chronic health conditions away from the patient. This includes people with chronic heart, lung, or kidney conditions, diabetes, and cancer.  Ensure good ventilation Make sure that shared spaces in the home have good air flow, such as from an air conditioner or an opened window, weather permitting.  Wash your hands often Wash your hands often and thoroughly with soap and water for at least 20 seconds. You can use an alcohol based hand sanitizer if soap and water are not available and if your hands are not visibly dirty. Avoid touching your eyes, nose, and mouth with unwashed hands. Use disposable paper towels to dry your hands. If not available, use dedicated cloth towels and replace them when they become wet.  Wear a facemask and gloves Wear a disposable facemask at all times in the room and gloves when you touch or have contact with the patients blood, body fluids, and/or secretions or excretions, such as sweat, saliva, sputum, nasal mucus, vomit, urine, or feces.  Ensure the mask fits over your nose and mouth tightly, and do not touch it during use. Throw out disposable facemasks and gloves after using them. Do not reuse. Wash your hands immediately after removing your facemask and gloves. If your personal clothing becomes  contaminated, carefully remove clothing and launder. Wash your hands after handling contaminated clothing. Place all used disposable facemasks, gloves, and other waste in a lined container before disposing them with other household waste. Remove gloves and wash your hands immediately after handling these items.  Do not share dishes, glasses, or other household items with the patient Avoid sharing household items. You should not share dishes, drinking glasses, cups, eating utensils, towels, bedding, or other items with a patient who is confirmed to have, or being evaluated for, COVID-19 infection. After the person uses these items, you should wash them thoroughly with soap and water.  Wash laundry thoroughly Immediately remove and wash clothes or bedding that have blood, body fluids, and/or secretions or excretions, such as sweat, saliva, sputum, nasal mucus, vomit, urine, or feces, on them. Wear gloves when handling laundry from the patient. Read and follow directions on labels of laundry or clothing items and detergent. In general, wash and dry with the warmest temperatures recommended on the label.  Clean all areas the individual has used often Clean all touchable surfaces, such as counters, tabletops, doorknobs, bathroom fixtures, toilets, phones, keyboards, tablets, and bedside tables, every day. Also, clean any surfaces that may have blood, body fluids, and/or secretions or excretions on them. Wear gloves when cleaning surfaces the patient has  come in contact with. Use a diluted bleach solution (e.g., dilute bleach with 1 part bleach and 10 parts water) or a household disinfectant with a label that says EPA-registered for coronaviruses. To make a bleach solution at home, add 1 tablespoon of bleach to 1 quart (4 cups) of water. For a larger supply, add  cup of bleach to 1 gallon (16 cups) of water. Read labels of cleaning products and follow recommendations provided on product labels. Labels  contain instructions for safe and effective use of the cleaning product including precautions you should take when applying the product, such as wearing gloves or eye protection and making sure you have good ventilation during use of the product. Remove gloves and wash hands immediately after cleaning.  Monitor yourself for signs and symptoms of illness Caregivers and household members are considered close contacts, should monitor their health, and will be asked to limit movement outside of the home to the extent possible. Follow the monitoring steps for close contacts listed on the symptom monitoring form.   ? If you have additional questions, contact your local health department or call the epidemiologist on call at [phone] (available 24/7). ? This guidance is subject to change. For the most up-to-date guidance from [facility], please refer to their website: [email or web address]

## 2019-08-06 NOTE — ED Provider Notes (Signed)
[name]    CSN: [id number] Arrival date & time: 08/05/19  1751      History   Chief Complaint Chief Complaint  Patient presents with  . Fatigue    HPI Russell [name] is a 28 y.o. male.   HPI patient has had an increase in his shortness of breath.  Using albuterol more often.  He feels like it is a flare of his asthma.  He is also had some tiredness and headache.  He had a dry cough for 1 day but this is gone.  Does feel more tired than usual and weak but not really body aches or flu symptoms.  No nausea vomiting diarrhea.  No sore throat or runny/stuffy nose.  He agrees to covid testing.  Past Medical History:  Diagnosis Date  . Allergy   . Anxiety   . Asthma   . Obesity     Patient Active Problem List   Diagnosis Date Noted  . GAD (generalized anxiety disorder) 12/10/2015  . Tinnitus 04/09/2015  . Bronchospasm 03/02/2015  . Allergy 03/02/2015  . Complicated grief 57/84/6962  . BMI 36.0-36.9,adult 11/02/2014    Past Surgical History:  Procedure Laterality Date  . TONSILLECTOMY    . TYMPANOSTOMY TUBE PLACEMENT         Home Medications    Prior to Admission medications   Medication Sig Start Date End Date Taking? Authorizing Provider  albuterol (VENTOLIN HFA) 108 (90 Base) MCG/ACT inhaler Inhale 1-2 puffs into the lungs every 6 (six) hours as needed for wheezing or shortness of breath. 06/20/19   Chase Picket, MD  EPINEPHrine (EPIPEN) 0.3 mg/0.3 mL DEVI Inject 0.3 mLs (0.3 mg total) into the muscle once. 07/11/12   Collene Leyden, PA-C  predniSONE (DELTASONE) 20 MG tablet Take 1 tablet (20 mg total) by mouth 2 (two) times daily with a meal. 08/05/19   Raylene Everts, MD    Family History Family History  Problem Relation Age of Onset  . Hypertension Mother   . Mental illness Mother   . Stroke Father   . Heart disease Maternal Grandfather   . Mental retardation Maternal Uncle     Social History Social History   Tobacco Use  .  Smoking status: Never Smoker  . Smokeless tobacco: Current User    Types: Chew  Substance Use Topics  . Alcohol use: Yes    Comment: occasionally  . Drug use: No     Allergies   Penicillins   Review of Systems Review of Systems  Constitutional: Positive for fatigue. Negative for chills and fever.  HENT: Negative for ear pain and sore throat.   Eyes: Negative for pain and visual disturbance.  Respiratory: Positive for shortness of breath and wheezing. Negative for cough.   Cardiovascular: Negative for chest pain and palpitations.  Gastrointestinal: Negative for abdominal pain and vomiting.  Genitourinary: Negative for dysuria and hematuria.  Musculoskeletal: Negative for arthralgias and back pain.  Skin: Negative for color change and rash.  Neurological: Negative for seizures and syncope.  All other systems reviewed and are negative.    Physical Exam Triage Vital Signs ED Triage Vitals [08/05/19 1850]  Enc Vitals Group     BP 124/81     Pulse Rate 92     Resp 18     Temp 98.6 F (37 C)     Temp Source Oral     SpO2 99 %     Weight  Height      Head Circumference      Peak Flow      Pain Score 3     Pain Loc      Pain Edu?      Excl. in GC?    No data found.  Updated Vital Signs BP 124/81 (BP Location: Left Arm)   Pulse 92   Temp 98.6 F (37 C) (Oral)   Resp 18   SpO2 99%   Visual Acuity Right Eye Distance:   Left Eye Distance:   Bilateral Distance:    Right Eye Near:   Left Eye Near:    Bilateral Near:     Physical Exam Constitutional:      General: He is not in acute distress.    Appearance: He is well-developed. He is obese.  HENT:     Head: Normocephalic and atraumatic.     Nose: Nose normal. No congestion.     Mouth/Throat:     Mouth: Mucous membranes are moist.     Pharynx: No posterior oropharyngeal erythema.  Eyes:     Conjunctiva/sclera: Conjunctivae normal.     Pupils: Pupils are equal, round, and reactive to light.  Neck:      Musculoskeletal: Normal range of motion.  Cardiovascular:     Rate and Rhythm: Normal rate and regular rhythm.     Heart sounds: Normal heart sounds.  Pulmonary:     Effort: Pulmonary effort is normal. No respiratory distress.     Breath sounds: Normal breath sounds.     Comments: Breath sounds are normal.  No rales or rhonchi.  No wheeze Abdominal:     General: There is no distension.     Palpations: Abdomen is soft.  Musculoskeletal: Normal range of motion.  Lymphadenopathy:     Cervical: No cervical adenopathy.  Skin:    General: Skin is warm and dry.  Neurological:     Mental Status: He is alert.   I discussed with patient that if he feels short of breath, he does not have wheezing we will have to do a chest x-ray.  Patient declines.  He states he will come back for chest x-ray if he does not improve with prednisone   UC Treatments / Results  Labs (all labs ordered are listed, but only abnormal results are displayed) Labs Reviewed  NOVEL CORONAVIRUS, NAA (HOSPITAL ORDER, SEND-OUT TO REF LAB)    EKG   Radiology No results found.  Procedures Procedures (including critical care time)  Medications Ordered in UC Medications - No data to display  Initial Impression / Assessment and Plan / UC Course  I have reviewed the triage vital signs and the nursing notes.  Pertinent labs & imaging results that were available during my care of the patient were reviewed by me and considered in my medical decision making (see chart for details).      Final Clinical Impressions(s) / UC Diagnoses   Final diagnoses:  Mild intermittent asthma with acute exacerbation  Suspected Covid-19 Virus Infection     Discharge Instructions     Take prednisone 2 times a day for 5 days Continue albuterol inhaler  You must quarantine until your coronavirus test is available Call if you need additional information or to change your note     Person Under Monitoring Name: [name]  Delucchi  Location: [address] [place] [id number]   Infection Prevention Recommendations for Individuals Confirmed to have, or Being Evaluated for, 2019 Novel  Coronavirus (COVID-19) Infection Who Receive Care at Home  Individuals who are confirmed to have, or are being evaluated for, COVID-19 should follow the prevention steps below until a healthcare provider or local or state health department says they can return to normal activities.  Stay home except to get medical care You should restrict activities outside your home, except for getting medical care. Do not go to work, school, or public areas, and do not use public transportation or taxis.  Call ahead before visiting your doctor Before your medical appointment, call the healthcare provider and tell them that you have, or are being evaluated for, COVID-19 infection. This will help the healthcare provider's office take steps to keep other people from getting infected. Ask your healthcare provider to call the local or state health department.  Monitor your symptoms Seek prompt medical attention if your illness is worsening (e.g., difficulty breathing). Before going to your medical appointment, call the healthcare provider and tell them that you have, or are being evaluated for, COVID-19 infection. Ask your healthcare provider to call the local or state health department.  Wear a facemask You should wear a facemask that covers your nose and mouth when you are in the same room with other people and when you visit a healthcare provider. People who live with or visit you should also wear a facemask while they are in the same room with you.  Separate yourself from other people in your home As much as possible, you should stay in a different room from other people in your home. Also, you should use a separate bathroom, if available.  Avoid sharing household items You should not share dishes, drinking glasses, cups, eating  utensils, towels, bedding, or other items with other people in your home. After using these items, you should wash them thoroughly with soap and water.  Cover your coughs and sneezes Cover your mouth and nose with a tissue when you cough or sneeze, or you can cough or sneeze into your sleeve. Throw used tissues in a lined trash can, and immediately wash your hands with soap and water for at least 20 seconds or use an alcohol-based hand rub.  Wash your [employer]hands Wash your hands often and thoroughly with soap and water for at least 20 seconds. You can use an alcohol-based hand sanitizer if soap and water are not available and if your hands are not visibly dirty. Avoid touching your eyes, nose, and mouth with unwashed hands.   Prevention Steps for Caregivers and Household Members of Individuals Confirmed to have, or Being Evaluated for, COVID-19 Infection Being Cared for in the Home  If you live with, or provide care at home for, a person confirmed to have, or being evaluated for, COVID-19 infection please follow these guidelines to prevent infection:  Follow healthcare provider's instructions Make sure that you understand and can help the patient follow any healthcare provider instructions for all care.  Provide for the patient's basic needs You should help the patient with basic needs in the home and provide support for getting groceries, prescriptions, and other personal needs.  Monitor the patient's symptoms If they are getting sicker, call his or her medical provider and tell them that the patient has, or is being evaluated for, COVID-19 infection. This will help the healthcare provider's office take steps to keep other people from getting infected. Ask the healthcare provider to call the local or state health department.  Limit the number of people who have contact with the patient  If possible, have only one caregiver for the patient.  Other household members should stay in  another home or place of residence. If this is not possible, they should stay  in another room, or be separated from the patient as much as possible. Use a separate bathroom, if available.  Restrict visitors who do not have an essential need to be in the home.  Keep older adults, very young children, and other sick people away from the patient Keep older adults, very young children, and those who have compromised immune systems or chronic health conditions away from the patient. This includes people with chronic heart, lung, or kidney conditions, diabetes, and cancer.  Ensure good ventilation Make sure that shared spaces in the home have good air flow, such as from an air conditioner or an opened window, weather permitting.  Wash your hands often  Wash your hands often and thoroughly with soap and water for at least 20 seconds. You can use an alcohol based hand sanitizer if soap and water are not available and if your hands are not visibly dirty.  Avoid touching your eyes, nose, and mouth with unwashed hands.  Use disposable paper towels to dry your hands. If not available, use dedicated cloth towels and replace them when they become wet.  Wear a facemask and gloves  Wear a disposable facemask at all times in the room and gloves when you touch or have contact with the patient's blood, body fluids, and/or secretions or excretions, such as sweat, saliva, sputum, nasal mucus, vomit, urine, or feces.  Ensure the mask fits over your nose and mouth tightly, and do not touch it during use.  Throw out disposable facemasks and gloves after using them. Do not reuse.  Wash your hands immediately after removing your facemask and gloves.  If your personal clothing becomes contaminated, carefully remove clothing and launder. Wash your hands after handling contaminated clothing.  Place all used disposable facemasks, gloves, and other waste in a lined container before disposing them with other  household waste.  Remove gloves and wash your hands immediately after handling these items.  Do not share dishes, glasses, or other household items with the patient  Avoid sharing household items. You should not share dishes, drinking glasses, cups, eating utensils, towels, bedding, or other items with a patient who is confirmed to have, or being evaluated for, COVID-19 infection.  After the person uses these items, you should wash them thoroughly with soap and water.  Wash laundry thoroughly  Immediately remove and wash clothes or bedding that have blood, body fluids, and/or secretions or excretions, such as sweat, saliva, sputum, nasal mucus, vomit, urine, or feces, on them.  Wear gloves when handling laundry from the patient.  Read and follow directions on labels of laundry or clothing items and detergent. In general, wash and dry with the warmest temperatures recommended on the label.  Clean all areas the individual has used often  Clean all touchable surfaces, such as counters, tabletops, doorknobs, bathroom fixtures, toilets, phones, keyboards, tablets, and bedside tables, every day. Also, clean any surfaces that may have blood, body fluids, and/or secretions or excretions on them.  Wear gloves when cleaning surfaces the patient has come in contact with.  Use a diluted bleach solution (e.g., dilute bleach with 1 part bleach and 10 parts water) or a household disinfectant with a label that says EPA-registered for coronaviruses. To make a bleach solution at home, add 1 tablespoon of bleach to 1 quart (4 cups)  of water. For a larger supply, add  cup of bleach to 1 gallon (16 cups) of water.  Read labels of cleaning products and follow recommendations provided on product labels. Labels contain instructions for safe and effective use of the cleaning product including precautions you should take when applying the product, such as wearing gloves or eye protection and making sure you have  good ventilation during use of the product.  Remove gloves and wash hands immediately after cleaning.  Monitor yourself for signs and symptoms of illness Caregivers and household members are considered close contacts, should monitor their health, and will be asked to limit movement outside of the home to the extent possible. Follow the monitoring steps for close contacts listed on the symptom monitoring form.   ? If you have additional questions, contact your local health department or call the epidemiologist on call at [phone] (available 24/7). ? This guidance is subject to change. For the most up-to-date guidance from [facility], please refer to their website: TripMetro.huhttps://www.cdc.gov/coronavirus/2019-ncov/hcp/guidance-prevent-spread.html    ED Prescriptions    Medication Sig Dispense Auth. Provider   predniSONE (DELTASONE) 20 MG tablet Take 1 tablet (20 mg total) by mouth 2 (two) times daily with a meal. 10 tablet Eustace MooreNelson, Geanna Divirgilio Sue, MD     Controlled Substance Prescriptions [place] Controlled Substance Registry consulted? Not Applicable   Eustace MooreNelson, Perlie Stene Sue, MD 08/06/19 [phone]

## 2019-08-07 LAB — NOVEL CORONAVIRUS, NAA (HOSP ORDER, SEND-OUT TO REF LAB; TAT 18-24 HRS): SARS-CoV-2, NAA: NOT DETECTED

## 2019-08-08 ENCOUNTER — Other Ambulatory Visit: Payer: Self-pay

## 2019-08-08 ENCOUNTER — Encounter (HOSPITAL_COMMUNITY): Payer: Self-pay

## 2019-08-08 ENCOUNTER — Encounter (HOSPITAL_COMMUNITY): Payer: Self-pay | Admitting: Emergency Medicine

## 2019-08-08 ENCOUNTER — Ambulatory Visit (HOSPITAL_COMMUNITY)
Admission: EM | Admit: 2019-08-08 | Discharge: 2019-08-08 | Disposition: A | Discharge status: Home / Self Care | Payer: Self-pay | Attending: Urgent Care | Admitting: Urgent Care

## 2019-08-08 ENCOUNTER — Ambulatory Visit (INDEPENDENT_AMBULATORY_CARE_PROVIDER_SITE_OTHER): Payer: Self-pay

## 2019-08-08 DIAGNOSIS — R0602 Shortness of breath: Secondary | ICD-10-CM

## 2019-08-08 DIAGNOSIS — J3089 Other allergic rhinitis: Secondary | ICD-10-CM

## 2019-08-08 DIAGNOSIS — R05 Cough: Secondary | ICD-10-CM

## 2019-08-08 DIAGNOSIS — R059 Cough, unspecified: Secondary | ICD-10-CM

## 2019-08-08 DIAGNOSIS — J209 Acute bronchitis, unspecified: Secondary | ICD-10-CM

## 2019-08-08 DIAGNOSIS — Z8709 Personal history of other diseases of the respiratory system: Secondary | ICD-10-CM

## 2019-08-08 MED ORDER — FAMOTIDINE 20 MG PO TABS: 20.0000 mg | ORAL_TABLET | Freq: Two times a day (BID) | ORAL | 0 refills | Status: DC

## 2019-08-08 MED ORDER — AZITHROMYCIN 250 MG PO TABS: 250.0000 mg | ORAL_TABLET | Freq: Every day | ORAL | 0 refills | Status: DC

## 2019-08-08 MED ORDER — BENZONATATE 100 MG PO CAPS: 100.0000 mg | ORAL_CAPSULE | Freq: Three times a day (TID) | ORAL | 0 refills | Status: DC | PRN

## 2019-08-08 MED ORDER — ALBUTEROL SULFATE HFA 108 (90 BASE) MCG/ACT IN AERS: 1.0000 | INHALATION_SPRAY | Freq: Four times a day (QID) | RESPIRATORY_TRACT | 0 refills | Status: DC | PRN

## 2019-08-08 NOTE — ED Provider Notes (Signed)
MRN: [id number] DOB: [birth year]  Subjective:   Russell [name] is a 28 y.o. male presenting for 1 week history of persistent shortness of breath, difficulty taking in deep breath worse at night.  Patient has been using albuterol inhaler with minimal to some relief.  This test results were negative for COVID-19, resulted on 08/05/2019.  He was advised to return to clinic and to a recheck for chest x-ray given his shortness of breath if his COVID test was negative.  Today, he reports occasional dry cough and mild occasional chest pain.  Past medical history is positive for childhood asthma.  Also has history of bronchospasms and allergies.   No current facility-administered medications for this encounter.   Current Outpatient Medications:  .  albuterol (VENTOLIN HFA) 108 (90 Base) MCG/ACT inhaler, Inhale 1-2 puffs into the lungs every 6 (six) hours as needed for wheezing or shortness of breath., Disp: 6.7 g, Rfl: 0 .  predniSONE (DELTASONE) 20 MG tablet, Take 1 tablet (20 mg total) by mouth 2 (two) times daily with a meal., Disp: 10 tablet, Rfl: 0 .  EPINEPHrine (EPIPEN) 0.3 mg/0.3 mL DEVI, Inject 0.3 mLs (0.3 mg total) into the muscle once., Disp: 1 Device, Rfl: 0    Allergies  Allergen Reactions  . Penicillins     Unknown    Past Medical History:  Diagnosis Date  . Allergy   . Anxiety   . Asthma   . Obesity      Past Surgical History:  Procedure Laterality Date  . TONSILLECTOMY    . TYMPANOSTOMY TUBE PLACEMENT      Review of Systems  Constitutional: Negative for fever and malaise/fatigue.  HENT: Negative for congestion, ear pain, sinus pain and sore throat.        Has had intermittent sour brash taste.  Eyes: Negative for blurred vision, double vision, discharge and redness.  Respiratory: Positive for cough and shortness of breath. Negative for hemoptysis and wheezing.   Cardiovascular: Positive for chest pain.  Gastrointestinal: Negative for abdominal pain, diarrhea, nausea  and vomiting.  Genitourinary: Negative for dysuria, flank pain and hematuria.  Musculoskeletal: Negative for myalgias.  Skin: Negative for rash.  Neurological: Negative for dizziness, weakness and headaches.  Psychiatric/Behavioral: Negative for depression and substance abuse.    Objective:   Vitals: BP 127/89 (BP Location: Right Arm) Comment (BP Location): large cuff  Pulse 88   Temp 98.7 F (37.1 C) (Oral)   Resp (!) 22   SpO2 98%   Physical Exam Constitutional:      General: He is not in acute distress.    Appearance: Normal appearance. He is well-developed. He is not ill-appearing, toxic-appearing or diaphoretic.  HENT:     Head: Normocephalic and atraumatic.     Right Ear: External ear normal.     Left Ear: External ear normal.     Nose: Nose normal.     Mouth/Throat:     Mouth: Mucous membranes are moist.     Pharynx: Oropharynx is clear.  Eyes:     General: No scleral icterus.    Extraocular Movements: Extraocular movements intact.     Pupils: Pupils are equal, round, and reactive to light.  Cardiovascular:     Rate and Rhythm: Normal rate and regular rhythm.     Heart sounds: Normal heart sounds. No murmur. No friction rub. No gallop.   Pulmonary:     Effort: Pulmonary effort is normal. No respiratory distress.     Breath sounds: Normal  breath sounds. No stridor. No wheezing, rhonchi or rales.  Neurological:     Mental Status: He is alert and oriented to person, place, and time.  Psychiatric:        Mood and Affect: Mood normal.        Behavior: Behavior normal.        Thought Content: Thought content normal.    Dg Chest 2 View  Result Date: 08/08/2019 CLINICAL DATA:  Cough and shortness of breath. Unusual sensation in the chest. EXAM: CHEST - 2 VIEW COMPARISON:  Chest CT dated 03/10/2015 and chest radiographs dated 05/31/2010. FINDINGS: Interval borderline enlarged cardiac silhouette, magnified by a poor inspiration. Interval mild to moderate diffuse  peribronchial thickening. No airspace consolidation or pleural fluid. Unremarkable bones. IMPRESSION: Mild to moderate bronchitic changes. Electronically Signed   By: Claudie Revering M.D.   On: 08/08/2019 17:07    Assessment and Plan :   1. Acute bronchitis, unspecified organism   2. Shortness of breath   3. Cough   4. History of asthma   5. Allergic rhinitis due to other allergic trigger, unspecified seasonality     Patient is to maintain prednisone course.  Will add azithromycin.  Refilled his albuterol inhaler, patient specifically requested Ventolin.  Use supportive care including antihistamine, Tessalon Perles.  Recommended patient use Pepcid to address sour brash/reflux. Counseled patient on potential for adverse effects with medications prescribed/recommended today, ER and return-to-clinic precautions discussed, patient verbalized understanding.    Jaynee Eagles, PA-C 08/08/19 1723

## 2019-08-08 NOTE — ED Triage Notes (Signed)
Patient says he was told if covid test negative, to return for a chest xray to further evaluate breathing issue.    Patient was seen on 08/05/2019  Reports last night was the worst night due to difficulty breathing.    covid test reported negative

## 2019-08-15 ENCOUNTER — Encounter (HOSPITAL_COMMUNITY): Payer: Self-pay | Admitting: Emergency Medicine

## 2019-08-15 ENCOUNTER — Ambulatory Visit (HOSPITAL_COMMUNITY)
Admission: EM | Admit: 2019-08-15 | Discharge: 2019-08-15 | Disposition: A | Discharge status: Home / Self Care | Payer: Self-pay | Attending: Internal Medicine | Admitting: Internal Medicine

## 2019-08-15 ENCOUNTER — Other Ambulatory Visit: Payer: Self-pay

## 2019-08-15 DIAGNOSIS — J4541 Moderate persistent asthma with (acute) exacerbation: Secondary | ICD-10-CM

## 2019-08-15 MED ORDER — PREDNISONE 10 MG PO TABS: ORAL_TABLET | ORAL | 0 refills | Status: DC

## 2019-08-15 MED ORDER — FLUTICASONE-SALMETEROL 250-50 MCG/DOSE IN AEPB: 1.0000 | INHALATION_SPRAY | Freq: Two times a day (BID) | RESPIRATORY_TRACT | 0 refills | Status: DC

## 2019-08-15 NOTE — ED Provider Notes (Signed)
[place]    CSN: [id number] Arrival date & time: 08/15/19  1737      History   Chief Complaint Chief Complaint  Patient presents with  . Shortness of Breath    HPI FARRELL PANTALEO is a 28 y.o. male  with a history of asthma is being seen here on identification for persistent shortness of breath.  Patient went to work today after completing a course of antibiotics and steroids.  At work his shortness of breath worsened he denied any wheezing or cough.  He had to stop many times to catch his breath.  He came home before the workday was still.  Patient is currently on albuterol inhaler.   He denies any chest pain or chest pressure.  No dizziness, near syncope or syncopal episodes.  Chest x-ray from previous visit showed mild to moderate bronchitis.  Patient denies any fever or chills.  He has a history of environmental allergens.  HPI  Past Medical History:  Diagnosis Date  . Allergy   . Anxiety   . Asthma   . Obesity     Patient Active Problem List   Diagnosis Date Noted  . GAD (generalized anxiety disorder) 12/10/2015  . Tinnitus 04/09/2015  . Bronchospasm 03/02/2015  . Allergy 03/02/2015  . Complicated grief 29/79/8921  . BMI 36.0-36.9,adult 11/02/2014    Past Surgical History:  Procedure Laterality Date  . TONSILLECTOMY    . TYMPANOSTOMY TUBE PLACEMENT         Home Medications    Prior to Admission medications   Medication Sig Start Date End Date Taking? Authorizing Provider  albuterol (VENTOLIN HFA) 108 (90 Base) MCG/ACT inhaler Inhale 1-2 puffs into the lungs every 6 (six) hours as needed for wheezing or shortness of breath. 08/08/19   Jaynee Eagles, PA-C  azithromycin (ZITHROMAX) 250 MG tablet Take 1 tablet (250 mg total) by mouth daily. Take first 2 tablets together, then 1 every day until finished. 08/08/19   Jaynee Eagles, PA-C  benzonatate (TESSALON) 100 MG capsule Take 1-2 capsules (100-200 mg total) by mouth 3 (three) times daily as needed.  08/08/19   Jaynee Eagles, PA-C  EPINEPHrine (EPIPEN) 0.3 mg/0.3 mL DEVI Inject 0.3 mLs (0.3 mg total) into the muscle once. 07/11/12   Collene Leyden, PA-C  famotidine (PEPCID) 20 MG tablet Take 1 tablet (20 mg total) by mouth 2 (two) times daily. 08/08/19   Jaynee Eagles, PA-C  Fluticasone-Salmeterol (ADVAIR DISKUS) 250-50 MCG/DOSE AEPB Inhale 1 puff into the lungs 2 (two) times daily. 08/15/19   Jasaun Carn, Myrene Galas, MD  predniSONE (DELTASONE) 10 MG tablet Please take 4 tablets orally daily for 3 days then 3 tablets orally daily for 3 days then 2 tablets orally daily for 3 days then 1 tablet orally daily for 3 days then stop. 08/15/19   LampteyMyrene Galas, MD    Family History Family History  Problem Relation Age of Onset  . Hypertension Mother   . Mental illness Mother   . Stroke Father   . Heart disease Maternal Grandfather   . Mental retardation Maternal Uncle     Social History Social History   Tobacco Use  . Smoking status: Never Smoker  . Smokeless tobacco: Current User    Types: Chew  Substance Use Topics  . Alcohol use: Yes    Comment: occasionally  . Drug use: No     Allergies   Penicillins   Review of Systems Review of Systems  Constitutional: Negative.  HENT: Positive for congestion. Negative for postnasal drip, rhinorrhea and sore throat.   Respiratory: Positive for cough, chest tightness, shortness of breath and wheezing.   Cardiovascular: Negative for chest pain and palpitations.  Gastrointestinal: Negative.   Genitourinary: Negative.   Musculoskeletal: Negative.   Neurological: Negative for dizziness, weakness, light-headedness and headaches.     Physical Exam Triage Vital Signs ED Triage Vitals [08/15/19 1802]  Enc Vitals Group     BP (!) 146/82     Pulse Rate 100     Resp 20     Temp 98.6 F (37 C)     Temp Source Oral     SpO2 98 %     Weight      Height      Head Circumference      Peak Flow      Pain Score 0     Pain Loc      Pain Edu?       Excl. in GC?    No data found.  Updated Vital Signs BP (!) 146/82 (BP Location: Right Arm)   Pulse 100   Temp 98.6 F (37 C) (Oral)   Resp 20   SpO2 98%   Visual Acuity Right Eye Distance:   Left Eye Distance:   Bilateral Distance:    Right Eye Near:   Left Eye Near:    Bilateral Near:     Physical Exam Vitals signs and nursing note reviewed.  Constitutional:      Appearance: He is not ill-appearing or toxic-appearing.  Cardiovascular:     Rate and Rhythm: Normal rate and regular rhythm.  Pulmonary:     Breath sounds: No decreased breath sounds, wheezing, rhonchi or rales.  Chest:     Chest wall: No mass or tenderness. There is no dullness to percussion.  Abdominal:     Palpations: Abdomen is soft.     Tenderness: There is no abdominal tenderness. There is no guarding.  Skin:    General: Skin is warm.     Capillary Refill: Capillary refill takes less than 2 seconds.     Coloration: Skin is not cyanotic.     Findings: No erythema or rash.  Neurological:     General: No focal deficit present.     Mental Status: He is alert.      UC Treatments / Results  Labs (all labs ordered are listed, but only abnormal results are displayed) Labs Reviewed - No data to display  EKG   Radiology No results found.  Procedures Procedures (including critical care time)  Medications Ordered in UC Medications - No data to display  Initial Impression / Assessment and Plan / UC Course  I have reviewed the triage vital signs and the nursing notes.  Pertinent labs & imaging results that were available during my care of the patient were reviewed by me and considered in my medical decision making (see chart for details).     1.  Moderate persistent asthma with exacerbation: Continue albuterol as a rescue inhaler Add fluticasone/salmeterol HandiHaler use twice daily Prednisone tapering dose starting with 40 mg to taper over 2 weeks If patient's symptoms continues he may  need pulmonology evaluation and a possible pulmonary function test to assess the severity of asthma. If patient's symptoms worsens he is advised to return to urgent care to be reevaluated.  I anticipate patient will have some financial changes with fairly long-acting inhaled steroid and beta 1 agonist. Final Clinical Impressions(s) / UC  Diagnoses   Final diagnoses:  Moderate persistent asthma with exacerbation   Discharge Instructions   None    ED Prescriptions    Medication Sig Dispense Auth. Provider   predniSONE (DELTASONE) 10 MG tablet Please take 4 tablets orally daily for 3 days then 3 tablets orally daily for 3 days then 2 tablets orally daily for 3 days then 1 tablet orally daily for 3 days then stop. 30 tablet Salote Weidmann, Britta Mccreedy, MD   Fluticasone-Salmeterol (ADVAIR DISKUS) 250-50 MCG/DOSE AEPB Inhale 1 puff into the lungs 2 (two) times daily. 60 each Keiran Sias, Britta Mccreedy, MD     Controlled Substance Prescriptions [place] Controlled Substance Registry consulted? No   Merrilee Jansky, MD 08/15/19 2013

## 2019-08-15 NOTE — ED Triage Notes (Signed)
Pt here for increased SOB; pt sts diagnosed with bronchitis on 8/20 and tried to return to work today; pt sts SOB but denies cough or fever

## 2020-03-20 ENCOUNTER — Encounter (HOSPITAL_COMMUNITY): Payer: Self-pay

## 2020-03-20 ENCOUNTER — Other Ambulatory Visit: Payer: Self-pay

## 2020-03-20 ENCOUNTER — Ambulatory Visit (HOSPITAL_COMMUNITY)
Admission: EM | Admit: 2020-03-20 | Discharge: 2020-03-20 | Disposition: A | Discharge status: Home / Self Care | Payer: Self-pay | Attending: Family Medicine | Admitting: Family Medicine

## 2020-03-20 DIAGNOSIS — F411 Generalized anxiety disorder: Secondary | ICD-10-CM

## 2020-03-20 DIAGNOSIS — R079 Chest pain, unspecified: Secondary | ICD-10-CM

## 2020-03-20 DIAGNOSIS — J4531 Mild persistent asthma with (acute) exacerbation: Secondary | ICD-10-CM

## 2020-03-20 MED ORDER — ALBUTEROL SULFATE HFA 108 (90 BASE) MCG/ACT IN AERS: 1.0000 | INHALATION_SPRAY | Freq: Four times a day (QID) | RESPIRATORY_TRACT | 0 refills | Status: DC | PRN

## 2020-03-20 MED ORDER — PREDNISONE 10 MG (21) PO TBPK: ORAL_TABLET | Freq: Every day | ORAL | 0 refills | Status: AC

## 2020-03-20 MED ORDER — MONTELUKAST SODIUM 10 MG PO TABS: 10.0000 mg | ORAL_TABLET | Freq: Every day | ORAL | 2 refills | Status: DC

## 2020-03-20 MED ORDER — METHYLPREDNISOLONE SODIUM SUCC 125 MG IJ SOLR
60.0000 mg | Freq: Once | INTRAMUSCULAR | Status: AC
  Administered 2020-03-20: 60 mg via INTRAMUSCULAR

## 2020-03-20 MED ORDER — MOMETASONE FURO-FORMOTEROL FUM 200-5 MCG/ACT IN AERO: 2.0000 | INHALATION_SPRAY | Freq: Two times a day (BID) | RESPIRATORY_TRACT | 0 refills | Status: DC

## 2020-03-20 MED ORDER — METHYLPREDNISOLONE SODIUM SUCC 125 MG IJ SOLR
INTRAMUSCULAR | Status: AC
  Filled 2020-03-20: qty 2

## 2020-03-20 NOTE — ED Triage Notes (Signed)
C/o chest wall pain for about 1 wk. Worse when sitting up straight. Denies pain with deep inspiration. Hx asthma.

## 2020-03-20 NOTE — ED Provider Notes (Signed)
[facility]    CSN: [id number] Arrival date & time: 03/20/20  4098      History   Chief Complaint Chief Complaint  Patient presents with  . Chest Wall Pain  . Asthma    HPI Russell [name] is a 29 y.o. male.   Patient reports that he has been having increased chest pain for the last week.  Reports that the pain is worse whenever he sits up and takes a deep breath.  Also reports that the pain has woken him out of his sleep, and that this seems different than the chest pain he experiences when he is having asthma exacerbation.  He says that this chest pain is on his left side and the middle of his chest.  Reports that he cannot move in a certain way or really do anything to make the pain go away, he just has to wait it out.  Patient reports that he is also using his rescue inhaler about once every hour or 2.  Denies fever, headache, nausea, vomiting, diarrhea, rash, other symptoms.  ROS Per HPI  The history is provided by the patient.    Past Medical History:  Diagnosis Date  . Allergy   . Anxiety   . Asthma   . Obesity     Patient Active Problem List   Diagnosis Date Noted  . GAD (generalized anxiety disorder) 12/10/2015  . Tinnitus 04/09/2015  . Bronchospasm 03/02/2015  . Allergy 03/02/2015  . 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 BMI 36.0-36.9,adult 11/02/2014    Past Surgical History:  Procedure Laterality Date  . TONSILLECTOMY    . TYMPANOSTOMY TUBE PLACEMENT         Home Medications    Prior to Admission medications   Medication Sig Start Date End Date Taking? Authorizing Provider  albuterol (VENTOLIN HFA) 108 (90 Base) MCG/ACT inhaler Inhale 1-2 puffs into the lungs every 6 (six) hours as needed for wheezing or shortness of breath. 03/20/20   Moshe Cipro, NP  azithromycin (ZITHROMAX) 250 MG tablet Take 1 tablet (250 mg total) by mouth daily. Take first 2 tablets together, then 1 every day until finished. 08/08/19   Wallis Bamberg, PA-C    benzonatate (TESSALON) 100 MG capsule Take 1-2 capsules (100-200 mg total) by mouth 3 (three) times daily as needed. 08/08/19   Wallis Bamberg, PA-C  EPINEPHrine (EPIPEN) 0.3 mg/0.3 mL DEVI Inject 0.3 mLs (0.3 mg total) into the muscle once. 07/11/12   Nelva Nay, PA-C  famotidine (PEPCID) 20 MG tablet Take 1 tablet (20 mg total) by mouth 2 (two) times daily. 08/08/19   Wallis Bamberg, PA-C  Fluticasone-Salmeterol (ADVAIR DISKUS) 250-50 MCG/DOSE AEPB Inhale 1 puff into the lungs 2 (two) times daily. 08/15/19   LampteyBritta Mccreedy, MD  mometasone-formoterol (DULERA) 200-5 MCG/ACT AERO Inhale 2 puffs into the lungs 2 (two) times daily. 03/20/20   Moshe Cipro, NP  montelukast (SINGULAIR) 10 MG tablet Take 1 tablet (10 mg total) by mouth at bedtime. 03/20/20   Moshe Cipro, NP  predniSONE (STERAPRED UNI-PAK 21 TAB) 10 MG (21) TBPK tablet Take by mouth daily for 6 days. Take 6 tablets on day 1, 5 tablets on day 2, 4 tablets on day 3, 3 tablets on day 4, 2 tablets on day 5, 1 tablet on day 6 03/20/20 03/26/20  Moshe Cipro, NP    Family History Family History  Problem Relation Age of Onset  . Hypertension Mother   . Mental illness Mother   .  Stroke Father   . Heart disease Maternal Grandfather   . Mental retardation Maternal Uncle     Social History Social History   Tobacco Use  . Smoking status: Never Smoker  . Smokeless tobacco: Current User    Types: Chew  Substance Use Topics  . Alcohol use: Yes    Comment: occasionally  . Drug use: No     Allergies   Penicillins   Review of Systems Review of Systems   Physical Exam Triage Vital Signs ED Triage Vitals  Enc Vitals Group     BP      Pulse      Resp      Temp      Temp src      SpO2      Weight      Height      Head Circumference      Peak Flow      Pain Score      Pain Loc      Pain Edu?      Excl. in GC?    No data found.  Updated Vital Signs BP (!) 143/92 (BP Location: Right Arm)   Pulse (!) 102    Temp 98.8 F (37.1 C) (Oral)   Resp 20   SpO2 97%   Visual Acuity Right Eye Distance:   Left Eye Distance:   Bilateral Distance:    Right Eye Near:   Left Eye Near:    Bilateral Near:     Physical Exam Vitals and nursing note reviewed.  Constitutional:      General: He is not in acute distress.    Appearance: Normal appearance. He is well-developed. He is obese. He is not ill-appearing.  HENT:     Head: Normocephalic and atraumatic.     Right Ear: Tympanic membrane normal.     Left Ear: Tympanic membrane normal.     Nose: Nose normal.     Mouth/Throat:     Mouth: Mucous membranes are moist.     Pharynx: Oropharynx is clear.  Eyes:     Conjunctiva/sclera: Conjunctivae normal.  Cardiovascular:     Rate and Rhythm: Normal rate and regular rhythm.     Heart sounds: Normal heart sounds. No murmur.  Pulmonary:     Effort: Pulmonary effort is normal. No respiratory distress.     Breath sounds: No stridor. Wheezing (Diffuse) present. No rhonchi or rales.  Chest:     Chest wall: No tenderness.  Abdominal:     General: Bowel sounds are normal.     Palpations: Abdomen is soft.     Tenderness: There is no abdominal tenderness.  Musculoskeletal:        General: Tenderness (Chest wall) present. Normal range of motion.     Cervical back: Normal range of motion and neck supple.  Skin:    General: Skin is warm and dry.     Capillary Refill: Capillary refill takes less than 2 seconds.  Neurological:     General: No focal deficit present.     Mental Status: He is alert and oriented to person, place, and time.  Psychiatric:        Mood and Affect: Mood normal.        Behavior: Behavior normal.        Thought Content: Thought content normal.      UC Treatments / Results  Labs (all labs ordered are listed, but only abnormal results are displayed) Labs Reviewed - No  data to display  EKG   Radiology No results found.  Procedures Procedures (including critical care  time)  Medications Ordered in UC Medications  methylPREDNISolone sodium succinate (SOLU-MEDROL) 125 mg/2 mL injection 60 mg (60 mg Intramuscular Given 03/20/20 0852)    Initial Impression / Assessment and Plan / UC Course  I have reviewed the triage vital signs and the nursing notes.  Pertinent labs & imaging results that were available during my care of the patient were reviewed by me and considered in my medical decision making (see chart for details).     Anxiety: Patient has recently lost a family member, and reports having difficulty coping with this.  Instructed that he needs to follow-up with primary care for possible treatment and referral to a counselor.  Mild persistent asthma with exacerbation/chest wall pain: Chest wall pain likely due to increased work of breathing as a result of asthma exacerbation.  Patient has not been treating this at home with any long-acting medications.  Has been using his inhaler almost every hour.  Discussed with patient that this is not safe.  Refill patient's rescue inhaler, and discussed with patient that this is to be used 2 puffs no more than every 4 hours.  Instructed that if patient is needing his rescue inhaler more often than this, he needs to be evaluated in the ER or urgent care.  He could also choose to be evaluated at his primary care provider's practice.  Refilled Dulera and instructed patient that he may use his Dulera 2 puffs twice daily.  Prednisone taper sent to patient's preferred pharmacy, discussed with patient in office how to take this medication.  Also prescribed Singulair to begin today and to start taking daily to help control asthma.  Discussed with patient that he may want to start a regimen of Zyrtec or Allegra, or other over-the-counter allergy medication.  Recommended that he take this daily to keep allergy and asthma symptoms at [place].  Received 60 mg Solu-Medrol IM in office today.  Instructed patient that if he is experiencing  trouble swallowing, trouble breathing, other concerning symptoms that he needs to be evaluated in the ER.  Patient verbalizes understanding and agrees to treatment plan. Final Clinical Impressions(s) / UC Diagnoses   Final diagnoses:  Mild persistent asthma with exacerbation  Chest pain, unspecified type  Anxiety state     Discharge Instructions     You are having an asthma exacerbation.  I do not want you to continue using your albuterol inhaler every hour or 2, if you are having this much trouble breathing, I would have the report to the emergency department.  Have sent in a prescription for Singulair for you to take daily to help with your asthma, have also sent in [facility] inhaler for you to use 2 puffs twice a day.  Have also refilled your albuterol inhaler for use 2 puffs every 4-6 hours as needed for wheezing.  You received a steroid shot in the office today.  I have sent in a steroid taper for you to begin tomorrow.  I would like you to use an over-the-counter allergy medication like Zyrtec or Allegra daily.  Follow-up the ER if you are having worsening symptoms such as trouble swallowing, trouble breathing, other concerning symptoms.    ED Prescriptions    Medication Sig Dispense Auth. Provider   albuterol (VENTOLIN HFA) 108 (90 Base) MCG/ACT inhaler Inhale 1-2 puffs into the lungs every 6 (six) hours as needed for wheezing or shortness  of breath. 18 g Moshe Cipro, NP   mometasone-formoterol (DULERA) 200-5 MCG/ACT AERO Inhale 2 puffs into the lungs 2 (two) times daily. 8.8 g Moshe Cipro, NP   predniSONE (STERAPRED UNI-PAK 21 TAB) 10 MG (21) TBPK tablet Take by mouth daily for 6 days. Take 6 tablets on day 1, 5 tablets on day 2, 4 tablets on day 3, 3 tablets on day 4, 2 tablets on day 5, 1 tablet on day 6 21 tablet Moshe Cipro, NP   montelukast (SINGULAIR) 10 MG tablet Take 1 tablet (10 mg total) by mouth at bedtime. 30 tablet Moshe Cipro, NP      PDMP not reviewed this encounter.   Moshe Cipro, NP 03/21/20 2103

## 2020-03-20 NOTE — Discharge Instructions (Addendum)
You are having an asthma exacerbation.  I do not want you to continue using your albuterol inhaler every hour or 2, if you are having this much trouble breathing, I would have the report to the emergency department.  Have sent in a prescription for Singulair for you to take daily to help with your asthma, have also sent in [facility] inhaler for you to use 2 puffs twice a day.  Have also refilled your albuterol inhaler for use 2 puffs every 4-6 hours as needed for wheezing.  You received a steroid shot in the office today.  I have sent in a steroid taper for you to begin tomorrow.  I would like you to use an over-the-counter allergy medication like Zyrtec or Allegra daily.  Follow-up the ER if you are having worsening symptoms such as trouble swallowing, trouble breathing, other concerning symptoms.

## 2020-04-14 ENCOUNTER — Emergency Department (HOSPITAL_COMMUNITY)
Admission: EM | Admit: 2020-04-14 | Discharge: 2020-04-14 | Disposition: A | Discharge status: Home / Self Care | Payer: Self-pay | Attending: Emergency Medicine | Admitting: Emergency Medicine

## 2020-04-14 ENCOUNTER — Encounter (HOSPITAL_COMMUNITY): Payer: Self-pay | Admitting: *Deleted

## 2020-04-14 ENCOUNTER — Other Ambulatory Visit: Payer: Self-pay

## 2020-04-14 DIAGNOSIS — J45901 Unspecified asthma with (acute) exacerbation: Secondary | ICD-10-CM | POA: Insufficient documentation

## 2020-04-14 DIAGNOSIS — Z5321 Procedure and treatment not carried out due to patient leaving prior to being seen by health care provider: Secondary | ICD-10-CM | POA: Insufficient documentation

## 2020-04-14 NOTE — ED Triage Notes (Signed)
Pt reports recent increase in asthma symptoms, no relief with his inhaler at home. No resp distress is noted, spo2 99% at triage.

## 2020-04-26 ENCOUNTER — Encounter (HOSPITAL_COMMUNITY): Payer: Self-pay

## 2020-04-26 ENCOUNTER — Other Ambulatory Visit: Payer: Self-pay

## 2020-04-26 ENCOUNTER — Emergency Department (HOSPITAL_COMMUNITY)
Admission: EM | Admit: 2020-04-26 | Discharge: 2020-04-26 | Disposition: A | Discharge status: Home / Self Care | Payer: Self-pay | Attending: Emergency Medicine | Admitting: Emergency Medicine

## 2020-04-26 ENCOUNTER — Emergency Department (HOSPITAL_COMMUNITY): Payer: Self-pay

## 2020-04-26 DIAGNOSIS — J4521 Mild intermittent asthma with (acute) exacerbation: Secondary | ICD-10-CM | POA: Insufficient documentation

## 2020-04-26 DIAGNOSIS — Z79899 Other long term (current) drug therapy: Secondary | ICD-10-CM | POA: Insufficient documentation

## 2020-04-26 DIAGNOSIS — F17220 Nicotine dependence, chewing tobacco, uncomplicated: Secondary | ICD-10-CM | POA: Insufficient documentation

## 2020-04-26 MED ORDER — PREDNISONE 20 MG PO TABS: 60.0000 mg | ORAL_TABLET | Freq: Every day | ORAL | 0 refills | Status: DC

## 2020-04-26 MED ORDER — ALBUTEROL SULFATE HFA 108 (90 BASE) MCG/ACT IN AERS
4.0000 | INHALATION_SPRAY | Freq: Once | RESPIRATORY_TRACT | Status: AC
  Administered 2020-04-26: 4 via RESPIRATORY_TRACT
  Filled 2020-04-26: qty 6.7

## 2020-04-26 NOTE — Discharge Instructions (Signed)
You are seen in the emergency department for increased shortness of breath.  You had an EKG and a chest x-ray that did not show any serious findings.  This is likely a continued flareup of your asthma probably due to pollen in the air.  Please continue to use an antihistamine and can continue albuterol inhaler 2 puffs every 4 hours as needed.  We are also prescribing you prednisone if your symptoms are not improving.  Please return to the emergency department if any worsening or concerning symptoms

## 2020-04-26 NOTE — ED Triage Notes (Signed)
Pt arrives POV for eval of SOB, reports he tried albuterol inhaler at home w/ minimal improvement. Satting well on RA in triage, no obv resp. Distress.

## 2020-04-26 NOTE — ED Provider Notes (Signed)
[facility] EMERGENCY DEPARTMENT Provider Note   CSN: [id number] Arrival date & time: 04/26/20  1946     History Chief Complaint  Patient presents with  . Shortness of Breath    Russell [name] is a 29 y.o. male.  He has longstanding history of asthma.  He is complaining of shortness of breath that started around 6 PM tonight.  Used his rescue inhaler without any improvement.  He said he used to be on a steroid inhaler but he lost his insurance and it is too expensive.  Has had a little bit of a cough no fever.  A little bit of chest tightness.  The history is provided by the patient.  Shortness of Breath Severity:  Moderate Onset quality:  Gradual Timing:  Sporadic Progression:  Unchanged Chronicity:  Recurrent Context: pollens   Relieved by:  Nothing Worsened by:  Activity and coughing Ineffective treatments:  Inhaler Associated symptoms: cough   Associated symptoms: no abdominal pain, no chest pain, no fever, no headaches, no hemoptysis, no neck pain, no rash, no sore throat and no sputum production   Risk factors: tobacco use        Past Medical History:  Diagnosis Date  . Allergy   . Anxiety   . Asthma   . Obesity     Patient Active Problem List   Diagnosis Date Noted  . GAD (generalized anxiety disorder) 12/10/2015  . Tinnitus 04/09/2015  . Bronchospasm 03/02/2015  . Allergy 03/02/2015  . 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 BMI 36.0-36.9,adult 11/02/2014    Past Surgical History:  Procedure Laterality Date  . TONSILLECTOMY    . TYMPANOSTOMY TUBE PLACEMENT         Family History  Problem Relation Age of Onset  . Hypertension Mother   . Mental illness Mother   . Stroke Father   . Heart disease Maternal Grandfather   . Mental retardation Maternal Uncle     Social History   Tobacco Use  . Smoking status: Never Smoker  . Smokeless tobacco: Current User    Types: Chew  Substance Use Topics  . Alcohol use: Yes    Comment:  occasionally  . Drug use: No    Home Medications Prior to Admission medications   Medication Sig Start Date End Date Taking? Authorizing Provider  albuterol (VENTOLIN HFA) 108 (90 Base) MCG/ACT inhaler Inhale 1-2 puffs into the lungs every 6 (six) hours as needed for wheezing or shortness of breath. 03/20/20   Moshe Cipro, NP  azithromycin (ZITHROMAX) 250 MG tablet Take 1 tablet (250 mg total) by mouth daily. Take first 2 tablets together, then 1 every day until finished. 08/08/19   Wallis Bamberg, PA-C  benzonatate (TESSALON) 100 MG capsule Take 1-2 capsules (100-200 mg total) by mouth 3 (three) times daily as needed. 08/08/19   Wallis Bamberg, PA-C  EPINEPHrine (EPIPEN) 0.3 mg/0.3 mL DEVI Inject 0.3 mLs (0.3 mg total) into the muscle once. 07/11/12   Nelva Nay, PA-C  famotidine (PEPCID) 20 MG tablet Take 1 tablet (20 mg total) by mouth 2 (two) times daily. 08/08/19   Wallis Bamberg, PA-C  Fluticasone-Salmeterol (ADVAIR DISKUS) 250-50 MCG/DOSE AEPB Inhale 1 puff into the lungs 2 (two) times daily. 08/15/19   LampteyBritta Mccreedy, MD  mometasone-formoterol (DULERA) 200-5 MCG/ACT AERO Inhale 2 puffs into the lungs 2 (two) times daily. 03/20/20   Moshe Cipro, NP  montelukast (SINGULAIR) 10 MG tablet Take 1 tablet (10 mg total) by mouth at bedtime.  03/20/20   Moshe Cipro, NP    Allergies    Penicillins  Review of Systems   Review of Systems  Constitutional: Negative for fever.  HENT: Negative for sore throat.   Eyes: Negative for visual disturbance.  Respiratory: Positive for cough and shortness of breath. Negative for hemoptysis and sputum production.   Cardiovascular: Negative for chest pain.  Gastrointestinal: Negative for abdominal pain.  Genitourinary: Negative for dysuria.  Musculoskeletal: Negative for neck pain.  Skin: Negative for rash.  Neurological: Negative for headaches.    Physical Exam Updated Vital Signs BP 130/86   Pulse 98   Temp 98 F (36.7 C)   Resp 17    Ht 6\' 5"  (1.956 m)   Wt 136.1 kg   SpO2 98%   BMI 35.57 kg/m   Physical Exam Vitals and nursing note reviewed.  Constitutional:      Appearance: He is well-developed.  HENT:     Head: Normocephalic and atraumatic.  Eyes:     Conjunctiva/sclera: Conjunctivae normal.  Cardiovascular:     Rate and Rhythm: Normal rate and regular rhythm.     Heart sounds: No murmur.  Pulmonary:     Effort: Pulmonary effort is normal. No respiratory distress.     Breath sounds: Normal breath sounds.  Chest:     Chest wall: No tenderness.  Abdominal:     Palpations: Abdomen is soft.     Tenderness: There is no abdominal tenderness.  Musculoskeletal:        General: Normal range of motion.     Cervical back: Neck supple.     Right lower leg: No tenderness.     Left lower leg: No tenderness.  Skin:    General: Skin is warm and dry.     Capillary Refill: Capillary refill takes less than 2 seconds.  Neurological:     General: No focal deficit present.     Mental Status: He is alert.     ED Results / Procedures / Treatments   Labs (all labs ordered are listed, but only abnormal results are displayed) Labs Reviewed - No data to display  EKG EKG Interpretation  Date/Time:  Sunday Apr 26 2020 20:02:47 EDT Ventricular Rate:  97 PR Interval:  158 QRS Duration: 108 QT Interval:  356 QTC Calculation: 452 R Axis:   85 Text Interpretation: Normal sinus rhythm Normal ECG No significant change since prior 4/21 Confirmed by 5/21 [phone]) on 04/26/2020 8:11:31 PM   Radiology DG Chest 2 View  Result Date: 04/26/2020 CLINICAL DATA:  Shortness of breath EXAM: CHEST - 2 VIEW COMPARISON:  08/08/2019 FINDINGS: The heart size and mediastinal contours are within normal limits. Both lungs are clear. The visualized skeletal structures are unremarkable. IMPRESSION: No active cardiopulmonary disease. Electronically Signed   By: 08/10/2019 M.D.   On: 04/26/2020 20:21    Procedures Procedures  (including critical care time)  Medications Ordered in ED Medications  albuterol (VENTOLIN HFA) 108 (90 Base) MCG/ACT inhaler 4 puff (4 puffs Inhalation Given 04/26/20 2118)    ED Course  I have reviewed the triage vital signs and the nursing notes.  Pertinent labs & imaging results that were available during my care of the patient were reviewed by me and considered in my medical decision making (see chart for details).  Clinical Course as of Apr 26 2030  09-23-1987 Apr 26, 2020  2031 X-ray interpreted by me as no acute disease.   [MB]    Clinical Course  User Index [MB] Hayden Rasmussen, MD   MDM Rules/Calculators/A&P                     29 year old male with history of asthma here with increased shortness of breath.  Differential includes asthma, pneumothorax, ACS, PE.  Patient is PERC negative.  EKG unremarkable.  Chest x-ray interpreted by me as no acute infiltrates no pneumothorax normal mediastinum.  Received a couple of puffs of albuterol here.  Unfortunately for him he was doing well with his asthma on maintenance steroid inhaler but since he lost insurance he can no longer afford them.  We will have him continue the rescue inhaler and given prescription for steroids that he can take if his symptoms continue to worsen.  Return instructions discussed Final Clinical Impression(s) / ED Diagnoses Final diagnoses:  Mild intermittent asthma with exacerbation    Rx / DC Orders ED Discharge Orders         Ordered    predniSONE (DELTASONE) 20 MG tablet  Daily     04/26/20 2133           Hayden Rasmussen, MD 04/27/20 1039

## 2020-10-08 ENCOUNTER — Other Ambulatory Visit: Payer: Self-pay

## 2020-10-08 ENCOUNTER — Encounter (HOSPITAL_COMMUNITY): Payer: Self-pay | Admitting: Emergency Medicine

## 2020-10-08 ENCOUNTER — Ambulatory Visit (HOSPITAL_COMMUNITY)
Admission: EM | Admit: 2020-10-08 | Discharge: 2020-10-08 | Disposition: A | Discharge status: Home / Self Care | Payer: Self-pay | Attending: Family Medicine | Admitting: Family Medicine

## 2020-10-08 DIAGNOSIS — J069 Acute upper respiratory infection, unspecified: Secondary | ICD-10-CM | POA: Insufficient documentation

## 2020-10-08 DIAGNOSIS — U071 COVID-19: Secondary | ICD-10-CM | POA: Insufficient documentation

## 2020-10-08 MED ORDER — IBUPROFEN 800 MG PO TABS: 800.0000 mg | ORAL_TABLET | Freq: Three times a day (TID) | ORAL | 0 refills | Status: DC

## 2020-10-08 MED ORDER — FLUTICASONE PROPIONATE 50 MCG/ACT NA SUSP: 1.0000 | Freq: Every day | NASAL | 0 refills | Status: DC

## 2020-10-08 MED ORDER — ONDANSETRON 4 MG PO TBDP: 4.0000 mg | ORAL_TABLET | Freq: Three times a day (TID) | ORAL | 0 refills | Status: DC | PRN

## 2020-10-08 MED ORDER — BENZONATATE 200 MG PO CAPS: 200.0000 mg | ORAL_CAPSULE | Freq: Three times a day (TID) | ORAL | 0 refills | Status: AC | PRN

## 2020-10-08 NOTE — ED Provider Notes (Signed)
[facility]    CSN: [id number] Arrival date & time: 10/08/20  1527      History   Chief Complaint Chief Complaint  Patient presents with   Nasal Congestion   Shortness of Breath   Cough   Fatigue    HPI Russell [name] is a 29 y.o. male history of asthma presenting today for evaluation of URI symptoms.  Patient reports last night began to feel under the weather.  Reports fatigue, cough, congestion as well as nausea.  Denies vomiting diarrhea or abdominal pain.  Denies any known Covid exposures or close sick contacts.  HPI  Past Medical History:  Diagnosis Date   Allergy    Anxiety    Asthma    Obesity     Patient Active Problem List   Diagnosis Date Noted   GAD (generalized anxiety disorder) 12/10/2015   Tinnitus 04/09/2015   Bronchospasm 03/02/2015   Allergy 03/02/2015   Complicated grief 03/02/2015   BMI 36.0-36.9,adult 11/02/2014    Past Surgical History:  Procedure Laterality Date   TONSILLECTOMY     TYMPANOSTOMY TUBE PLACEMENT         Home Medications    Prior to Admission medications   Medication Sig Start Date End Date Taking? Authorizing Provider  albuterol (VENTOLIN HFA) 108 (90 Base) MCG/ACT inhaler Inhale 1-2 puffs into the lungs every 6 (six) hours as needed for wheezing or shortness of breath. 03/20/20   Moshe Cipro, NP  benzonatate (TESSALON) 200 MG capsule Take 1 capsule (200 mg total) by mouth 3 (three) times daily as needed for up to 7 days for cough. 10/08/20 10/15/20  Leander Tout C, PA-C  EPINEPHrine (EPIPEN) 0.3 mg/0.3 mL DEVI Inject 0.3 mLs (0.3 mg total) into the muscle once. 07/11/12   Nelva Nay, PA-C  fluticasone (FLONASE) 50 MCG/ACT nasal spray Place 1-2 sprays into both nostrils daily. 10/08/20   Nevaan Bunton C, PA-C  ibuprofen (ADVIL) 800 MG tablet Take 1 tablet (800 mg total) by mouth 3 (three) times daily. 10/08/20   Sumayah Bearse C, PA-C  ondansetron (ZOFRAN ODT) 4 MG  disintegrating tablet Take 1 tablet (4 mg total) by mouth every 8 (eight) hours as needed for nausea or vomiting. 10/08/20   Ohanna Gassert C, PA-C  famotidine (PEPCID) 20 MG tablet Take 1 tablet (20 mg total) by mouth 2 (two) times daily. 08/08/19 10/08/20  Wallis Bamberg, PA-C  Fluticasone-Salmeterol (ADVAIR DISKUS) 250-50 MCG/DOSE AEPB Inhale 1 puff into the lungs 2 (two) times daily. 08/15/19 10/08/20  Merrilee Jansky, MD  mometasone-formoterol (DULERA) 200-5 MCG/ACT AERO Inhale 2 puffs into the lungs 2 (two) times daily. 03/20/20 10/08/20  Moshe Cipro, NP  montelukast (SINGULAIR) 10 MG tablet Take 1 tablet (10 mg total) by mouth at bedtime. 03/20/20 10/08/20  Moshe Cipro, NP    Family History Family History  Problem Relation Age of Onset   Hypertension Mother    Mental illness Mother    Stroke Father    Heart disease Maternal Grandfather    Mental retardation Maternal Uncle     Social History Social History   Tobacco Use   Smoking status: Never Smoker   Smokeless tobacco: Current User    Types: Engineer, drilling   Vaping Use: Every day  Substance Use Topics   Alcohol use: Yes    Comment: occasionally   Drug use: No     Allergies   Penicillins   Review of Systems Review of Systems  Constitutional: Positive  for appetite change and fatigue. Negative for activity change, chills and fever.  HENT: Positive for congestion and rhinorrhea. Negative for ear pain, sinus pressure, sore throat and trouble swallowing.   Eyes: Negative for discharge and redness.  Respiratory: Positive for cough. Negative for chest tightness and shortness of breath.   Cardiovascular: Negative for chest pain.  Gastrointestinal: Positive for nausea. Negative for abdominal pain, diarrhea and vomiting.  Musculoskeletal: Negative for myalgias.  Skin: Negative for rash.  Neurological: Negative for dizziness, light-headedness and headaches.     Physical Exam Triage Vital Signs ED  Triage Vitals  Enc Vitals Group     BP 10/08/20 1635 (!) 131/59     Pulse Rate 10/08/20 1635 (!) 101     Resp 10/08/20 1635 (!) 22     Temp 10/08/20 1635 99.5 F (37.5 C)     Temp Source 10/08/20 1635 Oral     SpO2 10/08/20 1635 98 %     Weight --      Height --      Head Circumference --      Peak Flow --      Pain Score 10/08/20 1637 8     Pain Loc --      Pain Edu? --      Excl. in GC? --    No data found.  Updated Vital Signs BP (!) 131/59 (BP Location: Right Arm)    Pulse (!) 101    Temp 99.5 F (37.5 C) (Oral)    Resp (!) 22    SpO2 98%   Visual Acuity Right Eye Distance:   Left Eye Distance:   Bilateral Distance:    Right Eye Near:   Left Eye Near:    Bilateral Near:     Physical Exam Vitals and nursing note reviewed.  Constitutional:      Appearance: He is well-developed.     Comments: No acute distress  HENT:     Head: Normocephalic and atraumatic.     Ears:     Comments: Bilateral ears without tenderness to palpation of external auricle, tragus and mastoid, EAC's without erythema or swelling, TM's with good bony landmarks and cone of light. Non erythematous.     Nose: Nose normal.     Mouth/Throat:     Comments: Oral mucosa pink and moist, no tonsillar enlargement or exudate. Posterior pharynx patent and nonerythematous, no uvula deviation or swelling. Normal phonation. Eyes:     Conjunctiva/sclera: Conjunctivae normal.  Cardiovascular:     Rate and Rhythm: Normal rate.  Pulmonary:     Effort: Pulmonary effort is normal. No respiratory distress.     Comments: Breathing comfortably at rest, CTABL, no wheezing, rales or other adventitious sounds auscultated Abdominal:     General: There is no distension.  Musculoskeletal:        General: Normal range of motion.     Cervical back: Neck supple.  Skin:    General: Skin is warm and dry.  Neurological:     Mental Status: He is alert and oriented to person, place, and time.      UC Treatments /  Results  Labs (all labs ordered are listed, but only abnormal results are displayed) Labs Reviewed  SARS CORONAVIRUS 2 (TAT 6-24 HRS)    EKG   Radiology No results found.  Procedures Procedures (including critical care time)  Medications Ordered in UC Medications - No data to display  Initial Impression / Assessment and Plan / UC Course  I have reviewed the triage vital signs and the nursing notes.  Pertinent labs & imaging results that were available during my care of the patient were reviewed by me and considered in my medical decision making (see chart for details).     Covid test pending, suspect likely viral URI and recommending symptomatic and supportive care rest and fluids.  Exam reassuring.  Discussed strict return precautions. Patient verbalized understanding and is agreeable with plan.  Final Clinical Impressions(s) / UC Diagnoses   Final diagnoses:  Viral URI with cough     Discharge Instructions     Covid test pending, monitor my chart for results Ibuprofen and Tylenol as needed for fever body aches headache Rest and drink plenty of fluids Tessalon/benzonatate every 8 hours as needed for cough, may pare with over-the-counter Mucinex Flonase to help with sinus congestion, pressure and any ear discomfort Follow-up if not improving or worsening   ED Prescriptions    Medication Sig Dispense Auth. Provider   benzonatate (TESSALON) 200 MG capsule Take 1 capsule (200 mg total) by mouth 3 (three) times daily as needed for up to 7 days for cough. 28 capsule Jakim Drapeau C, PA-C   ibuprofen (ADVIL) 800 MG tablet Take 1 tablet (800 mg total) by mouth 3 (three) times daily. 21 tablet Katrisha Segall C, PA-C   ondansetron (ZOFRAN ODT) 4 MG disintegrating tablet Take 1 tablet (4 mg total) by mouth every 8 (eight) hours as needed for nausea or vomiting. 20 tablet Zamyra Allensworth C, PA-C   fluticasone (FLONASE) 50 MCG/ACT nasal spray Place 1-2 sprays into both  nostrils daily. 16 g Stesha Neyens, [place] C, PA-C     PDMP not reviewed this encounter.   Lew Dawes, PA-C 10/08/20 1728

## 2020-10-08 NOTE — ED Triage Notes (Signed)
Patient presents to urgent care today with symptoms of cough, fatigue, nasal congestion, fever and body aches. Symptoms began on last night.

## 2020-10-08 NOTE — Discharge Instructions (Signed)
Covid test pending, monitor my chart for results Ibuprofen and Tylenol as needed for fever body aches headache Rest and drink plenty of fluids Tessalon/benzonatate every 8 hours as needed for cough, may pare with over-the-counter Mucinex Flonase to help with sinus congestion, pressure and any ear discomfort Follow-up if not improving or worsening

## 2020-10-09 ENCOUNTER — Other Ambulatory Visit: Payer: Self-pay | Admitting: Physician Assistant

## 2020-10-09 DIAGNOSIS — Z6836 Body mass index (BMI) 36.0-36.9, adult: Secondary | ICD-10-CM

## 2020-10-09 DIAGNOSIS — J45909 Unspecified asthma, uncomplicated: Secondary | ICD-10-CM

## 2020-10-09 DIAGNOSIS — U071 COVID-19: Secondary | ICD-10-CM

## 2020-10-09 LAB — SARS CORONAVIRUS 2 (TAT 6-24 HRS): SARS Coronavirus 2: POSITIVE — AB

## 2020-10-09 NOTE — Progress Notes (Signed)
I connected by phone with Russell [name] on 10/09/2020 at 10:14 AM to discuss the potential use of a new treatment for mild to moderate COVID-19 viral infection in non-hospitalized patients.  This patient is a 29 y.o. male that meets the FDA criteria for Emergency Use Authorization of COVID monoclonal antibody casirivimab/imdevimab or bamlanivimab/eteseviamb.  Has a (+) direct SARS-CoV-2 viral test result  Has mild or moderate COVID-19   Is NOT hospitalized due to COVID-19  Is within 10 days of symptom onset  Has at least one of the high risk factor(s) for progression to severe COVID-19 and/or hospitalization as defined in EUA.  Specific high risk criteria : BMI > 25, Chronic Lung Disease and Other high risk medical condition per CDC:  SVI   I have spoken and communicated the following to the patient or parent/caregiver regarding COVID monoclonal antibody treatment:  1. FDA has authorized the emergency use for the treatment of mild to moderate COVID-19 in adults and pediatric patients with positive results of direct SARS-CoV-2 viral testing who are 27 years of age and older weighing at least 40 kg, and who are at high risk for progressing to severe COVID-19 and/or hospitalization.  2. The significant known and potential risks and benefits of COVID monoclonal antibody, and the extent to which such potential risks and benefits are unknown.  3. Information on available alternative treatments and the risks and benefits of those alternatives, including clinical trials.  4. Patients treated with COVID monoclonal antibody should continue to self-isolate and use infection control measures (e.g., wear mask, isolate, social distance, avoid sharing personal items, clean and disinfect "high touch" surfaces, and frequent handwashing) according to CDC guidelines.   5. The patient or parent/caregiver has the option to accept or refuse COVID monoclonal antibody treatment.  After reviewing this  information with the patient, the patient has agreed to receive one of the available covid 19 monoclonal antibodies and will be provided an appropriate fact sheet prior to infusion. [name], [place] 10/09/2020 10:14 AM

## 2020-10-10 ENCOUNTER — Encounter (HOSPITAL_COMMUNITY): Payer: Self-pay

## 2020-10-10 ENCOUNTER — Ambulatory Visit (HOSPITAL_COMMUNITY)
Admission: RE | Admit: 2020-10-10 | Discharge: 2020-10-10 | Disposition: A | Discharge status: Home / Self Care | Payer: HRSA Program | Source: Ambulatory Visit | Attending: Pulmonary Disease | Admitting: Pulmonary Disease

## 2020-10-10 DIAGNOSIS — U071 COVID-19: Secondary | ICD-10-CM

## 2020-10-10 DIAGNOSIS — Z6836 Body mass index (BMI) 36.0-36.9, adult: Secondary | ICD-10-CM | POA: Diagnosis present

## 2020-10-10 DIAGNOSIS — J45909 Unspecified asthma, uncomplicated: Secondary | ICD-10-CM | POA: Diagnosis present

## 2020-10-10 MED ORDER — DIPHENHYDRAMINE HCL 50 MG/ML IJ SOLN: 50.0000 mg | Freq: Once | INTRAMUSCULAR | Status: DC | PRN

## 2020-10-10 MED ORDER — SODIUM CHLORIDE 0.9 % IV SOLN: Freq: Once | INTRAVENOUS | Status: AC

## 2020-10-10 MED ORDER — SODIUM CHLORIDE 0.9 % IV SOLN: INTRAVENOUS | Status: DC | PRN

## 2020-10-10 MED ORDER — EPINEPHRINE 0.3 MG/0.3ML IJ SOAJ: 0.3000 mg | Freq: Once | INTRAMUSCULAR | Status: DC | PRN

## 2020-10-10 MED ORDER — FAMOTIDINE IN NACL 20-0.9 MG/50ML-% IV SOLN: 20.0000 mg | Freq: Once | INTRAVENOUS | Status: DC | PRN

## 2020-10-10 MED ORDER — METHYLPREDNISOLONE SODIUM SUCC 125 MG IJ SOLR: 125.0000 mg | Freq: Once | INTRAMUSCULAR | Status: DC | PRN

## 2020-10-10 MED ORDER — ALBUTEROL SULFATE HFA 108 (90 BASE) MCG/ACT IN AERS: 2.0000 | INHALATION_SPRAY | Freq: Once | RESPIRATORY_TRACT | Status: DC | PRN

## 2020-10-10 NOTE — Discharge Instructions (Signed)

## 2020-10-10 NOTE — Progress Notes (Signed)
  Diagnosis: COVID-19  Physician:doolittle   Procedure: [facility] Med: bamlanivimab\etesevimab infusion - Provided patient with bamlanimivab\etesevimab fact sheet for patients, parents and caregivers prior to infusion.  Complications: No immediate complications noted.  Discharge: Discharged home   Annice Pih 10/10/2020

## 2021-01-01 ENCOUNTER — Encounter (HOSPITAL_COMMUNITY): Payer: Self-pay | Admitting: *Deleted

## 2021-01-01 ENCOUNTER — Other Ambulatory Visit: Payer: Self-pay

## 2021-01-01 ENCOUNTER — Ambulatory Visit (HOSPITAL_COMMUNITY)
Admission: EM | Admit: 2021-01-01 | Discharge: 2021-01-01 | Disposition: A | Discharge status: Home / Self Care | Payer: Self-pay | Attending: Emergency Medicine | Admitting: Emergency Medicine

## 2021-01-01 DIAGNOSIS — J4531 Mild persistent asthma with (acute) exacerbation: Secondary | ICD-10-CM

## 2021-01-01 MED ORDER — ALBUTEROL SULFATE HFA 108 (90 BASE) MCG/ACT IN AERS: 1.0000 | INHALATION_SPRAY | Freq: Four times a day (QID) | RESPIRATORY_TRACT | 0 refills | Status: DC | PRN

## 2021-01-01 MED ORDER — PREDNISONE 10 MG PO TABS: 40.0000 mg | ORAL_TABLET | Freq: Every day | ORAL | 0 refills | Status: AC

## 2021-01-01 NOTE — ED Triage Notes (Signed)
Pt reports [facility]  Due to his Asthma . Pt needs a refill on his rescue inhaler.

## 2021-01-01 NOTE — ED Provider Notes (Signed)
[facility]    CSN: [id number] Arrival date & time: 01/01/21  1505      History   Chief Complaint Chief Complaint  Patient presents with  . Asthma    HPI Russell [name] is a 30 y.o. male.   Patient presents with 3-week history of shortness of breath with a mild occasional nonproductive cough.  He states he needs a refill on his albuterol inhaler.  He reports shortness of breath is worse at night and associated with wheezing.  He denies fever, chills, chest pain, abdominal pain, or other symptoms.  His medical history includes asthma, anxiety, obesity.  The history is provided by the patient and medical records.    Past Medical History:  Diagnosis Date  . Allergy   . Anxiety   . Asthma   . Obesity     Patient Active Problem List   Diagnosis Date Noted  . GAD (generalized anxiety disorder) 12/10/2015  . Tinnitus 04/09/2015  . Bronchospasm 03/02/2015  . Allergy 03/02/2015  . 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 BMI 36.0-36.9,adult 11/02/2014    Past Surgical History:  Procedure Laterality Date  . TONSILLECTOMY    . TYMPANOSTOMY TUBE PLACEMENT         Home Medications    Prior to Admission medications   Medication Sig Start Date End Date Taking? Authorizing Provider  predniSONE (DELTASONE) 10 MG tablet Take 4 tablets (40 mg total) by mouth daily for 5 days. 01/01/21 01/06/21 Yes Mickie Bail, NP  albuterol (VENTOLIN HFA) 108 (90 Base) MCG/ACT inhaler Inhale 1-2 puffs into the lungs every 6 (six) hours as needed for wheezing or shortness of breath. 01/01/21   Mickie Bail, NP  EPINEPHrine (EPIPEN) 0.3 mg/0.3 mL DEVI Inject 0.3 mLs (0.3 mg total) into the muscle once. 07/11/12   Nelva Nay, PA-C  fluticasone (FLONASE) 50 MCG/ACT nasal spray Place 1-2 sprays into both nostrils daily. 10/08/20   Wieters, Hallie C, PA-C  ibuprofen (ADVIL) 800 MG tablet Take 1 tablet (800 mg total) by mouth 3 (three) times daily. 10/08/20   Wieters, Hallie C, PA-C   ondansetron (ZOFRAN ODT) 4 MG disintegrating tablet Take 1 tablet (4 mg total) by mouth every 8 (eight) hours as needed for nausea or vomiting. 10/08/20   Wieters, Hallie C, PA-C  famotidine (PEPCID) 20 MG tablet Take 1 tablet (20 mg total) by mouth 2 (two) times daily. 08/08/19 10/08/20  Wallis Bamberg, PA-C  Fluticasone-Salmeterol (ADVAIR DISKUS) 250-50 MCG/DOSE AEPB Inhale 1 puff into the lungs 2 (two) times daily. 08/15/19 10/08/20  Merrilee Jansky, MD  mometasone-formoterol (DULERA) 200-5 MCG/ACT AERO Inhale 2 puffs into the lungs 2 (two) times daily. 03/20/20 10/08/20  Moshe Cipro, NP  montelukast (SINGULAIR) 10 MG tablet Take 1 tablet (10 mg total) by mouth at bedtime. 03/20/20 10/08/20  Moshe Cipro, NP    Family History Family History  Problem Relation Age of Onset  . Hypertension Mother   . Mental illness Mother   . Stroke Father   . Heart disease Maternal Grandfather   . Mental retardation Maternal Uncle     Social History Social History   Tobacco Use  . Smoking status: Never Smoker  . Smokeless tobacco: Current User    Types: Chew  Vaping Use  . Vaping Use: Every day  Substance Use Topics  . Alcohol use: Yes    Comment: occasionally  . Drug use: No     Allergies   Penicillins   Review of Systems  Review of Systems  Constitutional: Negative for chills and fever.  HENT: Negative for ear pain and sore throat.   Eyes: Negative for pain and visual disturbance.  Respiratory: Positive for cough, choking and wheezing. Negative for shortness of breath.   Cardiovascular: Negative for chest pain and palpitations.  Gastrointestinal: Negative for abdominal pain and vomiting.  Genitourinary: Negative for dysuria and hematuria.  Musculoskeletal: Negative for arthralgias and back pain.  Skin: Negative for color change and rash.  Neurological: Negative for seizures and syncope.  All other systems reviewed and are negative.    Physical Exam Triage Vital  Signs ED Triage Vitals  Enc Vitals Group     BP      Pulse      Resp      Temp      Temp src      SpO2      Weight      Height      Head Circumference      Peak Flow      Pain Score      Pain Loc      Pain Edu?      Excl. in GC?    No data found.  Updated Vital Signs BP 115/76 (BP Location: Right Arm)   Pulse 84   Temp 98.3 F (36.8 C) (Oral)   Resp 18   SpO2 99%   Visual Acuity Right Eye Distance:   Left Eye Distance:   Bilateral Distance:    Right Eye Near:   Left Eye Near:    Bilateral Near:     Physical Exam Vitals and nursing note reviewed.  Constitutional:      General: He is not in acute distress.    Appearance: He is well-developed and well-nourished. He is obese. He is not ill-appearing.  HENT:     Head: Normocephalic and atraumatic.     Mouth/Throat:     Mouth: Mucous membranes are moist.  Eyes:     Conjunctiva/sclera: Conjunctivae normal.  Cardiovascular:     Rate and Rhythm: Normal rate and regular rhythm.     Heart sounds: Normal heart sounds.  Pulmonary:     Effort: Pulmonary effort is normal. No respiratory distress.     Breath sounds: Normal breath sounds. No wheezing or rhonchi.  Abdominal:     Palpations: Abdomen is soft.     Tenderness: There is no abdominal tenderness.  Musculoskeletal:        General: No edema.     Cervical back: Neck supple.  Skin:    General: Skin is warm and dry.  Neurological:     General: No focal deficit present.     Mental Status: He is alert and oriented to person, place, and time.  Psychiatric:        Mood and Affect: Mood and affect and mood normal.        Behavior: Behavior normal.      UC Treatments / Results  Labs (all labs ordered are listed, but only abnormal results are displayed) Labs Reviewed - No data to display  EKG   Radiology No results found.  Procedures Procedures (including critical care time)  Medications Ordered in UC Medications - No data to display  Initial  Impression / Assessment and Plan / UC Course  I have reviewed the triage vital signs and the nursing notes.  Pertinent labs & imaging results that were available during my care of the patient were reviewed by me and considered  in my medical decision making (see chart for details).   Mild persistent asthma exacerbation.  Patient declines COVID test.  Treating with refill on albuterol inhaler and prednisone burst.  ED precautions discussed.  Instructed patient to follow-up with his PCP if his symptoms are not improving.  He agrees to plan of care.     Final Clinical Impressions(s) / UC Diagnoses   Final diagnoses:  Mild persistent asthma with exacerbation     Discharge Instructions     Take the prednisone and use the albuterol inhaler as directed.    Follow up with your primary care provider if your symptoms are not improving.        ED Prescriptions    Medication Sig Dispense Auth. Provider   albuterol (VENTOLIN HFA) 108 (90 Base) MCG/ACT inhaler Inhale 1-2 puffs into the lungs every 6 (six) hours as needed for wheezing or shortness of breath. 18 g Mickie Bail, NP   predniSONE (DELTASONE) 10 MG tablet Take 4 tablets (40 mg total) by mouth daily for 5 days. 20 tablet Mickie Bail, NP     PDMP not reviewed this encounter.   Mickie Bail, NP 01/01/21 [phone]

## 2021-01-01 NOTE — Discharge Instructions (Signed)
Take the prednisone and use the albuterol inhaler as directed.  Follow up with your primary care provider if your symptoms are not improving.    

## 2021-03-10 ENCOUNTER — Ambulatory Visit (HOSPITAL_COMMUNITY)
Admission: EM | Admit: 2021-03-10 | Discharge: 2021-03-10 | Disposition: A | Discharge status: Home / Self Care | Payer: Self-pay | Attending: Medical Oncology | Admitting: Medical Oncology

## 2021-03-10 ENCOUNTER — Encounter (HOSPITAL_COMMUNITY): Payer: Self-pay

## 2021-03-10 ENCOUNTER — Ambulatory Visit (INDEPENDENT_AMBULATORY_CARE_PROVIDER_SITE_OTHER): Payer: Self-pay

## 2021-03-10 ENCOUNTER — Other Ambulatory Visit: Payer: Self-pay

## 2021-03-10 DIAGNOSIS — Z7951 Long term (current) use of inhaled steroids: Secondary | ICD-10-CM | POA: Insufficient documentation

## 2021-03-10 DIAGNOSIS — Z6836 Body mass index (BMI) 36.0-36.9, adult: Secondary | ICD-10-CM | POA: Insufficient documentation

## 2021-03-10 DIAGNOSIS — J4521 Mild intermittent asthma with (acute) exacerbation: Secondary | ICD-10-CM | POA: Insufficient documentation

## 2021-03-10 DIAGNOSIS — E669 Obesity, unspecified: Secondary | ICD-10-CM | POA: Insufficient documentation

## 2021-03-10 DIAGNOSIS — R059 Cough, unspecified: Secondary | ICD-10-CM

## 2021-03-10 DIAGNOSIS — R0602 Shortness of breath: Secondary | ICD-10-CM

## 2021-03-10 DIAGNOSIS — Z20822 Contact with and (suspected) exposure to covid-19: Secondary | ICD-10-CM | POA: Insufficient documentation

## 2021-03-10 MED ORDER — ALBUTEROL SULFATE HFA 108 (90 BASE) MCG/ACT IN AERS: 1.0000 | INHALATION_SPRAY | Freq: Four times a day (QID) | RESPIRATORY_TRACT | 0 refills | Status: DC | PRN

## 2021-03-10 MED ORDER — METHYLPREDNISOLONE SODIUM SUCC 125 MG IJ SOLR
125.0000 mg | Freq: Once | INTRAMUSCULAR | Status: AC
  Administered 2021-03-10: 125 mg via INTRAMUSCULAR

## 2021-03-10 MED ORDER — PREDNISONE 10 MG (21) PO TBPK: ORAL_TABLET | Freq: Every day | ORAL | 0 refills | Status: DC

## 2021-03-10 MED ORDER — METHYLPREDNISOLONE SODIUM SUCC 125 MG IJ SOLR
INTRAMUSCULAR | Status: AC
  Filled 2021-03-10: qty 2

## 2021-03-10 NOTE — ED Triage Notes (Signed)
Pt presents with nasal congestion, cough and shortness of breath x 5 days. DayQuil and NyQuil gives no relief.

## 2021-03-10 NOTE — ED Provider Notes (Signed)
[facility]    CSN: [id number] Arrival date & time: 03/10/21  1415      History   Chief Complaint Chief Complaint  Patient presents with  . Nasal Congestion  . Cough  . Shortness of Breath    HPI Russell [name] is a 30 y.o. male.   HPI  Cough: Pt states that for the past 5 days Russell [name] has had nasal congestion, a dry cough, wheezing and SOB. Similar to past asthma exacerbations and bronchitis. Dayquil and Nyquil have been helpful for the nasal congestion and mildly for the cough. Russell [name] reports that Russell [name] is out of his asthma medication. No recent known fever, chest pain, calf pain.   Past Medical History:  Diagnosis Date  . Allergy   . Anxiety   . Asthma   . Obesity     Patient Active Problem List   Diagnosis Date Noted  . GAD (generalized anxiety disorder) 12/10/2015  . Tinnitus 04/09/2015  . Bronchospasm 03/02/2015  . Allergy 03/02/2015  . 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 BMI 36.0-36.9,adult 11/02/2014    Past Surgical History:  Procedure Laterality Date  . TONSILLECTOMY    . TYMPANOSTOMY TUBE PLACEMENT         Home Medications    Prior to Admission medications   Medication Sig Start Date End Date Taking? Authorizing Provider  predniSONE (STERAPRED UNI-PAK 21 TAB) 10 MG (21) TBPK tablet Take by mouth daily. Take 6 tabs by mouth daily  for 2 days, then 5 tabs for 2 days, then 4 tabs for 2 days, then 3 tabs for 2 days, 2 tabs for 2 days, then 1 tab by mouth daily for 2 days 03/11/21  Yes Leza Apsey M, PA-C  Pseudoeph-Doxylamine-DM-APAP (DAYQUIL/NYQUIL COLD/FLU RELIEF PO) Take by mouth.   Yes [provider]  albuterol (VENTOLIN HFA) 108 (90 Base) MCG/ACT inhaler Inhale 1-2 puffs into the lungs every 6 (six) hours as needed for wheezing or shortness of breath. 03/10/21   Rushie Chestnut, PA-C  EPINEPHrine (EPIPEN) 0.3 mg/0.3 mL DEVI Inject 0.3 mLs (0.3 mg total) into the muscle once. 07/11/12   Nelva Nay, PA-C  fluticasone (FLONASE) 50  MCG/ACT nasal spray Place 1-2 sprays into both nostrils daily. 10/08/20   Wieters, Hallie C, PA-C  ibuprofen (ADVIL) 800 MG tablet Take 1 tablet (800 mg total) by mouth 3 (three) times daily. 10/08/20   Wieters, Hallie C, PA-C  ondansetron (ZOFRAN ODT) 4 MG disintegrating tablet Take 1 tablet (4 mg total) by mouth every 8 (eight) hours as needed for nausea or vomiting. 10/08/20   Wieters, Hallie C, PA-C  famotidine (PEPCID) 20 MG tablet Take 1 tablet (20 mg total) by mouth 2 (two) times daily. 08/08/19 10/08/20  Wallis Bamberg, PA-C  Fluticasone-Salmeterol (ADVAIR DISKUS) 250-50 MCG/DOSE AEPB Inhale 1 puff into the lungs 2 (two) times daily. 08/15/19 10/08/20  Merrilee Jansky, MD  mometasone-formoterol (DULERA) 200-5 MCG/ACT AERO Inhale 2 puffs into the lungs 2 (two) times daily. 03/20/20 10/08/20  Moshe Cipro, NP  montelukast (SINGULAIR) 10 MG tablet Take 1 tablet (10 mg total) by mouth at bedtime. 03/20/20 10/08/20  Moshe Cipro, NP    Family History Family History  Problem Relation Age of Onset  . Hypertension Mother   . Mental illness Mother   . Stroke Father   . Heart disease Maternal Grandfather   . Mental retardation Maternal Uncle     Social History Social History   Tobacco Use  . Smoking status: Never  Smoker  . Smokeless tobacco: Current User    Types: Chew  Vaping Use  . Vaping Use: Every day  Substance Use Topics  . Alcohol use: Yes    Comment: occasionally  . Drug use: No     Allergies   Penicillins   Review of Systems Review of Systems  As stated above in HPI Physical Exam Triage Vital Signs ED Triage Vitals [03/10/21 1439]  Enc Vitals Group     BP 126/73     Pulse Rate 88     Resp (!) 22     Temp 98.7 F (37.1 C)     Temp Source Oral     SpO2 98 %     Weight      Height      Head Circumference      Peak Flow      Pain Score 0     Pain Loc      Pain Edu?      Excl. in GC?    No data found.  Updated Vital Signs BP 126/73 (BP  Location: Right Arm)   Pulse 88   Temp 98.7 F (37.1 C) (Oral)   Resp (!) 22   SpO2 98%   Physical Exam Vitals and nursing note reviewed.  Constitutional:      General: Russell [name] is not in acute distress.    Appearance: Russell [name] is well-developed. Russell [name] is not ill-appearing, toxic-appearing or diaphoretic.     Interventions: Russell [name] is not intubated. Cardiovascular:     Rate and Rhythm: Normal rate and regular rhythm.  Pulmonary:     Effort: Pulmonary effort is normal. No tachypnea, bradypnea, accessory muscle usage or respiratory distress. Russell [name] is not intubated.     Breath sounds: No stridor. Examination of the right-upper field reveals wheezing. Examination of the left-upper field reveals wheezing. Examination of the right-middle field reveals wheezing. Examination of the left-middle field reveals wheezing. Examination of the right-lower field reveals wheezing. Examination of the left-lower field reveals wheezing. Wheezing present.  Chest:     Chest wall: No mass, deformity, tenderness, crepitus or edema. There is no dullness to percussion.  Neurological:     Mental Status: Russell [name] is alert.      UC Treatments / Results  Labs (all labs ordered are listed, but only abnormal results are displayed) Labs Reviewed  SARS CORONAVIRUS 2 (TAT 6-24 HRS)    EKG   Radiology DG Chest 2 View  Result Date: 03/10/2021 CLINICAL DATA:  Short of breath, cough, nasal congestion EXAM: CHEST - 2 VIEW COMPARISON:  04/26/2020 FINDINGS: The heart size and mediastinal contours are within normal limits. Both lungs are clear. The visualized skeletal structures are unremarkable. IMPRESSION: No active cardiopulmonary disease. Electronically Signed   By: Sharlet Salina M.D.   On: 03/10/2021 15:19    Procedures Procedures (including critical care time)  Medications Ordered in UC Medications  methylPREDNISolone sodium succinate (SOLU-MEDROL) 125 mg/2 mL injection 125 mg (125 mg Intramuscular Given 03/10/21 1512)    Initial  Impression / Assessment and Plan / UC Course  I have reviewed the triage vital signs and the nursing notes.  Pertinent labs & imaging results that were available during my care of the patient were reviewed by me and considered in my medical decision making (see chart for details).     New. Asthma exacerbation. COVID testing pending. Chest x ray does not show pneumonia. Treating with steroids and albuterol to prevent systemic illness. Red flag signs and symptoms discussed.  Final Clinical Impressions(s) / UC Diagnoses   Final diagnoses:  Mild intermittent asthma with acute exacerbation   Discharge Instructions   None    ED Prescriptions    Medication Sig Dispense Auth. Provider   albuterol (VENTOLIN HFA) 108 (90 Base) MCG/ACT inhaler Inhale 1-2 puffs into the lungs every 6 (six) hours as needed for wheezing or shortness of breath. 18 g Ainsley Deakins M, PA-C   predniSONE (STERAPRED UNI-PAK 21 TAB) 10 MG (21) TBPK tablet Take by mouth daily. Take 6 tabs by mouth daily  for 2 days, then 5 tabs for 2 days, then 4 tabs for 2 days, then 3 tabs for 2 days, 2 tabs for 2 days, then 1 tab by mouth daily for 2 days 42 tablet Xiara Knisley, Brand Males, [place]     PDMP not reviewed this encounter.   Rushie Chestnut, [place] 03/10/21 1538

## 2021-03-11 LAB — SARS CORONAVIRUS 2 (TAT 6-24 HRS): SARS Coronavirus 2: NEGATIVE

## 2021-05-11 ENCOUNTER — Other Ambulatory Visit: Payer: Self-pay

## 2021-05-11 ENCOUNTER — Ambulatory Visit (HOSPITAL_COMMUNITY)
Admission: EM | Admit: 2021-05-11 | Discharge: 2021-05-11 | Disposition: A | Discharge status: Home / Self Care | Payer: Self-pay | Attending: Emergency Medicine | Admitting: Emergency Medicine

## 2021-05-11 DIAGNOSIS — B349 Viral infection, unspecified: Secondary | ICD-10-CM

## 2021-05-11 DIAGNOSIS — G4700 Insomnia, unspecified: Secondary | ICD-10-CM

## 2021-05-11 DIAGNOSIS — J45909 Unspecified asthma, uncomplicated: Secondary | ICD-10-CM

## 2021-05-11 MED ORDER — ALBUTEROL SULFATE HFA 108 (90 BASE) MCG/ACT IN AERS: 1.0000 | INHALATION_SPRAY | Freq: Four times a day (QID) | RESPIRATORY_TRACT | 0 refills | Status: DC | PRN

## 2021-05-11 NOTE — ED Provider Notes (Signed)
[facility]    CSN: [id number] Arrival date & time: 05/11/21  1559      History   Chief Complaint Chief Complaint  Patient presents with  . Emesis  . Medication Refill    HPI Russell [name] is a 30 y.o. male.   Patient here for evaluation after having an episode of vomiting over the weekend with body aches and fatigue.  Also reports having some difficulty sleeping.  Does report history of insomnia and was taking trazodone in the past.  Has not taken any OTC medications or treatments. Denies any recent sick contacts.  Also requesting a refill on inhaler as he has a history of asthma.  Denies any trauma, injury, or other precipitating event.  Denies any specific alleviating or aggravating factors.  Denies any fevers, chest pain, shortness of breath, N/V/D, numbness, tingling, weakness, abdominal pain, or headaches.    The history is provided by the patient.    Past Medical History:  Diagnosis Date  . Allergy   . Anxiety   . Asthma   . Obesity     Patient Active Problem List   Diagnosis Date Noted  . GAD (generalized anxiety disorder) 12/10/2015  . Tinnitus 04/09/2015  . Bronchospasm 03/02/2015  . Allergy 03/02/2015  . 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 BMI 36.0-36.9,adult 11/02/2014    Past Surgical History:  Procedure Laterality Date  . TONSILLECTOMY    . TYMPANOSTOMY TUBE PLACEMENT         Home Medications    Prior to Admission medications   Medication Sig Start Date End Date Taking? Authorizing Provider  albuterol (VENTOLIN HFA) 108 (90 Base) MCG/ACT inhaler Inhale 1-2 puffs into the lungs every 6 (six) hours as needed for wheezing or shortness of breath. 05/11/21  Yes Ivette Loyal, NP  EPINEPHrine (EPIPEN) 0.3 mg/0.3 mL DEVI Inject 0.3 mLs (0.3 mg total) into the muscle once. 07/11/12   Nelva Nay, PA-C  fluticasone (FLONASE) 50 MCG/ACT nasal spray Place 1-2 sprays into both nostrils daily. 10/08/20   Wieters, Hallie C, PA-C  ibuprofen  (ADVIL) 800 MG tablet Take 1 tablet (800 mg total) by mouth 3 (three) times daily. 10/08/20   Wieters, Hallie C, PA-C  ondansetron (ZOFRAN ODT) 4 MG disintegrating tablet Take 1 tablet (4 mg total) by mouth every 8 (eight) hours as needed for nausea or vomiting. 10/08/20   Wieters, Hallie C, PA-C  predniSONE (STERAPRED UNI-PAK 21 TAB) 10 MG (21) TBPK tablet Take by mouth daily. Take 6 tabs by mouth daily  for 2 days, then 5 tabs for 2 days, then 4 tabs for 2 days, then 3 tabs for 2 days, 2 tabs for 2 days, then 1 tab by mouth daily for 2 days 03/11/21   Rushie Chestnut, PA-C  Pseudoeph-Doxylamine-DM-APAP (DAYQUIL/NYQUIL COLD/FLU RELIEF PO) Take by mouth.    [provider]  famotidine (PEPCID) 20 MG tablet Take 1 tablet (20 mg total) by mouth 2 (two) times daily. 08/08/19 10/08/20  Wallis Bamberg, PA-C  Fluticasone-Salmeterol (ADVAIR DISKUS) 250-50 MCG/DOSE AEPB Inhale 1 puff into the lungs 2 (two) times daily. 08/15/19 10/08/20  Merrilee Jansky, MD  mometasone-formoterol (DULERA) 200-5 MCG/ACT AERO Inhale 2 puffs into the lungs 2 (two) times daily. 03/20/20 10/08/20  Moshe Cipro, NP  montelukast (SINGULAIR) 10 MG tablet Take 1 tablet (10 mg total) by mouth at bedtime. 03/20/20 10/08/20  Moshe Cipro, NP    Family History Family History  Problem Relation Age of Onset  .  Hypertension Mother   . Mental illness Mother   . Stroke Father   . Heart disease Maternal Grandfather   . Mental retardation Maternal Uncle     Social History Social History   Tobacco Use  . Smoking status: Never Smoker  . Smokeless tobacco: Current User    Types: Chew  Vaping Use  . Vaping Use: Every day  Substance Use Topics  . Alcohol use: Yes    Comment: occasionally  . Drug use: No     Allergies   Penicillins   Review of Systems Review of Systems  Constitutional: Positive for fatigue.  Gastrointestinal: Positive for vomiting.  Musculoskeletal: Positive for myalgias.  All other  systems reviewed and are negative.    Physical Exam Triage Vital Signs ED Triage Vitals  Enc Vitals Group     BP 05/11/21 1645 (!) 130/91     Pulse Rate 05/11/21 1645 87     Resp 05/11/21 1645 18     Temp 05/11/21 1645 98.7 F (37.1 C)     Temp src --      SpO2 05/11/21 1645 96 %     Weight --      Height --      Head Circumference --      Peak Flow --      Pain Score 05/11/21 1644 3     Pain Loc --      Pain Edu? --      Excl. in GC? --    No data found.  Updated Vital Signs BP (!) 130/91   Pulse 87   Temp 98.7 F (37.1 C)   Resp 18   SpO2 96%   Visual Acuity Right Eye Distance:   Left Eye Distance:   Bilateral Distance:    Right Eye Near:   Left Eye Near:    Bilateral Near:     Physical Exam Vitals and nursing note reviewed.  Constitutional:      General: He is not in acute distress.    Appearance: Normal appearance. He is not ill-appearing, toxic-appearing or diaphoretic.  HENT:     Head: Normocephalic and atraumatic.  Eyes:     Conjunctiva/sclera: Conjunctivae normal.  Cardiovascular:     Rate and Rhythm: Normal rate.     Pulses: Normal pulses.  Pulmonary:     Effort: Pulmonary effort is normal.  Abdominal:     General: Abdomen is flat.  Musculoskeletal:        General: Normal range of motion.     Cervical back: Normal range of motion.  Skin:    General: Skin is warm and dry.  Neurological:     General: No focal deficit present.     Mental Status: He is alert and oriented to person, place, and time.  Psychiatric:        Mood and Affect: Mood normal.      UC Treatments / Results  Labs (all labs ordered are listed, but only abnormal results are displayed) Labs Reviewed - No data to display  EKG   Radiology No results found.  Procedures Procedures (including critical care time)  Medications Ordered in UC Medications - No data to display  Initial Impression / Assessment and Plan / UC Course  I have reviewed the triage vital  signs and the nursing notes.  Pertinent labs & imaging results that were available during my care of the patient were reviewed by me and considered in my medical decision making (see chart for details).  Assessment negative for red flags or concerns.  This most likely a viral illness.  Discussed trying a brat or bland food diet and advancing as tolerated.  May also use ginger or mint for nausea.  Discussed conservative symptom management as described in discharge instructions.  Discussed good sleep hygiene for insomnia, including using melatonin or OTC sleep aids and ensuring the room is cool, dark, and quiet.  Discussed trying relaxation and meditation.  Patient is in no distress from asthma but albuterol inhaler refilled.  PCP assistance started for long-term management of insomnia and asthma. Final Clinical Impressions(s) / UC Diagnoses   Final diagnoses:  Viral illness  Insomnia, unspecified type  Mild asthma without complication, unspecified whether persistent     Discharge Instructions     You can take Tylenol and/or Ibuprofen as needed for fever reduction and pain relief.   Try a BRAT (bananas, rice, applesause, toast) or bland food diet for the next few days.  Stick with foods that are gentle on your stomach.  Avoid foods that are difficult to digest, such as diary.  As you feel better, you can start eating like you do normally.    You can use ginger (ginger ale, ginger candy) and mint for nausea.   For cough: honey 1/2 to 1 teaspoon (you can dilute the honey in water or another fluid).  You can also use guaifenesin and dextromethorphan for cough. You can use a humidifier for chest congestion and cough.  If you don't have a humidifier, you can sit in the bathroom with the hot shower running.     For sore throat: try warm salt water gargles, cepacol lozenges, throat spray, warm tea or water with lemon/honey, popsicles or ice, or OTC cold relief medicine for throat discomfort.     For congestion: take a daily anti-histamine like Zyrtec, Claritin, and a oral decongestant, such as pseudoephedrine.  You can also use Flonase 1-2 sprays in each nostril daily.    It is important to stay hydrated: drink plenty of fluids (water, gatorade/powerade/pedialyte, juices, or teas) to keep your throat moisturized and help further relieve irritation/discomfort.   Try melatonin or OTC sleep aids for insomnia.  Make sure your room is dark and quiet.  You can also try meditation or other relaxation exercises to  help with sleep.    Someone will contact you to help you get established with a primary care provider for long term management of your insomnia and asthma.   Return or go to the Emergency Department if symptoms worsen or do not improve in the next few days.      ED Prescriptions    Medication Sig Dispense Auth. Provider   albuterol (VENTOLIN HFA) 108 (90 Base) MCG/ACT inhaler Inhale 1-2 puffs into the lungs every 6 (six) hours as needed for wheezing or shortness of breath. 18 g Ivette Loyal, NP     PDMP not reviewed this encounter.   Ivette Loyal, NP 05/11/21 [phone]

## 2021-05-11 NOTE — ED Triage Notes (Signed)
Vomited Sat better now but needs a refill on asthma med.

## 2021-05-11 NOTE — Discharge Instructions (Addendum)
You can take Tylenol and/or Ibuprofen as needed for fever reduction and pain relief.   Try a BRAT (bananas, rice, applesause, toast) or bland food diet for the next few days.  Stick with foods that are gentle on your stomach.  Avoid foods that are difficult to digest, such as diary.  As you feel better, you can start eating like you do normally.    You can use ginger (ginger ale, ginger candy) and mint for nausea.   For cough: honey 1/2 to 1 teaspoon (you can dilute the honey in water or another fluid).  You can also use guaifenesin and dextromethorphan for cough. You can use a humidifier for chest congestion and cough.  If you don't have a humidifier, you can sit in the bathroom with the hot shower running.     For sore throat: try warm salt water gargles, cepacol lozenges, throat spray, warm tea or water with lemon/honey, popsicles or ice, or OTC cold relief medicine for throat discomfort.    For congestion: take a daily anti-histamine like Zyrtec, Claritin, and a oral decongestant, such as pseudoephedrine.  You can also use Flonase 1-2 sprays in each nostril daily.    It is important to stay hydrated: drink plenty of fluids (water, gatorade/powerade/pedialyte, juices, or teas) to keep your throat moisturized and help further relieve irritation/discomfort.   Try melatonin or OTC sleep aids for insomnia.  Make sure your room is dark and quiet.  You can also try meditation or other relaxation exercises to  help with sleep.    Someone will contact you to help you get established with a primary care provider for long term management of your insomnia and asthma.   Return or go to the Emergency Department if symptoms worsen or do not improve in the next few days.

## 2021-05-17 ENCOUNTER — Encounter: Payer: Self-pay | Admitting: *Deleted

## 2021-10-16 ENCOUNTER — Other Ambulatory Visit: Payer: Self-pay

## 2021-10-16 ENCOUNTER — Ambulatory Visit (HOSPITAL_COMMUNITY)
Admission: EM | Admit: 2021-10-16 | Discharge: 2021-10-16 | Disposition: A | Discharge status: Home / Self Care | Payer: 59 | Attending: Medical Oncology | Admitting: Medical Oncology

## 2021-10-16 ENCOUNTER — Encounter (HOSPITAL_COMMUNITY): Payer: Self-pay | Admitting: Emergency Medicine

## 2021-10-16 DIAGNOSIS — L282 Other prurigo: Secondary | ICD-10-CM

## 2021-10-16 MED ORDER — IVERMECTIN 3 MG PO TABS: 200.0000 ug/kg | ORAL_TABLET | ORAL | 0 refills | Status: DC

## 2021-10-16 MED ORDER — PERMETHRIN 5 % EX CREA: TOPICAL_CREAM | CUTANEOUS | 0 refills | Status: DC

## 2021-10-16 NOTE — ED Triage Notes (Signed)
Pt c/o rash on arms, legs, groin area since Tuesday. Reports itches. Denies new sopas, lotions, detergents.

## 2021-10-16 NOTE — ED Provider Notes (Signed)
[facility]    CSN: [id number] Arrival date & time: 10/16/21  1157      History   Chief Complaint Chief Complaint  Patient presents with   Rash    HPI Russell [name] is a 30 y.o. male.   HPI  Rash: Pt reports that he has had an itchy rash of the arms, legs and groin since Tuesday. Severely itchy. He denies any new medications, soaps, products or suspected foods of concern. He has tried prednisone for symptoms from a telehealth visit without improvement.Wife is now having a similar rash.   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 Authorizing Provider  albuterol (VENTOLIN HFA) 108 (90 Base) MCG/ACT inhaler Inhale 1-2 puffs into the lungs every 6 (six) hours as needed for wheezing or shortness of breath. 05/11/21   Ivette Loyal, NP  EPINEPHrine (EPIPEN) 0.3 mg/0.3 mL DEVI Inject 0.3 mLs (0.3 mg total) into the muscle once. 07/11/12   Nelva Nay, PA-C  fluticasone (FLONASE) 50 MCG/ACT nasal spray Place 1-2 sprays into both nostrils daily. 10/08/20   Wieters, Hallie C, PA-C  ibuprofen (ADVIL) 800 MG tablet Take 1 tablet (800 mg total) by mouth 3 (three) times daily. 10/08/20   Wieters, Hallie C, PA-C  ondansetron (ZOFRAN ODT) 4 MG disintegrating tablet Take 1 tablet (4 mg total) by mouth every 8 (eight) hours as needed for nausea or vomiting. 10/08/20   Wieters, Hallie C, PA-C  predniSONE (STERAPRED UNI-PAK 21 TAB) 10 MG (21) TBPK tablet Take by mouth daily. Take 6 tabs by  mouth daily  for 2 days, then 5 tabs for 2 days, then 4 tabs for 2 days, then 3 tabs for 2 days, 2 tabs for 2 days, then 1 tab by mouth daily for 2 days 03/11/21   Rushie Chestnut, PA-C  Pseudoeph-Doxylamine-DM-APAP (DAYQUIL/NYQUIL COLD/FLU RELIEF PO) Take by mouth.    [provider]  famotidine (PEPCID) 20 MG tablet Take 1 tablet (20 mg total) by mouth 2 (two) times daily. 08/08/19 10/08/20  Wallis Bamberg, PA-C  Fluticasone-Salmeterol (ADVAIR DISKUS) 250-50 MCG/DOSE AEPB Inhale 1 puff into the lungs 2 (two) times daily. 08/15/19 10/08/20  Merrilee Jansky, MD  mometasone-formoterol (DULERA) 200-5 MCG/ACT AERO Inhale 2 puffs into the lungs 2 (two) times daily. 03/20/20 10/08/20  Moshe Cipro, NP  montelukast (SINGULAIR) 10 MG tablet Take 1 tablet (10 mg total) by mouth at bedtime. 03/20/20 10/08/20  Moshe Cipro, NP    Family History Family History  Problem Relation Age of Onset   Hypertension Mother    Mental illness Mother    Stroke Father    Heart disease Maternal Grandfather    Mental retardation Maternal Uncle     Social History Social History   Tobacco Use   Smoking status: Never   Smokeless tobacco: Current    Types: Chew  Vaping Use   Vaping Use: Every day  Substance Use Topics   Alcohol use: Yes    Comment: occasionally   Drug use: No     Allergies   Penicillins   Review of Systems Review of Systems  As stated above in HPI Physical Exam Triage Vital Signs ED Triage Vitals  Enc Vitals Group     BP 10/16/21 1330 137/87     Pulse Rate 10/16/21 1330 74     Resp 10/16/21 1330 17     Temp 10/16/21 1330 97.8 F (36.6 C)     Temp Source 10/16/21 1330 Oral     SpO2 10/16/21 1330 97 %     Weight --      Height --      Head Circumference --      Peak Flow --      Pain Score 10/16/21 1328 0     Pain Loc --      Pain Edu? --      Excl. in GC? --    No data found.  Updated Vital Signs BP 137/87 (BP Location: Right Arm)   Pulse 74   Temp  97.8 F (36.6 C) (Oral)   Resp 17   SpO2 97%   Physical Exam Vitals and nursing note reviewed.  Constitutional:      Appearance: Normal appearance.  Skin:    General: Skin is warm.     Findings: Rash (Scattered raised slightly erythematic papules of body with some evidence of burrow formation of the hands. NO bleeding, discharge) present.  Neurological:     Mental Status: He is alert and oriented to person, place, and time.     UC Treatments / Results  Labs (all labs ordered are listed, but only abnormal results are displayed) Labs Reviewed - No data to display  EKG   Radiology No results found.  Procedures Procedures (including critical care time)  Medications Ordered in UC Medications - No data to display  Initial Impression / Assessment and Plan / UC Course  I have reviewed the triage vital signs and the nursing notes.  Pertinent labs & imaging results that were available during my care of the patient were reviewed by me and considered in my medical decision making (see chart for details).     New. As his rash is not improving with prednisone and given the nature of the rash we are going to treat for scabies with ivermectin and permethrin. Discussed with patient including scabies information. Follow up PRN.  Final Clinical Impressions(s) / UC Diagnoses   Final diagnoses:  None   Discharge Instructions   None    ED Prescriptions   None    PDMP not reviewed this encounter.   Rushie Chestnut, [place] 10/16/21 1418

## 2021-11-16 ENCOUNTER — Encounter (HOSPITAL_BASED_OUTPATIENT_CLINIC_OR_DEPARTMENT_OTHER): Payer: Self-pay | Admitting: Emergency Medicine

## 2021-11-16 ENCOUNTER — Other Ambulatory Visit: Payer: Self-pay

## 2021-11-16 ENCOUNTER — Emergency Department (HOSPITAL_BASED_OUTPATIENT_CLINIC_OR_DEPARTMENT_OTHER)
Admission: EM | Admit: 2021-11-16 | Discharge: 2021-11-16 | Disposition: A | Discharge status: Home / Self Care | Payer: 59 | Attending: Emergency Medicine | Admitting: Emergency Medicine

## 2021-11-16 DIAGNOSIS — M25511 Pain in right shoulder: Secondary | ICD-10-CM | POA: Diagnosis not present

## 2021-11-16 DIAGNOSIS — M25512 Pain in left shoulder: Secondary | ICD-10-CM | POA: Diagnosis not present

## 2021-11-16 DIAGNOSIS — M25522 Pain in left elbow: Secondary | ICD-10-CM | POA: Insufficient documentation

## 2021-11-16 DIAGNOSIS — Z5321 Procedure and treatment not carried out due to patient leaving prior to being seen by health care provider: Secondary | ICD-10-CM | POA: Diagnosis not present

## 2021-11-16 DIAGNOSIS — M25562 Pain in left knee: Secondary | ICD-10-CM | POA: Insufficient documentation

## 2021-11-16 DIAGNOSIS — M25561 Pain in right knee: Secondary | ICD-10-CM | POA: Diagnosis not present

## 2021-11-16 NOTE — ED Triage Notes (Signed)
Pt arrives pov with c/o bilateral shoulder pain and bilateral knee pain, and left elbow x 2 weeks. Pt denies CP, endorses lifting at work

## 2021-12-17 ENCOUNTER — Ambulatory Visit (HOSPITAL_COMMUNITY)
Admission: EM | Admit: 2021-12-17 | Discharge: 2021-12-17 | Disposition: A | Discharge status: Home / Self Care | Payer: 59 | Attending: Physician Assistant | Admitting: Physician Assistant

## 2021-12-17 ENCOUNTER — Other Ambulatory Visit: Payer: Self-pay

## 2021-12-17 ENCOUNTER — Encounter (HOSPITAL_COMMUNITY): Payer: Self-pay | Admitting: *Deleted

## 2021-12-17 DIAGNOSIS — J4540 Moderate persistent asthma, uncomplicated: Secondary | ICD-10-CM

## 2021-12-17 MED ORDER — ALBUTEROL SULFATE HFA 108 (90 BASE) MCG/ACT IN AERS: 2.0000 | INHALATION_SPRAY | Freq: Four times a day (QID) | RESPIRATORY_TRACT | 2 refills | Status: DC | PRN

## 2021-12-17 MED ORDER — PREDNISONE 50 MG PO TABS: ORAL_TABLET | ORAL | 0 refills | Status: DC

## 2021-12-17 MED ORDER — DULERA 100-5 MCG/ACT IN AERO: INHALATION_SPRAY | RESPIRATORY_TRACT | 1 refills | Status: DC

## 2021-12-17 NOTE — ED Triage Notes (Signed)
Pt reports going to [place] over holidays and now has a sinus infection.

## 2021-12-17 NOTE — ED Provider Notes (Signed)
[facility]    CSN: [id number] Arrival date & time: 12/17/21  1115      History   Chief Complaint Chief Complaint  Patient presents with   Sinusitis    HPI Russell [name] is a 30 y.o. male.   Pt complains of asthma and sinus pressure.  Pt is out of his asthma medications   The history is provided by the patient. No language interpreter was used.  Sinusitis Pain details:    Location:  Frontal   Quality:  Aching   Severity:  Moderate   Timing:  Constant Duration:  4 days Progression:  Worsening Chronicity:  New Relieved by:  Nothing Worsened by:  Nothing Ineffective treatments:  None tried Associated symptoms: nausea    Past Medical History:  Diagnosis Date   Allergy    Anxiety    Asthma    Obesity     Patient Active Problem List   Diagnosis Date Noted   GAD (generalized anxiety disorder) 12/10/2015   Tinnitus 04/09/2015   Bronchospasm 03/02/2015   Allergy 03/02/2015   Complicated grief 03/02/2015   BMI 36.0-36.9,adult 11/02/2014    Past Surgical History:  Procedure Laterality Date   TONSILLECTOMY     TYMPANOSTOMY TUBE PLACEMENT         Home Medications    Prior to Admission medications   Medication Sig Start Date End Date Taking? Authorizing Provider  albuterol (VENTOLIN HFA) 108 (90 Base) MCG/ACT inhaler Inhale 2 puffs into the lungs every 6 (six) hours as needed for wheezing or shortness of breath. 12/17/21  Yes Elson Areas, PA-C  mometasone-formoterol [facility]) 100-5 MCG/ACT AERO Two inhalations  twice daily 12/17/21  Yes Elson Areas, [place]  predniSONE (DELTASONE) 50 MG tablet One a day 12/17/21  Yes Elson Areas, PA-C  EPINEPHrine (EPIPEN) 0.3 mg/0.3 mL DEVI Inject 0.3 mLs (0.3 mg total) into the muscle once. 07/11/12   Nelva Nay, PA-C  fluticasone (FLONASE) 50 MCG/ACT nasal spray Place 1-2 sprays into both nostrils daily. 10/08/20   Wieters, Hallie C, PA-C  ibuprofen (ADVIL) 800 MG tablet Take 1 tablet (800 mg  total) by mouth 3 (three) times daily. 10/08/20   Wieters, Hallie C, PA-C  ivermectin (STROMECTOL) 3 MG TABS tablet Take 9 tablets (27,000 mcg total) by mouth once a week. 10/16/21   Rushie Chestnut, PA-C  ondansetron (ZOFRAN ODT) 4 MG disintegrating tablet Take 1 tablet (4 mg total) by mouth every 8 (eight) hours as needed for nausea or vomiting. 10/08/20   Wieters, Hallie C, PA-C  permethrin (ELIMITE) 5 % cream Cream 5%: Thoroughly massage cream from head to soles of feet; leave on for 8 to 14 hours before removing (shower or bath); for elderly patients, also apply on the hairline, neck, scalp, temple, and forehead; may repeat if living mites are observed 14 days after first treatment 10/16/21   Rushie Chestnut, PA-C  Pseudoeph-Doxylamine-DM-APAP (DAYQUIL/NYQUIL COLD/FLU RELIEF PO) Take by mouth.    [provider]  famotidine (PEPCID) 20 MG tablet Take 1 tablet (20 mg total) by mouth 2 (two) times daily. 08/08/19 10/08/20  Wallis Bamberg, PA-C  Fluticasone-Salmeterol (ADVAIR DISKUS) 250-50 MCG/DOSE AEPB Inhale 1 puff into the lungs 2 (two) times daily. 08/15/19 10/08/20  Merrilee Jansky, MD  montelukast (SINGULAIR) 10 MG tablet Take 1 tablet (10 mg total) by mouth at bedtime. 03/20/20 10/08/20  Moshe Cipro, NP    Family History Family History  Problem Relation Age of Onset  Hypertension Mother    Mental illness Mother    Stroke Father    Heart disease Maternal Grandfather    Mental retardation Maternal Uncle     Social History Social History   Tobacco Use   Smoking status: Never   Smokeless tobacco: Current    Types: Chew  Vaping Use   Vaping Use: Every day  Substance Use Topics   Alcohol use: Yes    Comment: occasionally   Drug use: No     Allergies   Penicillins   Review of Systems Review of Systems  Gastrointestinal:  Positive for nausea.  All other systems reviewed and are negative.   Physical Exam Triage Vital Signs ED Triage Vitals [12/17/21  1256]  Enc Vitals Group     BP 119/83     Pulse Rate 98     Resp 18     Temp 99 F (37.2 C)     Temp src      SpO2 96 %     Weight      Height      Head Circumference      Peak Flow      Pain Score      Pain Loc      Pain Edu?      Excl. in GC?    No data found.  Updated Vital Signs BP 119/83    Pulse 98    Temp 99 F (37.2 C)    Resp 18    SpO2 96%   Visual Acuity Right Eye Distance:   Left Eye Distance:   Bilateral Distance:    Right Eye Near:   Left Eye Near:    Bilateral Near:     Physical Exam Vitals and nursing note reviewed.  Constitutional:      Appearance: He is well-developed.  HENT:     Head: Normocephalic.     Right Ear: Tympanic membrane normal.     Left Ear: Tympanic membrane normal.     Mouth/Throat:     Mouth: Mucous membranes are moist.  Cardiovascular:     Rate and Rhythm: Normal rate.  Pulmonary:     Effort: Pulmonary effort is normal.  Abdominal:     General: There is no distension.  Musculoskeletal:        General: Normal range of motion.     Cervical back: Normal range of motion.  Neurological:     Mental Status: He is alert and oriented to person, place, and time.  Psychiatric:        Mood and Affect: Mood normal.     UC Treatments / Results  Labs (all labs ordered are listed, but only abnormal results are displayed) Labs Reviewed - No data to display  EKG   Radiology No results found.  Procedures Procedures (including critical care time)  Medications Ordered in UC Medications - No data to display  Initial Impression / Assessment and Plan / UC Course  I have reviewed the triage vital signs and the nursing notes.  Pertinent labs & imaging results that were available during my care of the patient were reviewed by me and considered in my medical decision making (see chart for details).     MDM:  Pt given rx for albuterol, dulera and prednsione Final Clinical Impressions(s) / UC Diagnoses   Final diagnoses:   Moderate persistent asthma without complication     Discharge Instructions      Return if any problems.    ED Prescriptions  Medication Sig Dispense Auth. Provider   albuterol (VENTOLIN HFA) 108 (90 Base) MCG/ACT inhaler Inhale 2 puffs into the lungs every 6 (six) hours as needed for wheezing or shortness of breath. 8 g Cace Osorto K, PA-C   predniSONE (DELTASONE) 50 MG tablet One a day 6 tablet Eleisha Branscomb K, PA-C   mometasone-formoterol [facility]) 100-5 MCG/ACT AERO Two inhalations  twice daily 1 each Elson Areas, [place]      PDMP not reviewed this encounter.   Elson Areas, [place] 12/17/21 1341

## 2021-12-17 NOTE — Discharge Instructions (Addendum)
Return if any problems.

## 2022-04-20 ENCOUNTER — Telehealth: Payer: 59 | Admitting: Physician Assistant

## 2022-04-20 DIAGNOSIS — J4541 Moderate persistent asthma with (acute) exacerbation: Secondary | ICD-10-CM | POA: Diagnosis not present

## 2022-04-20 DIAGNOSIS — J208 Acute bronchitis due to other specified organisms: Secondary | ICD-10-CM

## 2022-04-20 MED ORDER — PREDNISONE 20 MG PO TABS: 40.0000 mg | ORAL_TABLET | Freq: Every day | ORAL | 0 refills | Status: AC

## 2022-04-20 MED ORDER — MOMETASONE FURO-FORMOTEROL FUM 200-5 MCG/ACT IN AERO: 2.0000 | INHALATION_SPRAY | Freq: Two times a day (BID) | RESPIRATORY_TRACT | 0 refills | Status: DC

## 2022-04-20 MED ORDER — ALBUTEROL SULFATE HFA 108 (90 BASE) MCG/ACT IN AERS: 2.0000 | INHALATION_SPRAY | Freq: Four times a day (QID) | RESPIRATORY_TRACT | 0 refills | Status: DC | PRN

## 2022-04-20 NOTE — Progress Notes (Signed)
?Virtual Visit Consent  ? ?Russell [name], you are scheduled for a virtual visit with a [facility] provider today. Just as with appointments in the office, your consent must be obtained to participate. Your consent will be active for this visit and any virtual visit you may have with one of our providers in the next 365 days. If you have a MyChart account, a copy of this consent can be sent to you electronically. ? ?As this is a virtual visit, video technology does not allow for your provider to perform a traditional examination. This may limit your provider's ability to fully assess your condition. If your provider identifies any concerns that need to be evaluated in person or the need to arrange testing (such as labs, EKG, etc.), we will make arrangements to do so. Although advances in technology are sophisticated, we cannot ensure that it will always work on either your end or our end. If the connection with a video visit is poor, the visit may have to be switched to a telephone visit. With either a video or telephone visit, we are not always able to ensure that we have a secure connection. ? ?By engaging in this virtual visit, you consent to the provision of healthcare and authorize for your insurance to be billed (if applicable) for the services provided during this visit. Depending on your insurance coverage, you may receive a charge related to this service. ? ?I need to obtain your verbal consent now. Are you willing to proceed with your visit today? Russell [name] has provided verbal consent on 04/20/2022 for a virtual visit (video or telephone). Russell Rio, PA-C ? ?Date: 04/20/2022 3:10 PM ? ?Virtual Visit via Video Note  ? ?ILeeanne [name], connected with  Russell [name]  ([id number], 1991/06/02) on 04/20/22 at  2:45 PM EDT by a video-enabled telemedicine application and verified that I am speaking with the correct person using two identifiers. ? ?Location: ?Patient: Virtual Visit Location  Patient: Home ?Provider: Virtual Visit Location Provider: Home Office ?  ?I discussed the limitations of evaluation and management by telemedicine and the availability of in person appointments. The patient expressed understanding and agreed to proceed.   ? ?History of Present Illness: ?Russell [name] is a 31 y.o. who identifies as a male who was assigned male at birth, and is being seen today for URI symptoms starting this past weekend with flare of asthma. Notes symptoms starting Sunday night with rhinorrhea, sore throat, nasal congestion then followed by chest congestion and fatigue. Has felt feverish at times but has not checked temperature. Has had some chills without aches. Cough is mainly dry, sometimes slightly productive. Has had persistent cough and chest tightness. Some wheezing above baseline. Denies chest pain. Daughter sick with similar symptoms. Not positive for COVID or flu -- bronchitis and ear infection.  ? ?Has been out of Dulera and Albuterol for the past week.  ? ? ? ? ?HPI: HPI  ?Problems:  ?Patient Active Problem List  ? Diagnosis Date Noted  ? GAD (generalized anxiety disorder) 12/10/2015  ? Tinnitus 04/09/2015  ? Bronchospasm 03/02/2015  ? Allergy 03/02/2015  ? Complicated grief [id number]  ? BMI 36.0-36.9,adult 11/02/2014  ?  ?Allergies:  ?Allergies  ?Allergen Reactions  ? Penicillins   ?  Unknown  ? ?Medications:  ?Current Outpatient Medications:  ?  albuterol (VENTOLIN HFA) 108 (90 Base) MCG/ACT inhaler, Inhale 2 puffs into the lungs every 6 (six) hours as needed for wheezing or  shortness of breath., Disp: 8 g, Rfl: 0 ?  mometasone-formoterol (DULERA) 200-5 MCG/ACT AERO, Inhale 2 puffs into the lungs 2 (two) times daily., Disp: 13 g, Rfl: 0 ?  predniSONE (DELTASONE) 20 MG tablet, Take 2 tablets (40 mg total) by mouth daily with breakfast for 5 days., Disp: 10 tablet, Rfl: 0 ?  EPINEPHrine (EPIPEN) 0.3 mg/0.3 mL DEVI, Inject 0.3 mLs (0.3 mg total) into the muscle once., Disp: 1 Device,  Rfl: 0 ?  fluticasone (FLONASE) 50 MCG/ACT nasal spray, Place 1-2 sprays into both nostrils daily., Disp: 16 g, Rfl: 0 ?  ibuprofen (ADVIL) 800 MG tablet, Take 1 tablet (800 mg total) by mouth 3 (three) times daily., Disp: 21 tablet, Rfl: 0 ?  Pseudoeph-Doxylamine-DM-APAP (DAYQUIL/NYQUIL COLD/FLU RELIEF PO), Take by mouth., Disp: , Rfl:  ? ?Observations/Objective: ?Patient is well-developed, well-nourished in no acute distress.  ?Resting comfortably at home.  ?Head is normocephalic, atraumatic.  ?No labored breathing. ?Speech is clear and coherent with logical content.  ?Patient is alert and oriented at baseline.  ? ?Assessment and Plan: ?1. Viral bronchitis ?- albuterol (VENTOLIN HFA) 108 (90 Base) MCG/ACT inhaler; Inhale 2 puffs into the lungs every 6 (six) hours as needed for wheezing or shortness of breath.  Dispense: 8 g; Refill: 0 ?- mometasone-formoterol (DULERA) 200-5 MCG/ACT AERO; Inhale 2 puffs into the lungs 2 (two) times daily.  Dispense: 13 g; Refill: 0 ? ?2. Moderate persistent asthma with exacerbation ?- predniSONE (DELTASONE) 20 MG tablet; Take 2 tablets (40 mg total) by mouth daily with breakfast for 5 days.  Dispense: 10 tablet; Refill: 0 ?- albuterol (VENTOLIN HFA) 108 (90 Base) MCG/ACT inhaler; Inhale 2 puffs into the lungs every 6 (six) hours as needed for wheezing or shortness of breath.  Dispense: 8 g; Refill: 0 ?- mometasone-formoterol (DULERA) 200-5 MCG/ACT AERO; Inhale 2 puffs into the lungs 2 (two) times daily.  Dispense: 13 g; Refill: 0 ? ?Supportive measures and OTC medications reviewed. Restart Dulera (med refilled at prior dose -- was given lower dose at [facility] visit last). Albuterol refill sent. Start Mucinex Sinus Max. Increase fluids and rest. Prednisone per orders. Strict in-person precautions reviewed.  ? ?Follow Up Instructions: ?I discussed the assessment and treatment plan with the patient. The patient was provided an opportunity to ask questions and all were answered. The patient  agreed with the plan and demonstrated an understanding of the instructions.  A copy of instructions were sent to the patient via MyChart unless otherwise noted below.  ? ?The patient was advised to call back or seek an in-person evaluation if the symptoms worsen or if the condition fails to improve as anticipated. ? ?Time:  ?I spent 10 minutes with the patient via telehealth technology discussing the above problems/concerns.   ? ?Russell Rio, PA-C ?

## 2022-04-20 NOTE — Patient Instructions (Addendum)
?  Barrington Ellison, thank you for joining Leeanne Rio, PA-C for today's virtual visit.  While this provider is not your primary care provider (PCP), if your PCP is located in our provider database this encounter information will be shared with them immediately following your visit. ? ?Consent: ?(Patient) Russell [name] provided verbal consent for this virtual visit at the beginning of the encounter. ? ?Current Medications: ? ?Current Outpatient Medications:  ?  albuterol (VENTOLIN HFA) 108 (90 Base) MCG/ACT inhaler, Inhale 2 puffs into the lungs every 6 (six) hours as needed for wheezing or shortness of breath., Disp: 8 g, Rfl: 2 ?  EPINEPHrine (EPIPEN) 0.3 mg/0.3 mL DEVI, Inject 0.3 mLs (0.3 mg total) into the muscle once., Disp: 1 Device, Rfl: 0 ?  fluticasone (FLONASE) 50 MCG/ACT nasal spray, Place 1-2 sprays into both nostrils daily., Disp: 16 g, Rfl: 0 ?  ibuprofen (ADVIL) 800 MG tablet, Take 1 tablet (800 mg total) by mouth 3 (three) times daily., Disp: 21 tablet, Rfl: 0 ?  Pseudoeph-Doxylamine-DM-APAP (DAYQUIL/NYQUIL COLD/FLU RELIEF PO), Take by mouth., Disp: , Rfl:   ? ?Medications ordered in this encounter:  ?No orders of the defined types were placed in this encounter. ?  ? ?*If you need refills on other medications prior to your next appointment, please contact your pharmacy* ? ?Follow-Up: ?Call back or seek an in-person evaluation if the symptoms worsen or if the condition fails to improve as anticipated. ? ?Other Instructions ?Please keep well-hydrated and get plenty of rest.  ?Restart your asthma medications as directed. ?Start OTC Mucinex Sinus Max.  ?Run a humidifier in the bedroom at night. ?If not calming down with your [facility] and albuterol, start the prednisone, taking as directed.  ? ? ?If you have been instructed to have an in-person evaluation today at a local Urgent Care facility, please use the link below. It will take you to a list of all of our available [facility],  including address, phone number and hours of operation. Please do not delay care.  ?[facility] ? ?If you or a family member do not have a primary care provider, use the link below to schedule a visit and establish care. When you choose a [facility] primary care physician or advanced practice provider, you gain a long-term partner in health. ?Find a Primary Care Provider ? ?Learn more about [facility]'s in-office and virtual care options: ?[facility]  ?

## 2022-05-30 ENCOUNTER — Encounter (HOSPITAL_COMMUNITY): Payer: Self-pay | Admitting: Emergency Medicine

## 2022-05-30 ENCOUNTER — Ambulatory Visit (HOSPITAL_COMMUNITY): Payer: Self-pay

## 2022-05-30 ENCOUNTER — Ambulatory Visit (HOSPITAL_COMMUNITY)
Admission: EM | Admit: 2022-05-30 | Discharge: 2022-05-30 | Disposition: A | Discharge status: Home / Self Care | Payer: 59 | Attending: Emergency Medicine | Admitting: Emergency Medicine

## 2022-05-30 DIAGNOSIS — H9313 Tinnitus, bilateral: Secondary | ICD-10-CM | POA: Diagnosis not present

## 2022-05-30 DIAGNOSIS — H6120 Impacted cerumen, unspecified ear: Secondary | ICD-10-CM

## 2022-05-30 DIAGNOSIS — J208 Acute bronchitis due to other specified organisms: Secondary | ICD-10-CM

## 2022-05-30 DIAGNOSIS — J4541 Moderate persistent asthma with (acute) exacerbation: Secondary | ICD-10-CM

## 2022-05-30 MED ORDER — CARBAMIDE PEROXIDE 6.5 % OT SOLN: 5.0000 [drp] | Freq: Two times a day (BID) | OTIC | 0 refills | Status: AC

## 2022-05-30 MED ORDER — ALBUTEROL SULFATE HFA 108 (90 BASE) MCG/ACT IN AERS: 2.0000 | INHALATION_SPRAY | Freq: Four times a day (QID) | RESPIRATORY_TRACT | 4 refills | Status: DC | PRN

## 2022-05-30 MED ORDER — MOMETASONE FURO-FORMOTEROL FUM 200-5 MCG/ACT IN AERO: 2.0000 | INHALATION_SPRAY | Freq: Two times a day (BID) | RESPIRATORY_TRACT | 1 refills | Status: DC

## 2022-05-30 NOTE — ED Provider Notes (Signed)
[place]    CSN: [id number] Arrival date & time: 05/30/22  0844     History   Chief Complaint Chief Complaint  Patient presents with   Otalgia    HPI Russell [name] is a 31 y.o. male.  Presents with a 3-week history of ringing in the ears.  Also states pressure and fullness.  Reports a history of ear problems as a child.  Had tubes that have since fallen out.  Denies jaw pain.  No congestion or runny nose.  No dizziness, lightheadedness.  Denies fever/chills, cough, sore throat, chest pain, shortness of breath, abdominal pain, vomiting/diarrhea, rash, weakness, numbness/tingling.   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 Authorizing Provider  carbamide peroxide (DEBROX) 6.5 % OTIC solution Place 5 drops into both ears 2 (two) times daily for 5 days. 05/30/22 06/04/22 Yes Sophia Sperry, Wells Guiles, PA-C  albuterol (VENTOLIN HFA) 108 (90 Base) MCG/ACT inhaler Inhale 2 puffs into the lungs every 6 (six) hours as needed for wheezing or shortness of breath. 05/30/22   Janisha Bueso, Wells Guiles, PA-C  EPINEPHrine (EPIPEN) 0.3 mg/0.3 mL DEVI Inject 0.3 mLs (0.3 mg total) into the muscle once. 07/11/12   Collene Leyden, PA-C  fluticasone (FLONASE) 50 MCG/ACT nasal spray Place 1-2 sprays into both nostrils daily. 10/08/20   Wieters, Hallie C, PA-C  ibuprofen (ADVIL) 800 MG tablet Take 1 tablet (800 mg total) by mouth 3 (three) times daily. 10/08/20   Wieters, Hallie C, PA-C  mometasone-formoterol  (DULERA) 200-5 MCG/ACT AERO Inhale 2 puffs into the lungs 2 (two) times daily. 05/30/22   Aydian Dimmick, Wells Guiles, PA-C  Pseudoeph-Doxylamine-DM-APAP (DAYQUIL/NYQUIL COLD/FLU RELIEF PO) Take by mouth.    [provider]  famotidine (PEPCID) 20 MG tablet Take 1 tablet (20 mg total) by mouth 2 (two) times daily. 08/08/19 10/08/20  Jaynee Eagles, PA-C  Fluticasone-Salmeterol (ADVAIR DISKUS) 250-50 MCG/DOSE AEPB Inhale 1 puff into the lungs 2 (two) times daily. 08/15/19 10/08/20  Chase Picket, MD  montelukast (SINGULAIR) 10 MG tablet Take 1 tablet (10 mg total) by mouth at bedtime. 03/20/20 10/08/20  Faustino Congress, NP    Family History Family History  Problem Relation Age of Onset   Hypertension Mother    Mental illness Mother    Stroke Father    Heart disease Maternal Grandfather    Mental retardation Maternal Uncle     Social History Social History   Tobacco Use   Smoking status: Never   Smokeless tobacco: Current    Types: Chew  Vaping Use   Vaping Use: Every day  Substance Use Topics   Alcohol use: Yes    Comment: occasionally   Drug use: No     Allergies   Penicillins   Review of Systems Review of Systems  HENT:  Positive for ear pain.     Per HPI  Physical Exam Triage Vital Signs ED Triage Vitals [05/30/22 0927]  Enc Vitals Group     BP 127/85     Pulse Rate 80     Resp 18     Temp 98.2 F (36.8 C)     Temp src      SpO2 96 %     Weight      Height      Head Circumference      Peak Flow      Pain Score 0     Pain Loc      Pain Edu?      Excl. in GC?    No data found.  Updated Vital Signs BP 127/85   Pulse 80   Temp 98.2 F (36.8 C)   Resp 18   SpO2 96%     Physical Exam Vitals and nursing note reviewed.  Constitutional:      General: He is not in acute distress. HENT:     Head: Normocephalic and atraumatic.     Jaw: There is normal jaw occlusion. No tenderness or pain on movement.     Right Ear: Tympanic membrane, ear canal and  external ear normal. No drainage. No mastoid tenderness. Tympanic membrane is not perforated or bulging.     Left Ear: Tympanic membrane, ear canal and external ear normal. No drainage. No mastoid tenderness. Tympanic membrane is not perforated or bulging.     Ears:     Comments: Small amount of wax bilat    Nose: Nose normal.     Mouth/Throat:     Mouth: Mucous membranes are moist.     Pharynx: Oropharynx is clear.  Eyes:     Extraocular Movements: Extraocular movements intact.     Conjunctiva/sclera: Conjunctivae normal.     Pupils: Pupils are equal, round, and reactive to light.  Cardiovascular:     Rate and Rhythm: Normal rate and regular rhythm.     Heart sounds: Normal heart sounds.  Pulmonary:     Effort: Pulmonary effort is normal.     Breath sounds: Normal breath sounds.  Abdominal:     General: Abdomen is flat.     Palpations: Abdomen is soft.     Tenderness: There is no abdominal tenderness.  Musculoskeletal:     Cervical back: Normal range of motion.  Lymphadenopathy:     Cervical: No cervical adenopathy.  Neurological:     Mental Status: He is alert and oriented to person, place, and time.     UC Treatments / Results  Labs (all labs ordered are listed, but only abnormal results are displayed) Labs Reviewed - No data to display  EKG  Radiology No results found.  Procedures Procedures (including critical care time)  Medications Ordered in UC Medications - No data to display  Initial Impression / Assessment and Plan / UC Course  I have reviewed the triage vital signs and the nursing notes.  Pertinent labs & imaging results that were available during my care of the patient were reviewed by me and considered in my medical decision making (see chart for details).  Patient ear exam is unremarkable.  He does have a small amount of wax but nothing is obscuring the canal or eardrum.  Offered bilateral ear rinse out but patient did not tolerate procedure.  I  recommend he try the Debrox drops twice a day to see if this helps with fullness.  I have also provided information for [facility] and recommend that he call to schedule an appointment.  Does not have a  primary care so have added him to PCP assistance this. Return precautions discussed. Patient agrees to plan and is discharged in stable condition.  Final Clinical Impressions(s) / UC Diagnoses   Final diagnoses:  Tinnitus of both ears  Wax in ear     Discharge Instructions      I recommend follow up with ENT - their contact information is attached.  You can try the ear drops twice a day for the next 5 days.  I have refilled your asthma medication.    ED Prescriptions     Medication Sig Dispense Auth. Provider   albuterol (VENTOLIN HFA) 108 (90 Base) MCG/ACT inhaler Inhale 2 puffs into the lungs every 6 (six) hours as needed for wheezing or shortness of breath. 8 g Riku Buttery, PA-C   mometasone-formoterol (DULERA) 200-5 MCG/ACT AERO Inhale 2 puffs into the lungs 2 (two) times daily. 13 g Kaila Devries, PA-C   carbamide peroxide (DEBROX) 6.5 % OTIC solution Place 5 drops into both ears 2 (two) times daily for 5 days. 2.5 mL Arrietty Dercole, Wells Guiles, PA-C      PDMP not reviewed this encounter.   Sherlin Sonier, Wells Guiles, PA-C 05/30/22 1049

## 2022-05-30 NOTE — Discharge Instructions (Addendum)
I recommend follow up with ENT - their contact information is attached.  You can try the ear drops twice a day for the next 5 days.  I have refilled your asthma medication.

## 2022-05-30 NOTE — ED Triage Notes (Signed)
Pt is present today with bilateral tinnitus. Pt sx started x3 weeks ago

## 2022-06-02 ENCOUNTER — Encounter (HOSPITAL_COMMUNITY): Payer: Self-pay

## 2022-06-09 ENCOUNTER — Ambulatory Visit: Payer: 59 | Admitting: Physician Assistant

## 2022-06-13 ENCOUNTER — Ambulatory Visit: Payer: 59 | Admitting: Physician Assistant

## 2022-06-22 ENCOUNTER — Encounter: Payer: 59 | Admitting: Physician Assistant

## 2022-06-23 NOTE — Progress Notes (Signed)
No show

## 2022-08-08 ENCOUNTER — Ambulatory Visit: Payer: Self-pay

## 2022-08-08 ENCOUNTER — Emergency Department (HOSPITAL_BASED_OUTPATIENT_CLINIC_OR_DEPARTMENT_OTHER): Payer: Self-pay

## 2022-08-08 ENCOUNTER — Ambulatory Visit
Admission: EM | Admit: 2022-08-08 | Discharge: 2022-08-08 | Disposition: A | Discharge status: Home / Self Care | Payer: 59

## 2022-08-08 ENCOUNTER — Emergency Department (HOSPITAL_BASED_OUTPATIENT_CLINIC_OR_DEPARTMENT_OTHER)
Admission: EM | Admit: 2022-08-08 | Discharge: 2022-08-08 | Disposition: A | Discharge status: Home / Self Care | Payer: Self-pay | Attending: Emergency Medicine | Admitting: Emergency Medicine

## 2022-08-08 ENCOUNTER — Other Ambulatory Visit: Payer: Self-pay

## 2022-08-08 DIAGNOSIS — J45909 Unspecified asthma, uncomplicated: Secondary | ICD-10-CM | POA: Insufficient documentation

## 2022-08-08 DIAGNOSIS — J208 Acute bronchitis due to other specified organisms: Secondary | ICD-10-CM

## 2022-08-08 DIAGNOSIS — N451 Epididymitis: Secondary | ICD-10-CM | POA: Insufficient documentation

## 2022-08-08 DIAGNOSIS — J4541 Moderate persistent asthma with (acute) exacerbation: Secondary | ICD-10-CM

## 2022-08-08 DIAGNOSIS — Z20822 Contact with and (suspected) exposure to covid-19: Secondary | ICD-10-CM | POA: Insufficient documentation

## 2022-08-08 LAB — RESP PANEL BY RT-PCR (FLU A&B, COVID) ARPGX2
Influenza A by PCR: NEGATIVE
Influenza B by PCR: NEGATIVE
SARS Coronavirus 2 by RT PCR: NEGATIVE

## 2022-08-08 LAB — URINALYSIS, ROUTINE W REFLEX MICROSCOPIC
Bilirubin Urine: NEGATIVE
Glucose, UA: NEGATIVE mg/dL
Hgb urine dipstick: NEGATIVE
Ketones, ur: NEGATIVE mg/dL
Leukocytes,Ua: NEGATIVE
Nitrite: NEGATIVE
Protein, ur: NEGATIVE mg/dL
Specific Gravity, Urine: 1.023 (ref 1.005–1.030)
pH: 6.5 (ref 5.0–8.0)

## 2022-08-08 MED ORDER — MOMETASONE FURO-FORMOTEROL FUM 200-5 MCG/ACT IN AERO: 2.0000 | INHALATION_SPRAY | Freq: Two times a day (BID) | RESPIRATORY_TRACT | 1 refills | Status: DC

## 2022-08-08 MED ORDER — LIDOCAINE HCL (PF) 1 % IJ SOLN
2.0000 mL | Freq: Once | INTRAMUSCULAR | Status: AC
  Administered 2022-08-08: 2 mL
  Filled 2022-08-08: qty 5

## 2022-08-08 MED ORDER — CEFTRIAXONE SODIUM 500 MG IJ SOLR
500.0000 mg | Freq: Once | INTRAMUSCULAR | Status: AC
  Administered 2022-08-08: 500 mg via INTRAMUSCULAR
  Filled 2022-08-08: qty 500

## 2022-08-08 MED ORDER — DOXYCYCLINE HYCLATE 100 MG PO CAPS: 100.0000 mg | ORAL_CAPSULE | Freq: Two times a day (BID) | ORAL | 0 refills | Status: DC

## 2022-08-08 MED ORDER — ALBUTEROL SULFATE HFA 108 (90 BASE) MCG/ACT IN AERS
2.0000 | INHALATION_SPRAY | Freq: Four times a day (QID) | RESPIRATORY_TRACT | 4 refills | Status: DC | PRN
Start: 2022-08-08 — End: 2022-09-08

## 2022-08-08 NOTE — Discharge Instructions (Addendum)
We evaluated you today for your testicular pain.  Your ultrasound showed epididymitis which is an inflammation in part of the testicle due to an infection.  The infection can sometimes be caused by sexually transmitted disease.  We have sent testing for this today.  If your test returns positive, your partner will also need treatment, so please let them know of the results.  Return if you develop any severe pain, vomiting, fevers, or any other worsening symptoms.

## 2022-08-08 NOTE — ED Triage Notes (Addendum)
Patient arrives with complaints of worsening left testicle swelling and pain x3 days. No recent injuries. Burning with urination as well Patient states that he went to Urgent Care and was sent here for further evaluation.   Patient also reports head cold for a few days.  Rates pain a 5/10.

## 2022-08-08 NOTE — ED Provider Notes (Signed)
[facility] EMERGENCY DEPT Provider Note  CSN: [id number] Arrival date & time: 08/08/22 1129  Chief Complaint(s) Groin Swelling (Left Testicle ) and Exposure to STD  HPI Russell [name] is a 31 y.o. male with history of asthma presenting to the emergency department with left testicular pain.  Patient reports that pain began 3 days ago.  Denies any injury.  Reports trace burning with urination.  No fevers or chills.  Symptoms are constant.  Reports sexually active with wife.  Reports has gotten chlamydia from wife before.  No urethral discharge.  No painful defecation.  No abdominal pain.  No diarrhea, nausea, fevers, chills.   Past Medical History Past Medical History:  Diagnosis Date   Allergy    Anxiety    Asthma    Obesity    Patient Active Problem List   Diagnosis Date Noted   GAD (generalized anxiety disorder) 12/10/2015   Tinnitus 04/09/2015   Bronchospasm 03/02/2015   Allergy 03/02/2015   Complicated grief 03/02/2015   BMI 36.0-36.9,adult 11/02/2014   Home Medication(s) Prior to Admission medications   Medication Sig Start Date End Date Taking? Authorizing Provider  doxycycline (VIBRAMYCIN) 100 MG capsule Take 1 capsule (100 mg total) by mouth 2 (two) times daily. 08/08/22  Yes Lonell Grandchild, MD  albuterol (VENTOLIN HFA) 108 (90 Base) MCG/ACT inhaler Inhale 2 puffs into the lungs every 6 (six) hours as needed for wheezing or shortness of breath. 05/30/22   Rising, Lurena Joiner, PA-C  EPINEPHrine (EPIPEN) 0.3 mg/0.3 mL DEVI Inject 0.3 mLs (0.3 mg total) into the muscle once. 07/11/12   Nelva Nay, PA-C  fluticasone (FLONASE) 50 MCG/ACT nasal spray Place 1-2 sprays into both nostrils daily. 10/08/20   Wieters, Hallie C, PA-C  ibuprofen (ADVIL) 800 MG tablet Take 1 tablet (800 mg total) by mouth 3 (three) times daily. 10/08/20   Wieters, Hallie C, PA-C  mometasone-formoterol (DULERA) 200-5 MCG/ACT AERO Inhale 2 puffs into the lungs 2 (two) times daily.  05/30/22   Rising, Lurena Joiner, PA-C  Pseudoeph-Doxylamine-DM-APAP (DAYQUIL/NYQUIL COLD/FLU RELIEF PO) Take by mouth.    [provider]  famotidine (PEPCID) 20 MG tablet Take 1 tablet (20 mg total) by mouth 2 (two) times daily. 08/08/19 10/08/20  Wallis Bamberg, PA-C  Fluticasone-Salmeterol (ADVAIR DISKUS) 250-50 MCG/DOSE AEPB Inhale 1 puff into the lungs 2 (two) times daily. 08/15/19 10/08/20  Merrilee Jansky, MD  montelukast (SINGULAIR) 10 MG tablet Take 1 tablet (10 mg total) by mouth at bedtime. 03/20/20 10/08/20  Moshe Cipro, NP                                                                                                                                    Past Surgical History Past Surgical History:  Procedure Laterality Date   TONSILLECTOMY     TYMPANOSTOMY TUBE PLACEMENT     Family History Family History  Problem Relation Age of Onset   Hypertension  Mother    Mental illness Mother    Stroke Father    Heart disease Maternal Grandfather    Mental retardation Maternal Uncle     Social History Social History   Tobacco Use   Smoking status: Never   Smokeless tobacco: Current    Types: Chew  Vaping Use   Vaping Use: Every day  Substance Use Topics   Alcohol use: Yes    Comment: occasionally   Drug use: No   Allergies Penicillins  Review of Systems Review of Systems  All other systems reviewed and are negative.   Physical Exam Vital Signs  I have reviewed the triage vital signs BP 122/86 (BP Location: Left Arm)   Pulse 92   Temp 98.3 F (36.8 C)   Resp 18   Ht 6\' 5"  (1.956 m)   Wt 136.1 kg   SpO2 98%   BMI 35.57 kg/m  Physical Exam Vitals and nursing note reviewed.  Constitutional:      General: He is not in acute distress.    Appearance: Normal appearance.  HENT:     Mouth/Throat:     Mouth: Mucous membranes are moist.  Cardiovascular:     Rate and Rhythm: Normal rate and regular rhythm.  Pulmonary:     Effort: Pulmonary effort is normal.  No respiratory distress.     Breath sounds: Normal breath sounds.  Abdominal:     General: Abdomen is flat.     Palpations: Abdomen is soft.     Tenderness: There is no abdominal tenderness.  Genitourinary:    Comments: Chaperoned by respiratory therapist Caryl Pina, mild left testicular tenderness to palpation with mild swelling, Skin:    General: Skin is warm and dry.     Capillary Refill: Capillary refill takes less than 2 seconds.  Neurological:     Mental Status: He is alert and oriented to person, place, and time. Mental status is at baseline.  Psychiatric:        Mood and Affect: Mood normal.        Behavior: Behavior normal.     ED Results and Treatments Labs (all labs ordered are listed, but only abnormal results are displayed) Labs Reviewed  RESP PANEL BY RT-PCR (FLU A&B, COVID) ARPGX2  URINALYSIS, ROUTINE W REFLEX MICROSCOPIC  GC/CHLAMYDIA PROBE AMP ([place]) NOT AT [facility]                                                                                                                          Radiology US SCROTUM W/DOPPLER  Result Date: 08/08/2022 CLINICAL DATA:  Left scrotal pain and swelling for 3 days. No known injury. EXAM: SCROTAL ULTRASOUND DOPPLER ULTRASOUND OF THE TESTICLES TECHNIQUE: Complete ultrasound examination of the testicles, epididymis, and other scrotal structures was performed. Color and spectral Doppler ultrasound were also utilized to evaluate blood flow to the testicles. COMPARISON:  None Available. FINDINGS: Right testicle Measurements: 4.9 x 2.3 x 3.9 cm. No mass or microlithiasis  visualized. Normal blood flow with color Doppler. Left testicle Measurements: 4.6 x 2.6 x 3.2 cm. No mass or microlithiasis visualized. Normal blood flow with color Doppler. Right epididymis:  Normal in size and appearance. Left epididymis: Normal in size and appearance. Mildly increased blood flow in the left epididymal tail. Hydrocele:  Small left-sided hydrocele. Varicocele:   Small right-sided varicocele. Pulsed Doppler interrogation of both testes demonstrates normal low resistance arterial and venous waveforms bilaterally. IMPRESSION: 1. Both testes appear normal.  No evidence of testicular torsion. 2. Left epididymal hypervascularity and small hydrocele suspicious for epididymitis. Correlate clinically. 3. Small right-sided varicocele. Electronically Signed   By: Carey Bullocks M.D.   On: 08/08/2022 15:02    Pertinent labs & imaging results that were available during my care of the patient were reviewed by me and considered in my medical decision making (see MDM for details).  Medications Ordered in ED Medications  cefTRIAXone (ROCEPHIN) injection 500 mg (has no administration in time range)  lidocaine (PF) (XYLOCAINE) 1 % injection 2 mL (has no administration in time range)                                                                                                                                     Procedures Procedures  (including critical care time)  Medical Decision Making / ED Course   MDM:  31 year old male presenting to the emergency department with testicular pain.  Physical exam with mild swelling to the left testicle with mild tenderness.  Ultrasound demonstrates sign of epididymitis in the left testicle.  Urinalysis bland, suspect STI given age and history.  No sign of torsion on ultrasound.  No traumatic findings on ultrasound and no history of trauma.  We will treat for gonorrhea and chlamydia empirically.  Send STI testing.  Discussed with patient need for his partner to also be tested and treated if he returns positive. Will discharge patient to home. All questions answered. Patient comfortable with plan of discharge. Return precautions discussed with patient and specified on the after visit summary.       Additional history obtained: -External records from outside source obtained and reviewed including: Chart review including  previous notes, labs, imaging, consultation notes   Lab Tests: -I ordered, reviewed, and interpreted labs.   The pertinent results include:   Labs Reviewed  RESP PANEL BY RT-PCR (FLU A&B, COVID) ARPGX2  URINALYSIS, ROUTINE W REFLEX MICROSCOPIC  GC/CHLAMYDIA PROBE AMP ([place]) NOT AT [facility]      EKG   EKG Interpretation  Date/Time:    Ventricular Rate:    PR Interval:    QRS Duration:   QT Interval:    QTC Calculation:   R Axis:     Text Interpretation:           Imaging Studies ordered: I ordered imaging studies including US testicle I independently visualized and interpreted imaging. I agree with the radiologist  interpretation   Medicines ordered and prescription drug management: Meds ordered this encounter  Medications   cefTRIAXone (ROCEPHIN) injection 500 mg    Order Specific Question:   Antibiotic Indication:    Answer:   STD   lidocaine (PF) (XYLOCAINE) 1 % injection 2 mL   doxycycline (VIBRAMYCIN) 100 MG capsule    Sig: Take 1 capsule (100 mg total) by mouth 2 (two) times daily.    Dispense:  20 capsule    Refill:  0    -I have reviewed the patients home medicines and have made adjustments as needed  Social Determinants of Health:  Factors impacting patients care include: sexual activity history   Reevaluation: After the interventions noted above, I reevaluated the patient and found that they have :improved  Co morbidities that complicate the patient evaluation  Past Medical History:  Diagnosis Date   Allergy    Anxiety    Asthma    Obesity       Dispostion: Discharge     Final Clinical Impression(s) / ED Diagnoses Final diagnoses:  Epididymitis     This chart was dictated using voice recognition software.  Despite best efforts to proofread,  errors can occur which can change the documentation meaning.    Lonell Grandchild, MD 08/08/22 [phone]

## 2022-08-09 LAB — GC/CHLAMYDIA PROBE AMP (~~LOC~~) NOT AT ARMC
Chlamydia: NEGATIVE
Comment: NEGATIVE
Comment: NORMAL
Neisseria Gonorrhea: NEGATIVE

## 2022-08-23 ENCOUNTER — Encounter (HOSPITAL_COMMUNITY): Payer: Self-pay

## 2022-08-23 ENCOUNTER — Ambulatory Visit (HOSPITAL_COMMUNITY)
Admission: RE | Admit: 2022-08-23 | Discharge: 2022-08-23 | Disposition: A | Discharge status: Home / Self Care | Payer: Self-pay | Source: Ambulatory Visit | Attending: Family Medicine | Admitting: Family Medicine

## 2022-08-23 VITALS — BP 133/98 | HR 93 | Temp 98.4°F | Resp 18

## 2022-08-23 DIAGNOSIS — R600 Localized edema: Secondary | ICD-10-CM

## 2022-08-23 MED ORDER — FUROSEMIDE 20 MG PO TABS: ORAL_TABLET | ORAL | 0 refills | Status: DC

## 2022-08-23 NOTE — Discharge Instructions (Signed)
Swelling happens when fluid collects in small spaces around tissues and organs inside the body. Another word for swelling is "edema." Some common parts of the body where people can have swelling are the lower legs or hands. This typically is worse in the areas of the body that are closest to the ground (because of gravity)  Symptoms of swelling can include puffiness of the skin, which can cause the skin to look stretched and shiny. This often occurs with swelling in the lower legs and can be worse after you sit or stand for a long time.  Treatment of edema includes several components: treatment of the underlying cause (if possible), reducing the amount of salt (sodium) in your diet, and, in many cases, use of a medication called a diuretic to eliminate excess fluid. Using compression stockings and elevating the legs may also be recommended.   

## 2022-08-23 NOTE — ED Triage Notes (Signed)
Pt states stung by a bee on Saturday to RLE. States since Sunday having redness, heat, and tightness to RLE. Using ice, heat, and benadryl with no relief.

## 2022-08-24 NOTE — ED Provider Notes (Signed)
[facility]ARE [facility]ENTER   [id number] 08/23/22 Arrival Time: 1801  ASSESSMENT & PLAN:  1. Edema of lower extremity    No indication for plain imaging. Suspect leg swelling related to insect sting. No signs of skin infection. Very low suspicion for DVT. Elevated leg as able. Activities as tolerated.  Discharge Medication List as of 08/23/2022  7:27 PM     START taking these medications   Details  furosemide (LASIX) 20 MG tablet Take 1-2 tablets every morning for up to one week., Normal       Recommend:  Follow-up Information     [facility].   Specialty: Urgent Care Contact information: [address] [place] [address] [phone]                Reviewed expectations re: course of current medical issues. Questions answered. Outlined signs and symptoms indicating need for more acute intervention. Patient verbalized understanding. After Visit Summary given.  SUBJECTIVE: History from: patient. Russell [name] is a 31 y.o. male who reports RLE swelling s/p insect sting a few d ago. Swelling initially painful but better today. Is ambulatory. Afebrile. No extremity sensation changes or weakness. No tx PTA.  Past Surgical History:  Procedure Laterality Date   TONSILLECTOMY     TYMPANOSTOMY TUBE PLACEMENT        OBJECTIVE:  Vitals:   08/23/22 1847  BP: (!) 133/98  Pulse: 93  Resp: 18  Temp: 98.4 F (36.9 C)  SpO2: 98%    General appearance: alert; no distress HEENT: [place]; AT Neck: supple with FROM Resp: unlabored respirations Extremities: RLE: warm with well perfused appearance; with mild 1+ pitting edema; bruising: none; knee and ankle ROM: normal CV: brisk extremity capillary refill of RLE; 2+ DP pulse of RLE. Skin: warm and dry; no visible rashes Neurologic: gait normal; normal sensation and strength of RLE Psychological: alert and cooperative; normal mood and affect   Allergies  Allergen Reactions    Penicillins     Unknown    Past Medical History:  Diagnosis Date   Allergy    Anxiety    Asthma    Obesity    Social History   Socioeconomic History   Marital status: Significant Other    Spouse name: Not on file   Number of children: Not on file   Years of education: Not on file   Highest education level: Not on file  Occupational History   Not on file  Tobacco Use   Smoking status: Never   Smokeless tobacco: Current    Types: Chew  Vaping Use   Vaping Use: Every day  Substance and Sexual Activity   Alcohol use: Yes    Comment: occasionally   Drug use: No   Sexual activity: Not on file  Other Topics Concern   Not on file  Social History Narrative   Not on file   Social Determinants of Health   Financial Resource Strain: Not on file  Food Insecurity: Not on file  Transportation Needs: Not on file  Physical Activity: Not on file  Stress: Not on file  Social Connections: Not on file   Family History  Problem Relation Age of Onset   Hypertension Mother    Mental illness Mother    Stroke Father    Heart disease Maternal Grandfather    Mental retardation Maternal Uncle    Past Surgical History:  Procedure Laterality Date   TONSILLECTOMY     TYMPANOSTOMY  TUBE PLACEMENT         Mardella Layman, MD 08/24/22 [phone]

## 2022-09-08 ENCOUNTER — Ambulatory Visit
Admission: EM | Admit: 2022-09-08 | Discharge: 2022-09-08 | Disposition: A | Discharge status: Home / Self Care | Payer: Self-pay | Attending: Nurse Practitioner | Admitting: Nurse Practitioner

## 2022-09-08 ENCOUNTER — Inpatient Hospital Stay
Admission: RE | Admit: 2022-09-08 | Discharge: 2022-09-08 | Disposition: A | Discharge status: Home / Self Care | Payer: Self-pay | Source: Ambulatory Visit

## 2022-09-08 ENCOUNTER — Encounter: Payer: Self-pay | Admitting: Emergency Medicine

## 2022-09-08 DIAGNOSIS — B353 Tinea pedis: Secondary | ICD-10-CM | POA: Insufficient documentation

## 2022-09-08 DIAGNOSIS — B9789 Other viral agents as the cause of diseases classified elsewhere: Secondary | ICD-10-CM | POA: Insufficient documentation

## 2022-09-08 DIAGNOSIS — J019 Acute sinusitis, unspecified: Secondary | ICD-10-CM | POA: Insufficient documentation

## 2022-09-08 DIAGNOSIS — J4541 Moderate persistent asthma with (acute) exacerbation: Secondary | ICD-10-CM | POA: Insufficient documentation

## 2022-09-08 DIAGNOSIS — B079 Viral wart, unspecified: Secondary | ICD-10-CM | POA: Insufficient documentation

## 2022-09-08 DIAGNOSIS — J208 Acute bronchitis due to other specified organisms: Secondary | ICD-10-CM | POA: Insufficient documentation

## 2022-09-08 DIAGNOSIS — Z1152 Encounter for screening for COVID-19: Secondary | ICD-10-CM | POA: Insufficient documentation

## 2022-09-08 MED ORDER — OXICONAZOLE NITRATE 1 % EX CREA: 1.0000 "application " | TOPICAL_CREAM | Freq: Two times a day (BID) | CUTANEOUS | 0 refills | Status: DC

## 2022-09-08 MED ORDER — BENZONATATE 100 MG PO CAPS: 100.0000 mg | ORAL_CAPSULE | Freq: Three times a day (TID) | ORAL | 0 refills | Status: DC | PRN

## 2022-09-08 MED ORDER — ALBUTEROL SULFATE HFA 108 (90 BASE) MCG/ACT IN AERS: 2.0000 | INHALATION_SPRAY | Freq: Four times a day (QID) | RESPIRATORY_TRACT | 0 refills | Status: DC | PRN

## 2022-09-08 NOTE — ED Triage Notes (Signed)
Body aches, head congestion since Tuesday.  2 at home covid tests were negative.  Has been taking sudafed with little relief.

## 2022-09-08 NOTE — ED Triage Notes (Signed)
Also c/o rash on left foot.  States bottom of foot itches and stays dry.

## 2022-09-08 NOTE — Discharge Instructions (Addendum)
Your symptoms and exam findings are most consistent with a viral upper respiratory infection. These usually run their course in about 10 days.  If your symptoms last longer than 10 days without improvement, please follow up with your primary care provider.  If your symptoms, worsen, please go to the Emergency Room.    We have tested you today COVID-19.  You will see the results in Mychart and we will call you with positive results.    Please stay home and isolate until you are aware of the results.    Some things that can make you feel better are: - Increased rest - Increasing fluid with water/sugar free electrolytes - Acetaminophen and ibuprofen as needed for fever/pain.  - Salt water gargling, chloraseptic spray and throat lozenges - OTC guaifenesin (Mucinex).  - Saline sinus flushes or a neti pot.  - Humidifying the air.  Compound W for warts

## 2022-09-08 NOTE — ED Provider Notes (Signed)
[facility]    CSN: [id number] Arrival date & time: 09/08/22  0954      History   Chief Complaint Chief Complaint  Patient presents with   Headache    Sick visit - Entered by patient    HPI Russell [name] is a 31 y.o. male.   Patient presents with 2 days of fever, aches, chills, shortness of breath, wheezing, nasal congestion, nose, nasal drainage, sneezing, sore throat, sinus pressure and headache, bilateral ear pressure, and fatigue.  He denies cough, chest pain or tightness, tooth pain, abdominal pain, nausea/vomiting, diarrhea, decreased appetite, and new rash.  Reports his wife and children are also currently sick right now, all of them have been tested for COVID-19 and have been negative.  He has been taking Sudafed and Tylenol which helps with his symptoms temporarily.  He has a history of asthma and has been using albuterol inhaler for wheezing and shortness of breath which does help and he is requesting a refill of the albuterol inhaler today.  Patient also has a dry, crusty rash to his left foot that has been present for the past year.  Reports at times, the rash will get really moist and intensely itchy, then gets dry and itchy.  Has not fully gone away in about a year.  He reports a history of athlete's foot.  He reports his skin is currently flaking, however denies any redness, oozing, scaling or blisters.  There is no pain or wounds associated with the rash.  He denies any recent change in detergents, soaps, personal care products.  Patient also has questions about hard bumps on his fingers and wonders what he can use for them to help them go away.  Reports he notices about a year ago, they are painful when he bumps them on things, otherwise not red, itchy, draining, or bothersome.  He wonders if there may be warts.     Past Medical History:  Diagnosis Date   Allergy    Anxiety    Asthma    Obesity     Patient Active Problem List   Diagnosis Date Noted    GAD (generalized anxiety disorder) 12/10/2015   Tinnitus 04/09/2015   Bronchospasm 03/02/2015   Allergy 30/86/5784   Complicated grief 69/62/9528   BMI 36.0-36.9,adult 11/02/2014    Past Surgical History:  Procedure Laterality Date   TONSILLECTOMY     TYMPANOSTOMY TUBE PLACEMENT         Home Medications    Prior to Admission medications   Medication Sig Start Date End Date Taking? Authorizing Provider  benzonatate (TESSALON) 100 MG capsule Take 1 capsule (100 mg total) by mouth 3 (three) times daily as needed for cough. Do not take with alcohol or while driving or operating heavy machinery 09/08/22  Yes Noemi Chapel A, NP  oxiconazole (OXISTAT) 1 % CREA Apply 1 application  topically 2 (two) times daily. 09/08/22  Yes Eulogio Bear, NP  albuterol (VENTOLIN HFA) 108 (90 Base) MCG/ACT inhaler Inhale 2 puffs into the lungs every 6 (six) hours as needed for wheezing or shortness of breath. 09/08/22   Eulogio Bear, NP  EPINEPHrine (EPIPEN) 0.3 mg/0.3 mL DEVI Inject 0.3 mLs (0.3 mg total) into the muscle once. 07/11/12   Collene Leyden, PA-C  fluticasone (FLONASE) 50 MCG/ACT nasal spray Place 1-2 sprays into both nostrils daily. 10/08/20   Wieters, Hallie C, PA-C  ibuprofen (ADVIL) 800 MG tablet Take 1 tablet (800 mg total) by mouth  3 (three) times daily. 10/08/20   Wieters, Hallie C, PA-C  mometasone-formoterol (DULERA) 200-5 MCG/ACT AERO Inhale 2 puffs into the lungs 2 (two) times daily. 08/08/22   Lonell Grandchild, MD  Pseudoeph-Doxylamine-DM-APAP (DAYQUIL/NYQUIL COLD/FLU RELIEF PO) Take by mouth.    [provider]  famotidine (PEPCID) 20 MG tablet Take 1 tablet (20 mg total) by mouth 2 (two) times daily. 08/08/19 10/08/20  Wallis Bamberg, PA-C  Fluticasone-Salmeterol (ADVAIR DISKUS) 250-50 MCG/DOSE AEPB Inhale 1 puff into the lungs 2 (two) times daily. 08/15/19 10/08/20  Merrilee Jansky, MD  montelukast (SINGULAIR) 10 MG tablet Take 1 tablet (10 mg total) by  mouth at bedtime. 03/20/20 10/08/20  Moshe Cipro, NP    Family History Family History  Problem Relation Age of Onset   Hypertension Mother    Mental illness Mother    Stroke Father    Heart disease Maternal Grandfather    Mental retardation Maternal Uncle     Social History Social History   Tobacco Use   Smoking status: Never   Smokeless tobacco: Current    Types: Chew  Vaping Use   Vaping Use: Every day  Substance Use Topics   Alcohol use: Yes    Comment: occasionally   Drug use: No     Allergies   Penicillins   Review of Systems Review of Systems Per HPI  Physical Exam Triage Vital Signs ED Triage Vitals [09/08/22 1012]  Enc Vitals Group     BP (!) 145/90     Pulse Rate 87     Resp 18     Temp (!) 97.5 F (36.4 C)     Temp Source Oral     SpO2 96 %     Weight      Height      Head Circumference      Peak Flow      Pain Score 5     Pain Loc      Pain Edu?      Excl. in GC?    No data found.  Updated Vital Signs BP (!) 145/90 (BP Location: Right Arm)   Pulse 87   Temp (!) 97.5 F (36.4 C) (Oral)   Resp 18   SpO2 96%   Visual Acuity Right Eye Distance:   Left Eye Distance:   Bilateral Distance:    Right Eye Near:   Left Eye Near:    Bilateral Near:     Physical Exam Vitals and nursing note reviewed.  Constitutional:      General: He is not in acute distress.    Appearance: Normal appearance. He is not ill-appearing or toxic-appearing.  HENT:     Head: Normocephalic and atraumatic.     Right Ear: Tympanic membrane, ear canal and external ear normal.     Left Ear: Tympanic membrane, ear canal and external ear normal.     Nose: Congestion and rhinorrhea present.     Mouth/Throat:     Mouth: Mucous membranes are moist.     Pharynx: Oropharynx is clear. Posterior oropharyngeal erythema present. No oropharyngeal exudate.  Eyes:     General: No scleral icterus.    Extraocular Movements: Extraocular movements intact.   Cardiovascular:     Rate and Rhythm: Normal rate and regular rhythm.     Pulses:          Dorsalis pedis pulses are 1+ on the left side.  Pulmonary:     Effort: Pulmonary effort is normal. No respiratory  distress.     Breath sounds: Normal breath sounds. No wheezing, rhonchi or rales.  Abdominal:     General: Abdomen is flat. Bowel sounds are normal. There is no distension.     Palpations: Abdomen is soft.     Tenderness: There is no abdominal tenderness.  Musculoskeletal:     Cervical back: Normal range of motion and neck supple.     Comments: Multiple raised, flesh-colored, rough flaking masses to phalanges consistent with viral wart.  Feet:     Comments: Flaky, white skin noted to sole of left foot.  No moisture associated skin breakdown, open wounds, redness, or odor noted. Lymphadenopathy:     Cervical: No cervical adenopathy.  Skin:    General: Skin is warm and dry.     Capillary Refill: Capillary refill takes less than 2 seconds.     Coloration: Skin is not jaundiced or pale.     Findings: No erythema or rash.  Neurological:     Mental Status: He is alert and oriented to person, place, and time.  Psychiatric:        Behavior: Behavior is cooperative.      UC Treatments / Results  Labs (all labs ordered are listed, but only abnormal results are displayed) Labs Reviewed  SARS CORONAVIRUS 2 (TAT 6-24 HRS)    EKG   Radiology No results found.  Procedures Procedures (including critical care time)  Medications Ordered in UC Medications - No data to display  Initial Impression / Assessment and Plan / UC Course  I have reviewed the triage vital signs and the nursing notes.  Pertinent labs & imaging results that were available during my care of the patient were reviewed by me and considered in my medical decision making (see chart for details).    Patient is well-appearing, afebrile, not tachycardic, not tachypneic, oxygenating well on room air.  He is slightly  hypertensive today, likely secondary to recent Sudafed use.  Recommended discontinuing Sudafed use to prevent rebound nasal congestion.  COVID-19 testing obtained.  Supportive care discussed.  Tessalon Perles sent to pharmacy to suppress cough if this develops as well as refill of albuterol inhaler.  ER and return precautions discussed.  Note given for work.  Regarding rash to left foot, suspect tinea pedis.  Treat with oxiconazole.  Return precautions discussed and supportive care discussed.  Recommended establishing care with primary care provider and patient is in agreement, initiated primary care assistance.  Instructed to use of over-the-counter Compound W for viral warts and hands.  Follow-up with dermatologist with no improvement. Final Clinical Impressions(s) / UC Diagnoses   Final diagnoses:  Encounter for screening for COVID-19  Acute viral sinusitis  Tinea pedis of left foot  Viral warts, unspecified type     Discharge Instructions      Your symptoms and exam findings are most consistent with a viral upper respiratory infection. These usually run their course in about 10 days.  If your symptoms last longer than 10 days without improvement, please follow up with your primary care provider.  If your symptoms, worsen, please go to the Emergency Room.    We have tested you today COVID-19.  You will see the results in Mychart and we will call you with positive results.    Please stay home and isolate until you are aware of the results.    Some things that can make you feel better are: - Increased rest - Increasing fluid with water/sugar free electrolytes - Acetaminophen  and ibuprofen as needed for fever/pain.  - Salt water gargling, chloraseptic spray and throat lozenges - OTC guaifenesin (Mucinex).  - Saline sinus flushes or a neti pot.  - Humidifying the air.  Compound W for warts     ED Prescriptions     Medication Sig Dispense Auth. Provider   benzonatate (TESSALON)  100 MG capsule Take 1 capsule (100 mg total) by mouth 3 (three) times daily as needed for cough. Do not take with alcohol or while driving or operating heavy machinery 21 capsule Cathlean Marseilles A, NP   albuterol (VENTOLIN HFA) 108 (90 Base) MCG/ACT inhaler Inhale 2 puffs into the lungs every 6 (six) hours as needed for wheezing or shortness of breath. 18 g Cathlean Marseilles A, NP   oxiconazole (OXISTAT) 1 % CREA Apply 1 application  topically 2 (two) times daily. 30 g Valentino Nose, NP      PDMP not reviewed this encounter.   Valentino Nose, NP 09/08/22 1730

## 2022-09-09 ENCOUNTER — Other Ambulatory Visit: Payer: Self-pay | Admitting: Nurse Practitioner

## 2022-09-09 LAB — SARS CORONAVIRUS 2 (TAT 6-24 HRS): SARS Coronavirus 2: NEGATIVE

## 2022-09-20 ENCOUNTER — Encounter (HOSPITAL_COMMUNITY): Payer: Self-pay

## 2022-09-28 ENCOUNTER — Telehealth: Payer: Self-pay | Admitting: Physician Assistant

## 2022-09-28 DIAGNOSIS — J069 Acute upper respiratory infection, unspecified: Secondary | ICD-10-CM

## 2022-09-28 MED ORDER — ALBUTEROL SULFATE HFA 108 (90 BASE) MCG/ACT IN AERS: 2.0000 | INHALATION_SPRAY | Freq: Four times a day (QID) | RESPIRATORY_TRACT | 0 refills | Status: DC | PRN

## 2022-09-28 MED ORDER — GUAIFENESIN ER 600 MG PO TB12: 600.0000 mg | ORAL_TABLET | Freq: Two times a day (BID) | ORAL | 0 refills | Status: DC

## 2022-09-28 MED ORDER — BENZONATATE 100 MG PO CAPS: 100.0000 mg | ORAL_CAPSULE | Freq: Three times a day (TID) | ORAL | 0 refills | Status: AC

## 2022-09-28 NOTE — Progress Notes (Signed)
Virtual Visit Consent   Russell [name], you are scheduled for a virtual visit with a [facility] provider today. Just as with appointments in the office, your consent must be obtained to participate. Your consent will be active for this visit and any virtual visit you may have with one of our providers in the next 365 days. If you have a MyChart account, a copy of this consent can be sent to you electronically.  As this is a virtual visit, video technology does not allow for your provider to perform a traditional examination. This may limit your provider's ability to fully assess your condition. If your provider identifies any concerns that need to be evaluated in person or the need to arrange testing (such as labs, EKG, etc.), we will make arrangements to do so. Although advances in technology are sophisticated, we cannot ensure that it will always work on either your end or our end. If the connection with a video visit is poor, the visit may have to be switched to a telephone visit. With either a video or telephone visit, we are not always able to ensure that we have a secure connection.  By engaging in this virtual visit, you consent to the provision of healthcare and authorize for your insurance to be billed (if applicable) for the services provided during this visit. Depending on your insurance coverage, you may receive a charge related to this service.  I need to obtain your verbal consent now. Are you willing to proceed with your visit today? Russell [name] has provided verbal consent on 09/28/2022 for a virtual visit (video or telephone). Karrie Meres, [place]  Date: 09/28/2022 2:01 PM  Virtual Visit via Video Note   I, Andera Cranmer S Eugune Sine, connected with  Russell [name]  ([id number], February 18, 1991) on 09/28/22 at  1:45 PM EDT by a video-enabled telemedicine application and verified that I am speaking with the correct person using two identifiers.  Location: Patient: Virtual Visit Location  Patient: Home Provider: Virtual Visit Location Provider: Home Office   I discussed the limitations of evaluation and management by telemedicine and the availability of in person appointments. The patient expressed understanding and agreed to proceed.    History of Present Illness: Russell [name] is a 31 y.o. who identifies as a male who was assigned male at birth, and is being seen today for uri sxs. States his sxs started with sneezing and have progressed to rhinorrhea, congestion, cough, body aches, and fevers. He report some wheezing and states he ran out of his regular inhaler. Denies sore throat.  Pt states his son has been ill with URI sxs as well  HPI: HPI  Problems:  Patient Active Problem List   Diagnosis Date Noted   GAD (generalized anxiety disorder) 12/10/2015   Tinnitus 04/09/2015   Bronchospasm 03/02/2015   Allergy 03/02/2015   Complicated grief 03/02/2015   BMI 36.0-36.9,adult 11/02/2014    Allergies:  Allergies  Allergen Reactions   Penicillins     Unknown   Medications:  Current Outpatient Medications:    albuterol (VENTOLIN HFA) 108 (90 Base) MCG/ACT inhaler, Inhale 2 puffs into the lungs every 6 (six) hours as needed for wheezing or shortness of breath., Disp: 8 g, Rfl: 0   benzonatate (TESSALON) 100 MG capsule, Take 1 capsule (100 mg total) by mouth every 8 (eight) hours for 5 days., Disp: 15 capsule, Rfl: 0   guaiFENesin (MUCINEX) 600 MG 12 hr tablet, Take 1 tablet (600 mg total)  by mouth 2 (two) times daily., Disp: 6 tablet, Rfl: 0   albuterol (VENTOLIN HFA) 108 (90 Base) MCG/ACT inhaler, Inhale 2 puffs into the lungs every 6 (six) hours as needed for wheezing or shortness of breath., Disp: 18 g, Rfl: 0   benzonatate (TESSALON) 100 MG capsule, Take 1 capsule (100 mg total) by mouth 3 (three) times daily as needed for cough. Do not take with alcohol or while driving or operating heavy machinery, Disp: 21 capsule, Rfl: 0   EPINEPHrine (EPIPEN) 0.3 mg/0.3 mL DEVI,  Inject 0.3 mLs (0.3 mg total) into the muscle once., Disp: 1 Device, Rfl: 0   fluticasone (FLONASE) 50 MCG/ACT nasal spray, Place 1-2 sprays into both nostrils daily., Disp: 16 g, Rfl: 0   ibuprofen (ADVIL) 800 MG tablet, Take 1 tablet (800 mg total) by mouth 3 (three) times daily., Disp: 21 tablet, Rfl: 0   mometasone-formoterol (DULERA) 200-5 MCG/ACT AERO, Inhale 2 puffs into the lungs 2 (two) times daily., Disp: 13 g, Rfl: 1   oxiconazole (OXISTAT) 1 % CREA, Apply 1 application  topically 2 (two) times daily., Disp: 30 g, Rfl: 0   Pseudoeph-Doxylamine-DM-APAP (DAYQUIL/NYQUIL COLD/FLU RELIEF PO), Take by mouth., Disp: , Rfl:   Observations/Objective: Patient is well-developed, well-nourished in no acute distress.  Resting comfortably  at home.  Head is normocephalic, atraumatic.  No labored breathing.  Speech is clear and coherent with logical content.  Patient is alert and oriented at baseline.    Assessment and Plan: 1. Viral upper respiratory tract infection  URI sxs x2-3 days. Reports sick contacts. Rx for tessalon, mucinex and albuterol inhaler sent.  Follow Up Instructions: I discussed the assessment and treatment plan with the patient. The patient was provided an opportunity to ask questions and all were answered. The patient agreed with the plan and demonstrated an understanding of the instructions.  A copy of instructions were sent to the patient via MyChart unless otherwise noted below.   The patient was advised to call back or seek an in-person evaluation if the symptoms worsen or if the condition fails to improve as anticipated.  Time:  I spent 10 minutes with the patient via telehealth technology discussing the above problems/concerns.    Emric Kowalewski Gilman Schmidt, PA-C

## 2022-12-21 IMAGING — DX DG CHEST 2V
4 series · 4 of 4 positions shown · non-contrast
Comparison: 04/26/2020

CLINICAL DATA: Short of breath, cough, nasal congestion

EXAM:
CHEST - 2 VIEW

[chest pa (1 of 2)]
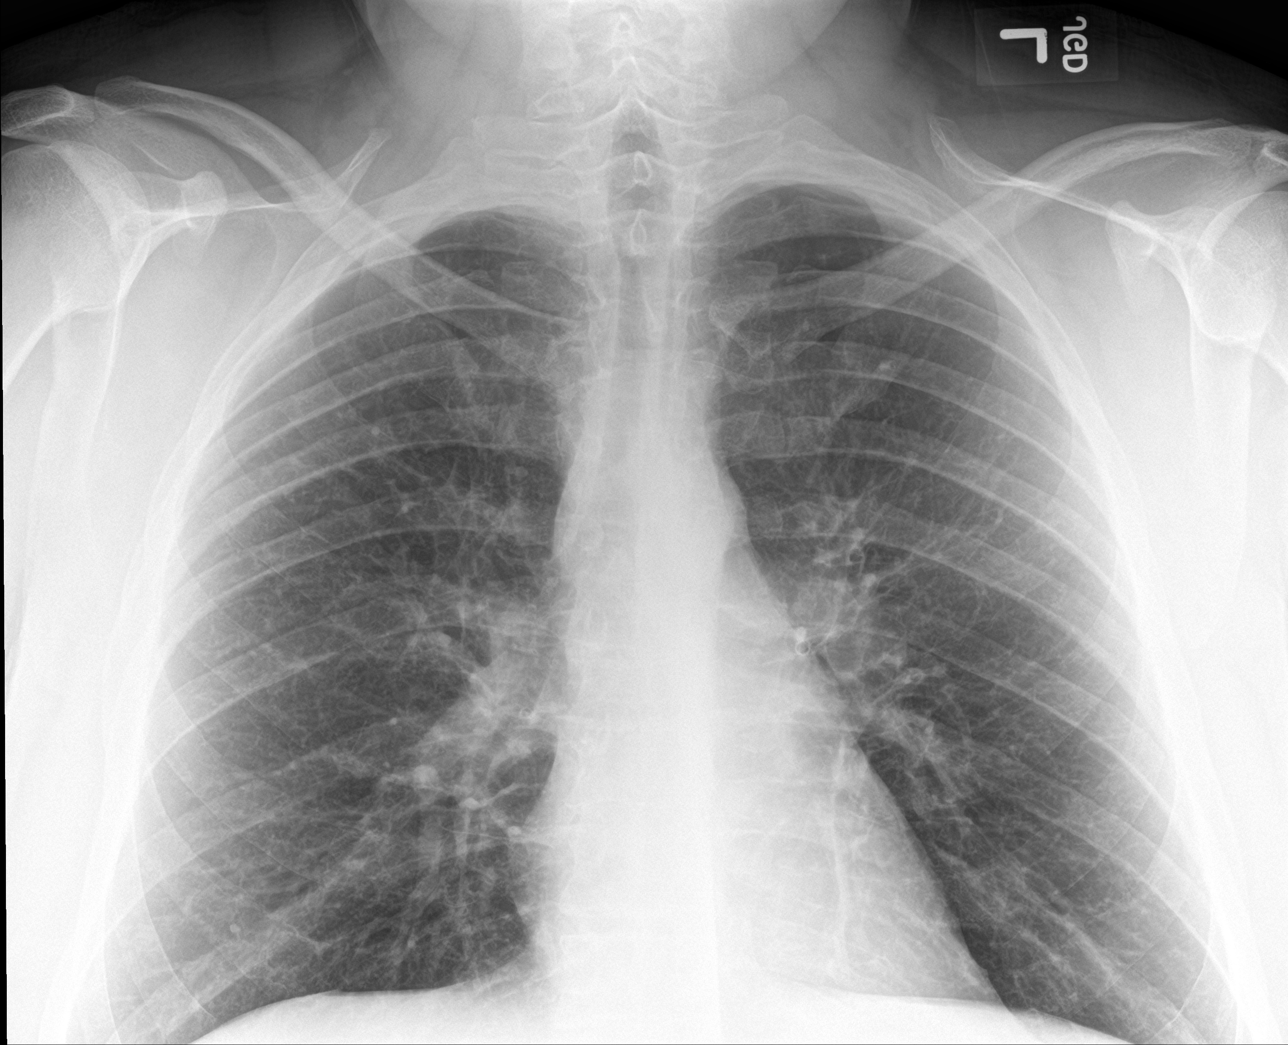

[chest lat (1 of 2)]
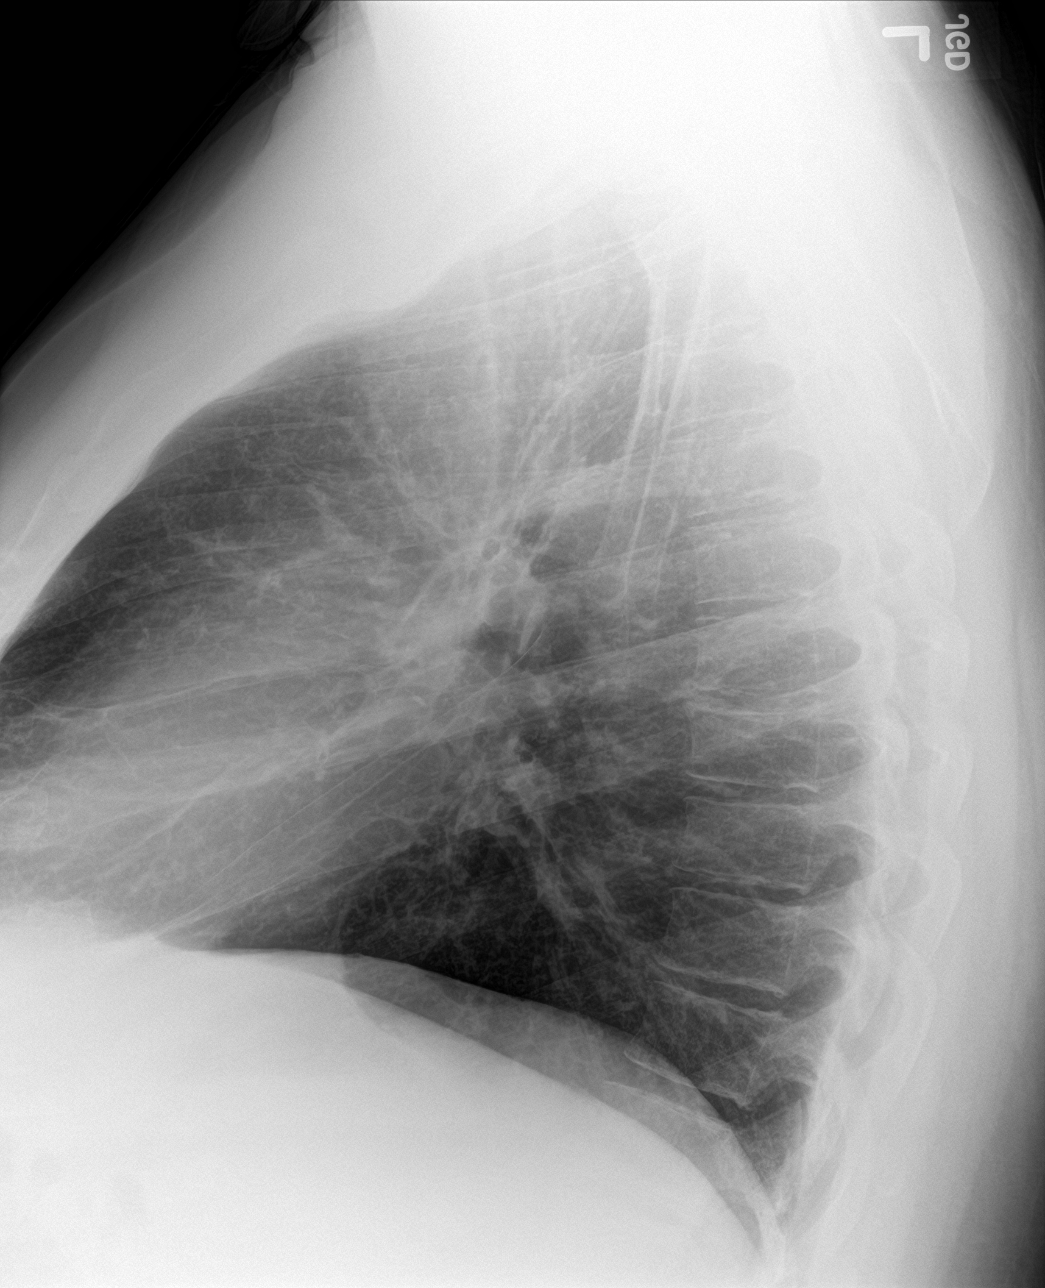

[chest lat (2 of 2)]
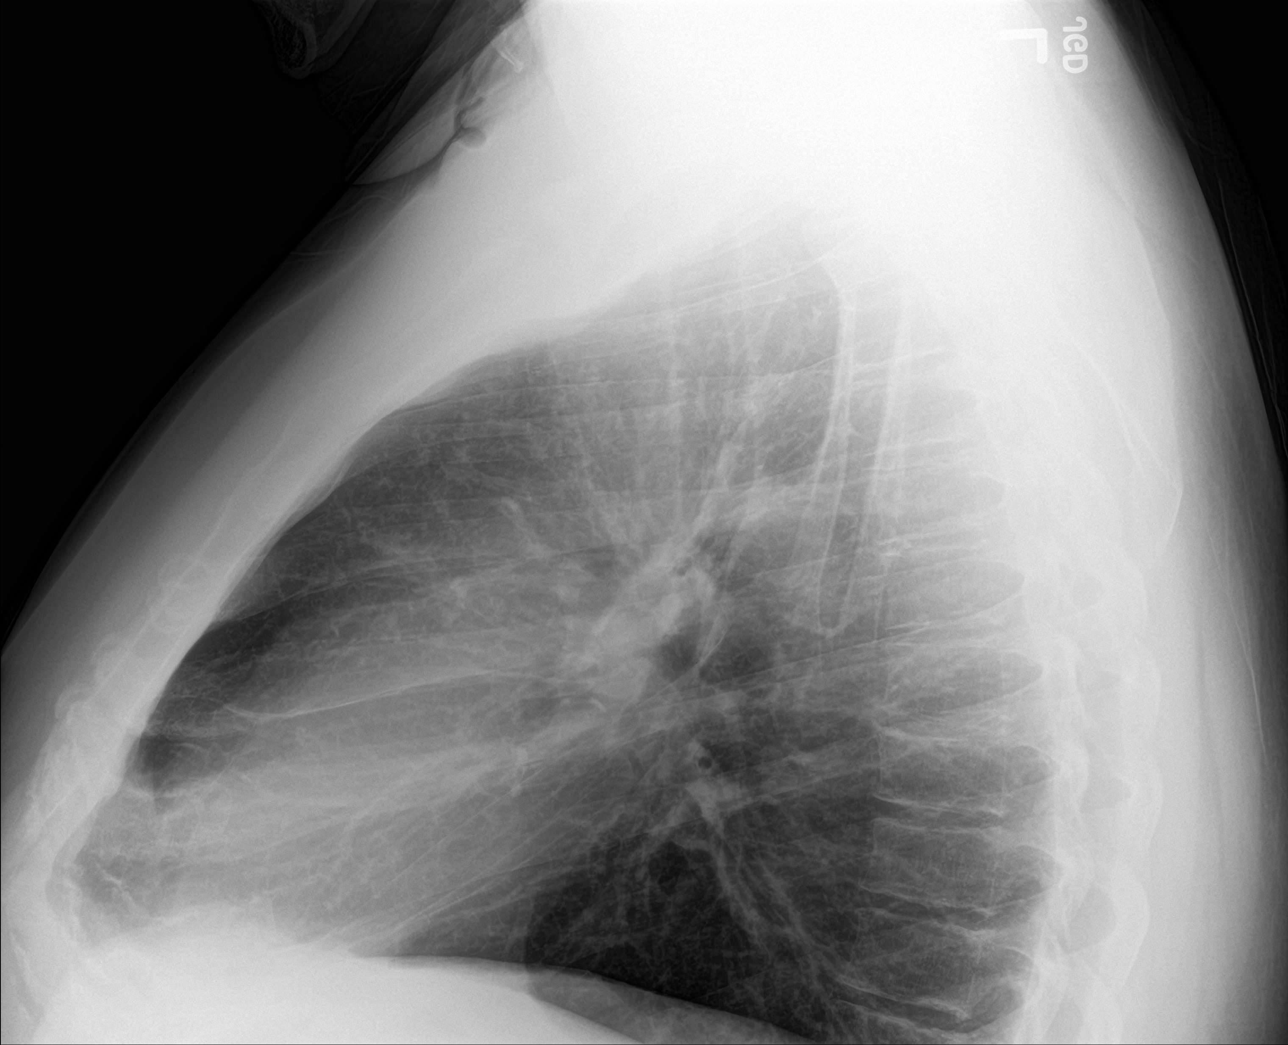

[chest pa (2 of 2)]
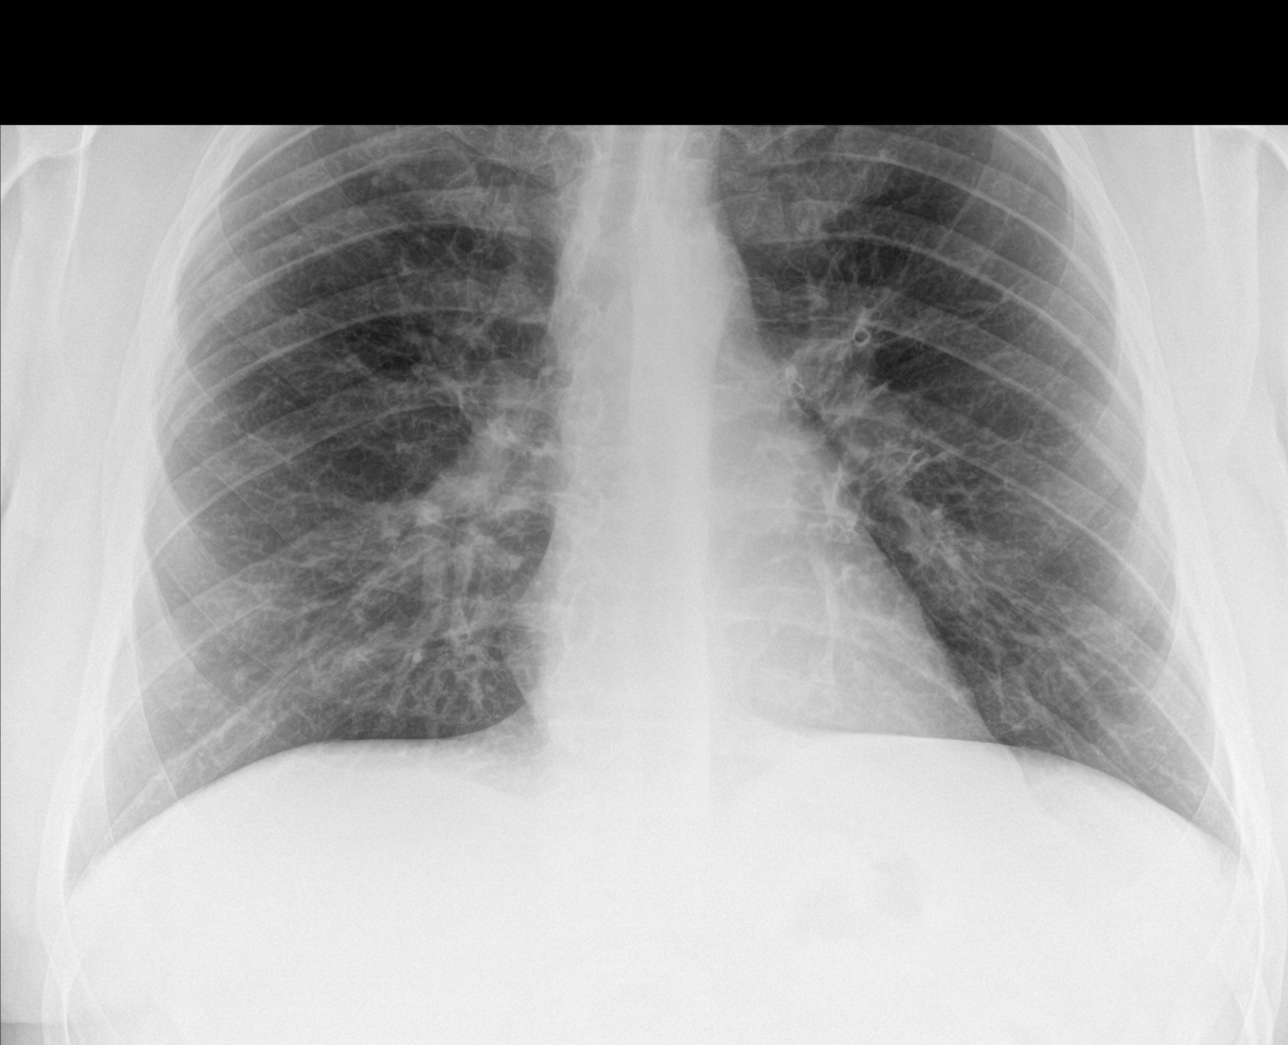

[4 of 4 positions shown; findings below may reference images not displayed]

FINDINGS: The heart size and mediastinal contours are within normal limits.
Both lungs are clear. The visualized skeletal structures are
unremarkable.
IMPRESSION: No active cardiopulmonary disease.

## 2022-12-27 ENCOUNTER — Telehealth: Payer: 59 | Admitting: Nurse Practitioner

## 2022-12-27 DIAGNOSIS — J4520 Mild intermittent asthma, uncomplicated: Secondary | ICD-10-CM

## 2022-12-27 DIAGNOSIS — R112 Nausea with vomiting, unspecified: Secondary | ICD-10-CM

## 2022-12-27 MED ORDER — ALBUTEROL SULFATE HFA 108 (90 BASE) MCG/ACT IN AERS: 2.0000 | INHALATION_SPRAY | Freq: Four times a day (QID) | RESPIRATORY_TRACT | 0 refills | Status: DC | PRN

## 2022-12-27 MED ORDER — ONDANSETRON HCL 4 MG PO TABS: 4.0000 mg | ORAL_TABLET | Freq: Three times a day (TID) | ORAL | 0 refills | Status: DC | PRN

## 2022-12-27 NOTE — Patient Instructions (Signed)
New Patient Visit with Russell [name] Tuesday January 03, 2023 Arrive by 8:05 AM EST Starts at 8:20 AM EST (40 minutes) Add to calendar [place] [id number] [phone]

## 2022-12-27 NOTE — Progress Notes (Signed)
Virtual Visit Consent   Russell [name], you are scheduled for a virtual visit with a [facility] provider today. Just as with appointments in the office, your consent must be obtained to participate. Your consent will be active for this visit and any virtual visit you may have with one of our providers in the next 365 days. If you have a MyChart account, a copy of this consent can be sent to you electronically.  As this is a virtual visit, video technology does not allow for your provider to perform a traditional examination. This may limit your provider's ability to fully assess your condition. If your provider identifies any concerns that need to be evaluated in person or the need to arrange testing (such as labs, EKG, etc.), we will make arrangements to do so. Although advances in technology are sophisticated, we cannot ensure that it will always work on either your end or our end. If the connection with a video visit is poor, the visit may have to be switched to a telephone visit. With either a video or telephone visit, we are not always able to ensure that we have a secure connection.  By engaging in this virtual visit, you consent to the provision of healthcare and authorize for your insurance to be billed (if applicable) for the services provided during this visit. Depending on your insurance coverage, you may receive a charge related to this service.  I need to obtain your verbal consent now. Are you willing to proceed with your visit today? GEROME KOKESH has provided verbal consent on 12/27/2022 for a virtual visit (video or telephone). Apolonio Schneiders, FNP  Date: 12/27/2022 1:23 PM  Virtual Visit via Video Note   I, Apolonio Schneiders, connected with  JOSEARMANDO KUHNERT  ([id number], May 26, 1991) on 12/27/22 at  2:00 PM EST by a video-enabled telemedicine application and verified that I am speaking with the correct person using two identifiers.  Location: Patient: Virtual Visit Location Patient:  Home Provider: Virtual Visit Location Provider: Home Office   I discussed the limitations of evaluation and management by telemedicine and the availability of in person appointments. The patient expressed understanding and agreed to proceed.    History of Present Illness: Russell [name] is a 32 y.o. who identifies as a male who was assigned male at birth, and is being seen today for asthma.  He has had nausea over the past two days, he did vomit all day yesterday and last night.  He has not had diarrhea  Denies fever or body aches  Denies any known sick contacts   Last thing he ate prior to getting ill was a meal cooked at home- no one else became ill (pork/rice mac and cheese)   He also has a history of asthma and is out of his Albuterol inhaler.  He is in the process of finding a new provider    Problems:  Patient Active Problem List   Diagnosis Date Noted   GAD (generalized anxiety disorder) 12/10/2015   Tinnitus 04/09/2015   Bronchospasm 03/02/2015   Allergy 78/29/5621   Complicated grief 30/86/5784   BMI 36.0-36.9,adult 11/02/2014    Allergies:  Allergies  Allergen Reactions   Penicillins     Unknown   Medications:  Current Outpatient Medications:    albuterol (VENTOLIN HFA) 108 (90 Base) MCG/ACT inhaler, Inhale 2 puffs into the lungs every 6 (six) hours as needed for wheezing or shortness of breath., Disp: 18 g, Rfl: 0   albuterol (  VENTOLIN HFA) 108 (90 Base) MCG/ACT inhaler, Inhale 2 puffs into the lungs every 6 (six) hours as needed for wheezing or shortness of breath., Disp: 8 g, Rfl: 0   benzonatate (TESSALON) 100 MG capsule, Take 1 capsule (100 mg total) by mouth 3 (three) times daily as needed for cough. Do not take with alcohol or while driving or operating heavy machinery, Disp: 21 capsule, Rfl: 0   EPINEPHrine (EPIPEN) 0.3 mg/0.3 mL DEVI, Inject 0.3 mLs (0.3 mg total) into the muscle once., Disp: 1 Device, Rfl: 0   fluticasone (FLONASE) 50 MCG/ACT nasal spray,  Place 1-2 sprays into both nostrils daily., Disp: 16 g, Rfl: 0   guaiFENesin (MUCINEX) 600 MG 12 hr tablet, Take 1 tablet (600 mg total) by mouth 2 (two) times daily., Disp: 6 tablet, Rfl: 0   ibuprofen (ADVIL) 800 MG tablet, Take 1 tablet (800 mg total) by mouth 3 (three) times daily., Disp: 21 tablet, Rfl: 0   mometasone-formoterol (DULERA) 200-5 MCG/ACT AERO, Inhale 2 puffs into the lungs 2 (two) times daily., Disp: 13 g, Rfl: 1   oxiconazole (OXISTAT) 1 % CREA, Apply 1 application  topically 2 (two) times daily., Disp: 30 g, Rfl: 0   Pseudoeph-Doxylamine-DM-APAP (DAYQUIL/NYQUIL COLD/FLU RELIEF PO), Take by mouth., Disp: , Rfl:   Observations/Objective: Patient is well-developed, well-nourished in no acute distress.  Resting comfortably  at home.  Head is normocephalic, atraumatic.  No labored breathing.  Speech is clear and coherent with logical content.  Patient is alert and oriented at baseline.    Assessment and Plan: 1. Mild intermittent asthma, unspecified whether complicated  - albuterol (VENTOLIN HFA) 108 (90 Base) MCG/ACT inhaler; Inhale 2 puffs into the lungs every 6 (six) hours as needed for wheezing or shortness of breath.  Dispense: 8 g; Refill: 0  2. Nausea and vomiting, unspecified vomiting type  - ondansetron (ZOFRAN) 4 MG tablet; Take 1 tablet (4 mg total) by mouth every 8 (eight) hours as needed for nausea or vomiting.  Dispense: 20 tablet; Refill: 0    Scheduled with PCP for next week to establish care   Follow Up Instructions: I discussed the assessment and treatment plan with the patient. The patient was provided an opportunity to ask questions and all were answered. The patient agreed with the plan and demonstrated an understanding of the instructions.  A copy of instructions were sent to the patient via MyChart unless otherwise noted below.    The patient was advised to call back or seek an in-person evaluation if the symptoms worsen or if the condition fails  to improve as anticipated.  Time:  I spent 10 minutes with the patient via telehealth technology discussing the above problems/concerns.    Apolonio Schneiders, FNP

## 2023-01-03 ENCOUNTER — Ambulatory Visit: Payer: 59 | Admitting: Internal Medicine

## 2023-01-13 ENCOUNTER — Ambulatory Visit (INDEPENDENT_AMBULATORY_CARE_PROVIDER_SITE_OTHER): Payer: 59 | Admitting: Internal Medicine

## 2023-01-13 ENCOUNTER — Encounter: Payer: Self-pay | Admitting: Internal Medicine

## 2023-01-13 VITALS — BP 139/75 | HR 83 | Temp 98.5°F | Ht 77.0 in | Wt 320.2 lb

## 2023-01-13 DIAGNOSIS — E669 Obesity, unspecified: Secondary | ICD-10-CM

## 2023-01-13 DIAGNOSIS — R5383 Other fatigue: Secondary | ICD-10-CM

## 2023-01-13 DIAGNOSIS — F3132 Bipolar disorder, current episode depressed, moderate: Secondary | ICD-10-CM

## 2023-01-13 DIAGNOSIS — J4540 Moderate persistent asthma, uncomplicated: Secondary | ICD-10-CM

## 2023-01-13 DIAGNOSIS — R531 Weakness: Secondary | ICD-10-CM

## 2023-01-13 DIAGNOSIS — G4730 Sleep apnea, unspecified: Secondary | ICD-10-CM

## 2023-01-13 DIAGNOSIS — R112 Nausea with vomiting, unspecified: Secondary | ICD-10-CM

## 2023-01-13 DIAGNOSIS — J4541 Moderate persistent asthma with (acute) exacerbation: Secondary | ICD-10-CM

## 2023-01-13 DIAGNOSIS — M25522 Pain in left elbow: Secondary | ICD-10-CM

## 2023-01-13 DIAGNOSIS — J45909 Unspecified asthma, uncomplicated: Secondary | ICD-10-CM

## 2023-01-13 DIAGNOSIS — M79602 Pain in left arm: Secondary | ICD-10-CM

## 2023-01-13 DIAGNOSIS — J208 Acute bronchitis due to other specified organisms: Secondary | ICD-10-CM

## 2023-01-13 DIAGNOSIS — J4520 Mild intermittent asthma, uncomplicated: Secondary | ICD-10-CM

## 2023-01-13 DIAGNOSIS — M25812 Other specified joint disorders, left shoulder: Secondary | ICD-10-CM | POA: Insufficient documentation

## 2023-01-13 HISTORY — DX: Unspecified asthma, uncomplicated: J45.909

## 2023-01-13 HISTORY — DX: Pain in left elbow: M25.522

## 2023-01-13 LAB — VITAMIN D 25 HYDROXY (VIT D DEFICIENCY, FRACTURES): VITD: 12.4 ng/mL — ABNORMAL LOW (ref 30.00–100.00)

## 2023-01-13 LAB — TSH: TSH: 0.78 u[IU]/mL (ref 0.35–5.50)

## 2023-01-13 LAB — COMPREHENSIVE METABOLIC PANEL
ALT: 26 U/L (ref 0–53)
AST: 15 U/L (ref 0–37)
Albumin: 4.6 g/dL (ref 3.5–5.2)
Alkaline Phosphatase: 41 U/L (ref 39–117)
BUN: 13 mg/dL (ref 6–23)
CO2: 24 mEq/L (ref 19–32)
Calcium: 9 mg/dL (ref 8.4–10.5)
Chloride: 107 mEq/L (ref 96–112)
Creatinine, Ser: 0.9 mg/dL (ref 0.40–1.50)
GFR: 114.88 mL/min (ref >60.00)
Glucose, Bld: 94 mg/dL (ref 70–99)
Potassium: 4.1 mEq/L (ref 3.5–5.1)
Sodium: 140 mEq/L (ref 135–145)
Total Bilirubin: 1.1 mg/dL (ref 0.2–1.2)
Total Protein: 6.9 g/dL (ref 6.0–8.3)

## 2023-01-13 LAB — CBC
HCT: 42 % (ref 39.0–52.0)
Hemoglobin: 14.8 g/dL (ref 13.0–17.0)
MCHC: 35.3 g/dL (ref 30.0–36.0)
MCV: 81 fl (ref 78.0–100.0)
Platelets: 245 10*3/uL (ref 150.0–400.0)
RBC: 5.19 Mil/uL (ref 4.22–5.81)
RDW: 13.3 % (ref 11.5–15.5)
WBC: 6.6 10*3/uL (ref 4.0–10.5)

## 2023-01-13 LAB — B12 AND FOLATE PANEL
Folate: 11.1 ng/mL (ref >5.9)
Vitamin B-12: 152 pg/mL — ABNORMAL LOW (ref 211–911)

## 2023-01-13 LAB — HEMOGLOBIN A1C: Hgb A1c MFr Bld: 4.5 % — ABNORMAL LOW (ref 4.6–6.5)

## 2023-01-13 MED ORDER — ONDANSETRON 4 MG PO TBDP: 4.0000 mg | ORAL_TABLET | Freq: Three times a day (TID) | ORAL | 0 refills | Status: DC | PRN

## 2023-01-13 MED ORDER — PEPCID COMPLETE 10-800-165 MG PO CHEW: 1.0000 | CHEWABLE_TABLET | Freq: Every day | ORAL | 11 refills | Status: DC | PRN

## 2023-01-13 MED ORDER — ALBUTEROL SULFATE HFA 108 (90 BASE) MCG/ACT IN AERS: 2.0000 | INHALATION_SPRAY | Freq: Four times a day (QID) | RESPIRATORY_TRACT | 11 refills | Status: DC | PRN

## 2023-01-13 MED ORDER — MOMETASONE FURO-FORMOTEROL FUM 200-5 MCG/ACT IN AERO: 2.0000 | INHALATION_SPRAY | Freq: Two times a day (BID) | RESPIRATORY_TRACT | 3 refills | Status: DC

## 2023-01-13 NOTE — Assessment & Plan Note (Addendum)
He has been on 200 Dulera with good response not requiring any rescue inhaler but just had some flares due to not having any insurance which he  recently got just really sending in the [facility] for a year supply and the inhalers which hopefully he will not need but he needs to keep in stock

## 2023-01-14 LAB — HEPATITIS C ANTIBODY: Hepatitis C Ab: NONREACTIVE

## 2023-01-14 LAB — HIV ANTIBODY (ROUTINE TESTING W REFLEX): HIV 1&2 Ab, 4th Generation: NONREACTIVE

## 2023-01-14 NOTE — Progress Notes (Addendum)
[place]  Phone: [phone]  New patient visit  Visit Date: 01/13/2023 Patient: Russell [name]   DOB: [birth year]   32 y.o. Male  MRN: [id number]  Today's healthcare provider: Loralee Pacas, MD  Assessment and Plan:   Russell [name] was seen today for new patient (initial visit), pain in shoulders and elbows, medication management and fatigue.  Moderate persistent asthma without complication Overview: Since childhood - diagnosed age 44 Never hospitalized for. Vapes Delta 8 thc Bronchitis 2x since 2020 never hospitalized  Assessment & Plan: He has been on 200 Dulera with good response not requiring any rescue inhaler but just had some flares due to not having any insurance which he  recently got just really sending in the [facility] for a year supply and the inhalers which hopefully he will not need but he needs to keep in stock  Orders: -     Albuterol Sulfate HFA; Inhale 2 puffs into the lungs every 6 (six) hours as needed for wheezing or shortness of breath.  Dispense: 8 g; Refill: 11 -     Mometasone Furo-Formoterol Fum; Inhale 2 puffs into the lungs 2 (two) times daily.  Dispense: 13 g; Refill: 3  Left arm pain -     Ambulatory referral to Sports Medicine  Other fatigue -     Ambulatory referral to Sports Medicine -     CBC -     Comprehensive metabolic panel -     Hepatitis C antibody -     HIV Antibody (routine testing w rflx) -     B12 and Folate Panel -     VITAMIN D 25 Hydroxy (Vit-D Deficiency, Fractures) -     TSH -     Hemoglobin A1c  Weakness  Nausea and vomiting, unspecified vomiting type Overview: In mornings only Encouraged take pepcid complete at night and Zofran as needed in am  Orders: -     Pepcid Complete; Chew 1 tablet by mouth daily as needed.  Dispense: 100 tablet; Refill: 11  Left elbow pain Overview: Per sports medicine: Thought to be cubital tunnel syndrome likely.  February 2024 Elbow > shoulder Radiates to numbing  sensation in pinky and ring finger. 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 a theatre or a meeting) 2  As a passenger in a car for an hour without a break 3  Lying down to rest in the afternoon when circumstances permit 3  Sitting and talking to someone 2  Sitting quietly after a lunch without alcohol 1  In a car, while stopped for a few minutes in traffic 2  Total score 17     Assessment & Plan: Referred to sleep for management  Unconfirmed  Orders: -     Ambulatory referral to Sleep Studies  Bipolar 1 disorder, depressed, moderate ([place]) -     Ambulatory referral to Psychology      Subjective:  Patient presents today to establish care.  Chief Complaint  Patient presents with   New Patient (Initial Visit)   Pain in shoulders and elbows    For about a year.   Medication Management  Needs inhaler-prefers Dulera, EPI pen.   Fatigue    No energy, exhausted all the time.    Problem-oriented charting was used to develop and update his medical history: Problem  Asthma   Since childhood - diagnosed age 56 Never hospitalized for. Vapes Delta 8 thc Bronchitis 2x since 2020 never hospitalized   Sleep Apnea   Witnessed    02/01/2023    7:55 AM  Results of the Epworth flowsheet  Sitting and reading 1  Watching TV 3  Sitting, inactive in a public place (e.g. a theatre or a meeting) 2  As a passenger in a car for an hour without a break 3  Lying down to rest in the afternoon when circumstances permit 3  Sitting and talking to someone 2  Sitting quietly after a lunch without alcohol 1  In a car, while stopped for a few minutes in traffic 2  Total score 17     Viral Bronchitis (Resolved)  Moderate  Persistent Asthma With Exacerbation (Resolved)  Moderate Episode of Recurrent Major Depressive Disorder (Hcc) (Resolved)  Bronchospasm (Resolved)     Depression Screen    01/13/2023    9:21 AM 05/13/2016    1:44 PM 02/03/2016    6:20 PM 01/01/2016    1:50 PM  PHQ 2/9 Scores  PHQ - 2 Score 2 2 0 0  PHQ- 9 Score 9 6     Results for orders placed or performed in visit on 01/13/23  CBC  Result Value Ref Range   WBC 6.6 4.0 - 10.5 K/uL   RBC 5.19 4.22 - 5.81 Mil/uL   Platelets 245.0 150.0 - 400.0 K/uL   Hemoglobin 14.8 13.0 - 17.0 g/dL   HCT 42.0 39.0 - 52.0 %   MCV 81.0 78.0 - 100.0 fl   MCHC 35.3 30.0 - 36.0 g/dL   RDW 13.3 11.5 - 15.5 %  Comp Met (CMET)  Result Value Ref Range   Sodium 140 135 - 145 mEq/L   Potassium 4.1 3.5 - 5.1 mEq/L   Chloride 107 96 - 112 mEq/L   CO2 24 19 - 32 mEq/L   Glucose, Bld 94 70 - 99 mg/dL   BUN 13 6 - 23 mg/dL   Creatinine, Ser 0.90 0.40 - 1.50 mg/dL   Total Bilirubin 1.1 0.2 - 1.2 mg/dL   Alkaline Phosphatase 41 39 - 117 U/L   AST 15 0 - 37 U/L   ALT 26 0 - 53 U/L   Total Protein 6.9 6.0 - 8.3 g/dL   Albumin 4.6 3.5 - 5.2 g/dL   GFR 114.88 >60.00 mL/min   Calcium 9.0 8.4 - 10.5 mg/dL  Hepatitis C Antibody  Result Value Ref Range   Hepatitis C Ab NON-REACTIVE NON-REACTIVE  HIV antibody (with reflex)  Result Value Ref Range   HIV 1&2 Ab, 4th Generation NON-REACTIVE NON-REACTIVE  B12 and Folate Panel  Result Value Ref Range   Vitamin B-12 152 (L) 211 - 911 pg/mL   Folate 11.1 >5.9 ng/mL  VITAMIN D 25 Hydroxy (Vit-D Deficiency, Fractures)  Result Value Ref Range   VITD 12.40 (L) 30.00 - 100.00 ng/mL  TSH  Result Value Ref Range   TSH 0.78 0.35 - 5.50 uIU/mL  HgB A1c  Result Value Ref Range   Hgb A1c MFr Bld 4.5 (L) 4.6 - 6.5 %    The following were reviewed and entered/updated into his MEDICAL RECORD NUMBERPast Medical History:  Diagnosis Date   Allergy    Anxiety  Asthma    Asthma 01/13/2023   Depression    Left elbow pain  01/13/2023   Elbow > shoulder Radiates to numbing sensation in pinky and ring finger. Not lifting heavy stuff   Obesity    Past Surgical History:  Procedure Laterality Date   TONSILLECTOMY     TYMPANOSTOMY TUBE PLACEMENT     Family History  Problem Relation Age of Onset   Hyperlipidemia Mother    Early death Mother    Hypertension Mother    Mental illness Mother    Alcohol abuse Father    Asthma Father    COPD Father    Drug abuse Father    Early death Father    Hypertension Father    Hyperlipidemia Father    Stroke Father    Heart attack Father    Mental retardation Maternal Uncle    Heart disease Maternal Grandfather    Outpatient Medications Prior to Visit  Medication Sig Dispense Refill   EPINEPHrine (EPIPEN) 0.3 mg/0.3 mL DEVI Inject 0.3 mLs (0.3 mg total) into the muscle once. 1 Device 0   fluticasone (FLONASE) 50 MCG/ACT nasal spray Place 1-2 sprays into both nostrils daily. 16 g 0   albuterol (VENTOLIN HFA) 108 (90 Base) MCG/ACT inhaler Inhale 2 puffs into the lungs every 6 (six) hours as needed for wheezing or shortness of breath. 18 g 0   albuterol (VENTOLIN HFA) 108 (90 Base) MCG/ACT inhaler Inhale 2 puffs into the lungs every 6 (six) hours as needed for wheezing or shortness of breath. 8 g 0   albuterol (VENTOLIN HFA) 108 (90 Base) MCG/ACT inhaler Inhale 2 puffs into the lungs every 6 (six) hours as needed for wheezing or shortness of breath. 8 g 0   ondansetron (ZOFRAN) 4 MG tablet Take 1 tablet (4 mg total) by mouth every 8 (eight) hours as needed for nausea or vomiting. 20 tablet 0   ibuprofen (ADVIL) 800 MG tablet Take 1 tablet (800 mg total) by mouth 3 (three) times daily. (Patient not taking: Reported on 01/13/2023) 21 tablet 0   oxiconazole (OXISTAT) 1 % CREA Apply 1 application  topically 2 (two) times daily. (Patient not taking: Reported on 01/13/2023) 30 g 0   benzonatate (TESSALON) 100 MG capsule Take 1 capsule (100 mg total) by mouth 3 (three) times daily  as needed for cough. Do not take with alcohol or while driving or operating heavy machinery (Patient not taking: Reported on 01/13/2023) 21 capsule 0   guaiFENesin (MUCINEX) 600 MG 12 hr tablet Take 1 tablet (600 mg total) by mouth 2 (two) times daily. (Patient not taking: Reported on 01/13/2023) 6 tablet 0   mometasone-formoterol (DULERA) 200-5 MCG/ACT AERO Inhale 2 puffs into the lungs 2 (two) times daily. (Patient not taking: Reported on 01/13/2023) 13 g 1   Pseudoeph-Doxylamine-DM-APAP (DAYQUIL/NYQUIL COLD/FLU RELIEF PO) Take by mouth. (Patient not taking: Reported on 01/13/2023)     No facility-administered medications prior to visit.    Allergies  Allergen Reactions   Penicillins     Unknown   Social History   Tobacco Use   Smoking status: Never   Smokeless tobacco: Current    Types: Chew  Vaping Use   Vaping Use: Every day  Substance Use Topics   Alcohol use: Yes    Comment: occasionally   Drug use: No    Immunization History  Administered Date(s) Administered   Tdap 11/17/2014    Objective:  BP 139/75 (BP Location: Left Arm, Patient Position: Sitting)  Pulse 83   Temp 98.5 F (36.9 C) (Temporal)   Ht 6' 5"$  (1.956 m)   Wt (!) 320 lb 3.2 oz (145.2 kg)   SpO2 96%   BMI 37.97 kg/m  Body mass index is 37.97 kg/m. indicates this is an Obese male , but waist circumference is a better indicator of healthy body composition. Physical Exam  Vital signs reviewed.  Nursing notes reviewed. General Appearance/Constitutional:  polite male in no acute distress Musculoskeletal: All extremities are intact.  Neurological:  Awake, alert,  No obvious focal neurological deficits or cognitive impairments Psychiatric:  Appropriate mood, pleasant demeanor   Results Reviewed: Results for orders placed or performed in visit on 01/13/23  CBC  Result Value Ref Range   WBC 6.6 4.0 - 10.5 K/uL   RBC 5.19 4.22 - 5.81 Mil/uL   Platelets 245.0 150.0 - 400.0 K/uL   Hemoglobin 14.8 13.0 -  17.0 g/dL   HCT 42.0 39.0 - 52.0 %   MCV 81.0 78.0 - 100.0 fl   MCHC 35.3 30.0 - 36.0 g/dL   RDW 13.3 11.5 - 15.5 %  Comp Met (CMET)  Result Value Ref Range   Sodium 140 135 - 145 mEq/L   Potassium 4.1 3.5 - 5.1 mEq/L   Chloride 107 96 - 112 mEq/L   CO2 24 19 - 32 mEq/L   Glucose, Bld 94 70 - 99 mg/dL   BUN 13 6 - 23 mg/dL   Creatinine, Ser 0.90 0.40 - 1.50 mg/dL   Total Bilirubin 1.1 0.2 - 1.2 mg/dL   Alkaline Phosphatase 41 39 - 117 U/L   AST 15 0 - 37 U/L   ALT 26 0 - 53 U/L   Total Protein 6.9 6.0 - 8.3 g/dL   Albumin 4.6 3.5 - 5.2 g/dL   GFR 114.88 >60.00 mL/min   Calcium 9.0 8.4 - 10.5 mg/dL  Hepatitis C Antibody  Result Value Ref Range   Hepatitis C Ab NON-REACTIVE NON-REACTIVE  HIV antibody (with reflex)  Result Value Ref Range   HIV 1&2 Ab, 4th Generation NON-REACTIVE NON-REACTIVE  B12 and Folate Panel  Result Value Ref Range   Vitamin B-12 152 (L) 211 - 911 pg/mL   Folate 11.1 >5.9 ng/mL  VITAMIN D 25 Hydroxy (Vit-D Deficiency, Fractures)  Result Value Ref Range   VITD 12.40 (L) 30.00 - 100.00 ng/mL  TSH  Result Value Ref Range   TSH 0.78 0.35 - 5.50 uIU/mL  HgB A1c  Result Value Ref Range   Hgb A1c MFr Bld 4.5 (L) 4.6 - 6.5 %

## 2023-01-14 NOTE — Progress Notes (Incomplete Revision)
[facility] [place]  Phone: [phone]  New patient visit  Visit Date: 01/13/2023 Patient: Russell [name]   DOB: 05-05-1991   31 y.o. Male  MRN: [id number]  Today's healthcare provider: Lula Olszewski, MD  Assessment and Plan:   Russell [name] was seen today for new patient (initial visit), pain in shoulders and elbows, medication management and fatigue.  Moderate persistent asthma without complication Overview: Since childhood - diagnosed age 47 Never hospitalized for. Vapes Delta 8 thc Bronchitis 2x since 2020 never hospitalized  Assessment & Plan: He has been on 200 Dulera with good response not requiring any rescue inhaler but just had some flares due to not having any insurance which she recently got just really sending in the [facility] for a year supply and the inhalers which hopefully he will need but he needs to keep in stock   Mild intermittent asthma, unspecified whether complicated Overview: Since childhood - diagnosed age 12 Never hospitalized for. Vapes Delta 8 thc Bronchitis 2x since 2020 never hospitalized  Assessment & Plan: He has been on 200 Dulera with good response not requiring any rescue inhaler but just had some flares due to not having any insurance which she recently got just really sending in the [facility] for a year supply and the inhalers which hopefully he will need but he needs to keep in stock  Orders: -     Albuterol Sulfate HFA; Inhale 2 puffs into the lungs every 6 (six) hours as needed for wheezing or shortness of breath.  Dispense: 8 g; Refill: 11  Viral bronchitis -     Mometasone Furo-Formoterol Fum; Inhale 2 puffs into the lungs 2 (two) times daily.  Dispense: 13 g; Refill: 3  Moderate persistent asthma with exacerbation -     Mometasone Furo-Formoterol Fum; Inhale 2 puffs into the lungs 2 (two) times daily.  Dispense: 13 g; Refill: 3  Left arm pain -     Ambulatory referral to Sports Medicine  Other fatigue -     Ambulatory  referral to Sports Medicine -     CBC -     Comprehensive metabolic panel -     Hepatitis C antibody -     HIV Antibody (routine testing w rflx) -     B12 and Folate Panel -     VITAMIN D 25 Hydroxy (Vit-D Deficiency, Fractures) -     TSH -     Hemoglobin A1c  Weakness  Nausea and vomiting, unspecified vomiting type Overview: In mornings only Encouraged take pepcid complete at night and Zofran as needed in am  Orders: -     Ondansetron; Take 1 tablet (4 mg total) by mouth every 8 (eight) hours as needed for nausea or vomiting (for nausea from wegovy or other source).  Dispense: 20 tablet; Refill: 0 -     Pepcid Complete; Chew 1 tablet by mouth daily as needed.  Dispense: 100 tablet; Refill: 11  Left elbow pain Overview: Elbow > shoulder Radiates to numbing sensation in pinky and ring finger. Not lifting heavy stuff   Obesity due to energy imbalance -     Hemoglobin A1c -     Lipid panel; Future  Sleep apnea, unspecified type -     Ambulatory referral to Sleep Studies  Bipolar 1 disorder, depressed, moderate (HCC) -     Ambulatory referral to Psychology      Subjective:  Patient presents today to establish care.  Chief Complaint  Patient presents with  .  New Patient (Initial Visit)  . Pain in shoulders and elbows    For about a year.  . Medication Management    Needs inhaler-prefers Dulera, EPI pen.  . Fatigue    No energy, exhausted all the time.    Problem-oriented charting was used to develop and update his medical history: Problem  Asthma   Since childhood - diagnosed age 83 Never hospitalized for. Vapes Delta 8 thc Bronchitis 2x since 2020 never hospitalized      Depression Screen    01/13/2023    9:21 AM 05/13/2016    1:44 PM 02/03/2016    6:20 PM 01/01/2016    1:50 PM  PHQ 2/9 Scores  PHQ - 2 Score 2 2 0 0  PHQ- 9 Score 9 6     Results for orders placed or performed in visit on 01/13/23  CBC  Result Value Ref Range   WBC 6.6 4.0 - 10.5  K/uL   RBC 5.19 4.22 - 5.81 Mil/uL   Platelets 245.0 150.0 - 400.0 K/uL   Hemoglobin 14.8 13.0 - 17.0 g/dL   HCT 42.0 39.0 - 52.0 %   MCV 81.0 78.0 - 100.0 fl   MCHC 35.3 30.0 - 36.0 g/dL   RDW 13.3 11.5 - 15.5 %  Comp Met (CMET)  Result Value Ref Range   Sodium 140 135 - 145 mEq/L   Potassium 4.1 3.5 - 5.1 mEq/L   Chloride 107 96 - 112 mEq/L   CO2 24 19 - 32 mEq/L   Glucose, Bld 94 70 - 99 mg/dL   BUN 13 6 - 23 mg/dL   Creatinine, Ser 0.90 0.40 - 1.50 mg/dL   Total Bilirubin 1.1 0.2 - 1.2 mg/dL   Alkaline Phosphatase 41 39 - 117 U/L   AST 15 0 - 37 U/L   ALT 26 0 - 53 U/L   Total Protein 6.9 6.0 - 8.3 g/dL   Albumin 4.6 3.5 - 5.2 g/dL   GFR 114.88 >60.00 mL/min   Calcium 9.0 8.4 - 10.5 mg/dL  B12 and Folate Panel  Result Value Ref Range   Vitamin B-12 152 (L) 211 - 911 pg/mL   Folate 11.1 >5.9 ng/mL  VITAMIN D 25 Hydroxy (Vit-D Deficiency, Fractures)  Result Value Ref Range   VITD 12.40 (L) 30.00 - 100.00 ng/mL  TSH  Result Value Ref Range   TSH 0.78 0.35 - 5.50 uIU/mL  HgB A1c  Result Value Ref Range   Hgb A1c MFr Bld 4.5 (L) 4.6 - 6.5 %    The following were reviewed and entered/updated into his MEDICAL RECORD NUMBERPast Medical History:  Diagnosis Date  . Allergy   . Anxiety   . Asthma   . Asthma 01/13/2023  . Depression   . Left elbow pain 01/13/2023   Elbow > shoulder Radiates to numbing sensation in pinky and ring finger. Not lifting heavy stuff  . Obesity    Past Surgical History:  Procedure Laterality Date  . TONSILLECTOMY    . TYMPANOSTOMY TUBE PLACEMENT     Family History  Problem Relation Age of Onset  . Hyperlipidemia Mother   . Early death Mother   . Hypertension Mother   . Mental illness Mother   . Alcohol abuse Father   . Asthma Father   . COPD Father   . Drug abuse Father   . Early death Father   . Hypertension Father   . Hyperlipidemia Father   . Stroke Father   . Heart  attack Father   . Mental retardation Maternal Uncle   .  Heart disease Maternal Grandfather    Outpatient Medications Prior to Visit  Medication Sig Dispense Refill  . EPINEPHrine (EPIPEN) 0.3 mg/0.3 mL DEVI Inject 0.3 mLs (0.3 mg total) into the muscle once. 1 Device 0  . fluticasone (FLONASE) 50 MCG/ACT nasal spray Place 1-2 sprays into both nostrils daily. 16 g 0  . ondansetron (ZOFRAN) 4 MG tablet Take 1 tablet (4 mg total) by mouth every 8 (eight) hours as needed for nausea or vomiting. 20 tablet 0  . albuterol (VENTOLIN HFA) 108 (90 Base) MCG/ACT inhaler Inhale 2 puffs into the lungs every 6 (six) hours as needed for wheezing or shortness of breath. 18 g 0  . albuterol (VENTOLIN HFA) 108 (90 Base) MCG/ACT inhaler Inhale 2 puffs into the lungs every 6 (six) hours as needed for wheezing or shortness of breath. 8 g 0  . albuterol (VENTOLIN HFA) 108 (90 Base) MCG/ACT inhaler Inhale 2 puffs into the lungs every 6 (six) hours as needed for wheezing or shortness of breath. 8 g 0  . benzonatate (TESSALON) 100 MG capsule Take 1 capsule (100 mg total) by mouth 3 (three) times daily as needed for cough. Do not take with alcohol or while driving or operating heavy machinery (Patient not taking: Reported on 01/13/2023) 21 capsule 0  . guaiFENesin (MUCINEX) 600 MG 12 hr tablet Take 1 tablet (600 mg total) by mouth 2 (two) times daily. (Patient not taking: Reported on 01/13/2023) 6 tablet 0  . ibuprofen (ADVIL) 800 MG tablet Take 1 tablet (800 mg total) by mouth 3 (three) times daily. (Patient not taking: Reported on 01/13/2023) 21 tablet 0  . oxiconazole (OXISTAT) 1 % CREA Apply 1 application  topically 2 (two) times daily. (Patient not taking: Reported on 01/13/2023) 30 g 0  . Pseudoeph-Doxylamine-DM-APAP (DAYQUIL/NYQUIL COLD/FLU RELIEF PO) Take by mouth. (Patient not taking: Reported on 01/13/2023)    . mometasone-formoterol (DULERA) 200-5 MCG/ACT AERO Inhale 2 puffs into the lungs 2 (two) times daily. (Patient not taking: Reported on 01/13/2023) 13 g 1   No  facility-administered medications prior to visit.    Allergies  Allergen Reactions  . Penicillins     Unknown   Social History   Tobacco Use  . Smoking status: Never  . Smokeless tobacco: Current    Types: Chew  Vaping Use  . Vaping Use: Every day  Substance Use Topics  . Alcohol use: Yes    Comment: occasionally  . Drug use: No    Immunization History  Administered Date(s) Administered  . Tdap 11/17/2014    Objective:  BP 139/75 (BP Location: Left Arm, Patient Position: Sitting)   Pulse 83   Temp 98.5 F (36.9 C) (Temporal)   Ht 6\' 5"  (1.956 m)   Wt (!) 320 lb 3.2 oz (145.2 kg)   SpO2 96%   BMI 37.97 kg/m  Body mass index is 37.97 kg/m. indicates this is an Obese male , but waist circumference is a better indicator of healthy body composition. Physical Exam  Vital signs reviewed.  Nursing notes reviewed. General Appearance/Constitutional:  polite male in no acute distress Musculoskeletal: All extremities are intact.  Neurological:  Awake, alert,  No obvious focal neurological deficits or cognitive impairments Psychiatric:  Appropriate mood, pleasant demeanor   Results Reviewed: Results for orders placed or performed in visit on 01/13/23  CBC  Result Value Ref Range   WBC 6.6 4.0 - 10.5 K/uL  RBC 5.19 4.22 - 5.81 Mil/uL   Platelets 245.0 150.0 - 400.0 K/uL   Hemoglobin 14.8 13.0 - 17.0 g/dL   HCT 45.3 64.6 - 80.3 %   MCV 81.0 78.0 - 100.0 fl   MCHC 35.3 30.0 - 36.0 g/dL   RDW 21.2 24.8 - 25.0 %  Comp Met (CMET)  Result Value Ref Range   Sodium 140 135 - 145 mEq/L   Potassium 4.1 3.5 - 5.1 mEq/L   Chloride 107 96 - 112 mEq/L   CO2 24 19 - 32 mEq/L   Glucose, Bld 94 70 - 99 mg/dL   BUN 13 6 - 23 mg/dL   Creatinine, Ser 0.37 0.40 - 1.50 mg/dL   Total Bilirubin 1.1 0.2 - 1.2 mg/dL   Alkaline Phosphatase 41 39 - 117 U/L   AST 15 0 - 37 U/L   ALT 26 0 - 53 U/L   Total Protein 6.9 6.0 - 8.3 g/dL   Albumin 4.6 3.5 - 5.2 g/dL   GFR 048.88 >91.69 mL/min    Calcium 9.0 8.4 - 10.5 mg/dL  I50 and Folate Panel  Result Value Ref Range   Vitamin B-12 152 (L) 211 - 911 pg/mL   Folate 11.1 >5.9 ng/mL  VITAMIN D 25 Hydroxy (Vit-D Deficiency, Fractures)  Result Value Ref Range   VITD 12.40 (L) 30.00 - 100.00 ng/mL  TSH  Result Value Ref Range   TSH 0.78 0.35 - 5.50 uIU/mL  HgB A1c  Result Value Ref Range   Hgb A1c MFr Bld 4.5 (L) 4.6 - 6.5 %

## 2023-01-19 NOTE — Progress Notes (Signed)
I, Peterson Lombard, LAT, ATC acting as a scribe for Lynne Leader, MD.  Subjective:    CC: L arm pain  HPI: Patient is a 32 year old male presenting with left arm pain 1.5 years. Pt use to deliver washers/dryers for work. Patient locates pain to the posterior aspect of the L arm from shoulder to elbow. 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  Neck pain: no Grip strength: yes Radiates: Yes- Paresthesias: yes- fingers 4-5 Aggravates: trying to sleep at night, and first thing in the morning Treatments tried: nothing   Pertinent review of Systems: No fevers or chills  Relevant historical information: Depression anxiety.  Possible bipolar disorder.   Objective:    Vitals:   01/20/23 0912  BP: 138/76  Pulse: 85  SpO2: 96%   General: Well Developed, well nourished, and in no acute distress.   MSK: C-spine: Normal appearing Nontender palpation spinal midline. Normal cervical motion.  Upper extremity strength is intact.  Reflexes are intact. Upper extremity strength is intact.  Left shoulder: Normal-appearing Nontender palpation. Normal shoulder motion pain with abduction and internal rotation. Positive Hawkins and Neer's test. Positive empty can test. Negative Yergason's and speeds test. Pulses and capillary refill are intact distally.  Left elbow: Normal-appearing normal elbow motion.  Negative Tinel's test at the cubital tunnel  Lab and Radiology Results  Diagnostic Limited MSK Ultrasound of: Left shoulder Accuracy of ultrasound limited by body habitus. Biceps tendon normal-appearing Subscapularis tendon intact. Supraspinatus tendon appears to be intact with mild subacromial bursitis. Infraspinatus tendon is intact. AC joint mild degenerative. Impression: Subacromial bursitis.  X-ray images left shoulder obtained today personally and independently interpreted No acute fractures.  No significant degenerative  changes. Await formal radiology review  Impression and Recommendations:    Assessment and Plan: 32 y.o. male with chronic left shoulder pain.  Pain ongoing for about 18 months without obvious injury.  His pain is most consistent with subacromial impingement and subacromial bursitis.  He is an excellent candidate for physical therapy.  Plan to refer to PT.  He does have some paresthesias to his ulnar hand fourth and fifth digits thought to be most likely cubital tunnel syndrome.  Recommend night splint.  Check back in 6 weeks.Marland Kitchen  PDMP not reviewed this encounter. Orders Placed This Encounter  Procedures   [place] LIMITED JOINT SPACE STRUCTURES UP LEFT(NO LINKED CHARGES)    Order Specific Question:   Reason for Exam (SYMPTOM  OR DIAGNOSIS REQUIRED)    Answer:   eval left shoulder    Order Specific Question:   Preferred imaging location?    Answer:   [facility] [facility]   DG Shoulder Left    Standing Status:   Future    Number of Occurrences:   1    Standing Expiration Date:   01/21/2024    Order Specific Question:   Reason for Exam (SYMPTOM  OR DIAGNOSIS REQUIRED)    Answer:   eval left shoulder pain    Order Specific Question:   Preferred imaging location?    Answer:   [facility]   Ambulatory referral to Physical Therapy    Referral Priority:   Routine    Referral Type:   Physical Medicine    Referral Reason:   Specialty Services Required    Requested Specialty:   Physical Therapy    Number of Visits Requested:   1   No orders of the defined types were placed  in this encounter.   Discussed warning signs or symptoms. Please see discharge instructions. Patient expresses understanding.   The above documentation has been reviewed and is accurate and complete Lynne Leader, M.D.

## 2023-01-20 ENCOUNTER — Ambulatory Visit (INDEPENDENT_AMBULATORY_CARE_PROVIDER_SITE_OTHER): Payer: 59 | Admitting: Family Medicine

## 2023-01-20 ENCOUNTER — Ambulatory Visit: Payer: Self-pay

## 2023-01-20 ENCOUNTER — Ambulatory Visit (INDEPENDENT_AMBULATORY_CARE_PROVIDER_SITE_OTHER): Payer: 59

## 2023-01-20 VITALS — BP 138/76 | HR 85 | Ht 77.0 in | Wt 320.0 lb

## 2023-01-20 DIAGNOSIS — M25522 Pain in left elbow: Secondary | ICD-10-CM

## 2023-01-20 DIAGNOSIS — M25512 Pain in left shoulder: Secondary | ICD-10-CM | POA: Diagnosis not present

## 2023-01-20 DIAGNOSIS — G5622 Lesion of ulnar nerve, left upper limb: Secondary | ICD-10-CM

## 2023-01-20 DIAGNOSIS — M25812 Other specified joint disorders, left shoulder: Secondary | ICD-10-CM | POA: Diagnosis not present

## 2023-01-20 DIAGNOSIS — G8929 Other chronic pain: Secondary | ICD-10-CM

## 2023-01-20 NOTE — Patient Instructions (Addendum)
Thank you for coming in today.   I've referred you to Physical Therapy.  Let us know if you don't hear from them in one week.   Please get an Xray today before you leave   Recheck in 6 weeks.   If not improving we can do an injection.   Let know.

## 2023-01-23 NOTE — Progress Notes (Signed)
Left shoulder x-ray looks normal to radiology

## 2023-02-01 NOTE — Addendum Note (Signed)
Addended by: Loralee Pacas on: 02/01/2023 07:58 AM   Modules accepted: Orders

## 2023-02-01 NOTE — Assessment & Plan Note (Addendum)
Referred to sleep for management  Unconfirmed

## 2023-02-02 ENCOUNTER — Encounter (HOSPITAL_COMMUNITY): Payer: Self-pay

## 2023-02-02 ENCOUNTER — Ambulatory Visit (HOSPITAL_COMMUNITY)
Admission: EM | Admit: 2023-02-02 | Discharge: 2023-02-02 | Disposition: A | Discharge status: Home / Self Care | Payer: 59 | Attending: Family Medicine | Admitting: Family Medicine

## 2023-02-02 DIAGNOSIS — R42 Dizziness and giddiness: Secondary | ICD-10-CM

## 2023-02-02 DIAGNOSIS — R110 Nausea: Secondary | ICD-10-CM

## 2023-02-02 DIAGNOSIS — H8110 Benign paroxysmal vertigo, unspecified ear: Secondary | ICD-10-CM

## 2023-02-02 DIAGNOSIS — R0989 Other specified symptoms and signs involving the circulatory and respiratory systems: Secondary | ICD-10-CM

## 2023-02-02 LAB — POC INFLUENZA A AND B ANTIGEN (URGENT CARE ONLY)
Influenza A Ag: NEGATIVE
Influenza B Ag: NEGATIVE

## 2023-02-02 MED ORDER — ONDANSETRON HCL 4 MG PO TABS: 4.0000 mg | ORAL_TABLET | Freq: Four times a day (QID) | ORAL | 0 refills | Status: DC

## 2023-02-02 MED ORDER — MECLIZINE HCL 25 MG PO TABS: 25.0000 mg | ORAL_TABLET | Freq: Three times a day (TID) | ORAL | 0 refills | Status: DC | PRN

## 2023-02-02 NOTE — ED Provider Notes (Signed)
[facility]    CSN: [id number] Arrival date & time: 02/02/23  [id number]      History   Chief Complaint Chief Complaint  Patient presents with   Dizziness   Nausea   Abdominal Pain    HPI Russell [name] is a 32 y.o. male.   Patient is here for not feeling well.  He woke this morning feeling "tired in his eyes".  He is feeling dizziness, eyes feels like they cross;   Some coughing this morning.  Also with runny nose, congestion, drainage.  No fevers/chills.  + nausea with dizziness.  No vomiting;  + diarrhea.  He used his inhaler this morning, no other meds used.  No new meds used, other than MVI.        Past Medical History:  Diagnosis Date   Allergy    Anxiety    Asthma    Asthma 01/13/2023   Depression    Left elbow pain 01/13/2023   Elbow > shoulder Radiates to numbing sensation in pinky and ring finger. Not lifting heavy stuff   Obesity     Patient Active Problem List   Diagnosis Date Noted   Asthma 01/13/2023   Left elbow pain 01/13/2023   Viral bronchitis 01/13/2023   Moderate persistent asthma with exacerbation 01/13/2023   Shoulder impingement, left 01/13/2023   Other fatigue 01/13/2023   Nausea and vomiting 01/13/2023   Weakness 01/13/2023   Sleep apnea 01/13/2023   Adjustment disorder with anxious mood 10/20/2017   Bipolar 1 disorder, depressed, moderate ([place]) 09/22/2017   Moderate episode of recurrent major depressive disorder ([place]) 09/22/2017   Family dynamics problem 09/18/2017   Self-mutilation 09/18/2017   Work environment adverse effect 09/18/2017   Bereavement reaction 09/18/2017   Closed nondisplaced fracture of shaft of fourth metacarpal bone of right hand 04/04/2017   Right hand pain 04/04/2017   GAD (generalized anxiety disorder) 12/10/2015   Tinnitus 04/09/2015   Bronchospasm 03/02/2015   Allergy 03/02/2015   BMI 36.0-36.9,adult 11/02/2014    Past Surgical History:  Procedure Laterality Date   TONSILLECTOMY      TYMPANOSTOMY TUBE PLACEMENT         Home Medications    Prior to Admission medications   Medication Sig Start Date End Date Taking? Authorizing Provider  albuterol (VENTOLIN HFA) 108 (90 Base) MCG/ACT inhaler Inhale 2 puffs into the lungs every 6 (six) hours as needed for wheezing or shortness of breath. 01/13/23  Yes Loralee Pacas, MD  EPINEPHrine (EPIPEN) 0.3 mg/0.3 mL DEVI Inject 0.3 mLs (0.3 mg total) into the muscle once. 07/11/12  Yes Georgiann Mccoy M, PA-C  famotidine-calcium carbonate-magnesium hydroxide (PEPCID COMPLETE) 10-800-165 MG chewable tablet Chew 1 tablet by mouth daily as needed. 01/13/23  Yes Loralee Pacas, MD  fluticasone [facility]) 50 MCG/ACT nasal spray Place 1-2 sprays into both nostrils daily. 10/08/20  Yes Wieters, Hallie C, PA-C  mometasone-formoterol (DULERA) 200-5 MCG/ACT AERO Inhale 2 puffs into the lungs 2 (two) times daily. 01/13/23  Yes Loralee Pacas, MD  ondansetron (ZOFRAN) 4 MG tablet Take 1 tablet (4 mg total) by mouth every 8 (eight) hours as needed for nausea or vomiting. 12/27/22  Yes Apolonio Schneiders, FNP  ondansetron (ZOFRAN-ODT) 4 MG disintegrating tablet Take 1 tablet (4 mg total) by mouth every 8 (eight) hours as needed for nausea or vomiting (for nausea from wegovy or other source). 01/13/23  Yes Loralee Pacas, MD  ibuprofen (ADVIL) 800 MG tablet Take 1 tablet (800  mg total) by mouth 3 (three) times daily. Patient not taking: Reported on 01/13/2023 10/08/20   Wieters, Hallie C, PA-C  oxiconazole (OXISTAT) 1 % CREA Apply 1 application  topically 2 (two) times daily. Patient not taking: Reported on 01/13/2023 09/08/22   Eulogio Bear, NP  Pseudoeph-Doxylamine-DM-APAP (DAYQUIL/NYQUIL COLD/FLU RELIEF PO) Take by mouth. Patient not taking: Reported on 01/13/2023    [provider]  famotidine (PEPCID) 20 MG tablet Take 1 tablet (20 mg total) by mouth 2 (two) times daily. 08/08/19 10/08/20  Jaynee Eagles, PA-C  Fluticasone-Salmeterol (ADVAIR  DISKUS) 250-50 MCG/DOSE AEPB Inhale 1 puff into the lungs 2 (two) times daily. 08/15/19 10/08/20  Chase Picket, MD  montelukast (SINGULAIR) 10 MG tablet Take 1 tablet (10 mg total) by mouth at bedtime. 03/20/20 10/08/20  Faustino Congress, NP    Family History Family History  Problem Relation Age of Onset   Hyperlipidemia Mother    Early death Mother    Hypertension Mother    Mental illness Mother    Alcohol abuse Father    Asthma Father    COPD Father    Drug abuse Father    Early death Father    Hypertension Father    Hyperlipidemia Father    Stroke Father    Heart attack Father    Mental retardation Maternal Uncle    Heart disease Maternal Grandfather     Social History Social History   Tobacco Use   Smoking status: Never   Smokeless tobacco: Current    Types: Chew  Vaping Use   Vaping Use: Every day  Substance Use Topics   Alcohol use: Yes    Comment: occasionally   Drug use: No     Allergies   Penicillins   Review of Systems Review of Systems  Constitutional:  Positive for fatigue. Negative for chills and fever.  HENT:  Positive for congestion and rhinorrhea. Negative for sore throat.   Respiratory:  Positive for cough. Negative for shortness of breath and wheezing.   Gastrointestinal:  Positive for nausea. Negative for abdominal pain and vomiting.  Skin: Negative.   Neurological:  Positive for dizziness.  Psychiatric/Behavioral: Negative.       Physical Exam Triage Vital Signs ED Triage Vitals  Enc Vitals Group     BP 02/02/23 0933 121/86     Pulse Rate 02/02/23 0933 93     Resp 02/02/23 0933 18     Temp 02/02/23 0933 98 F (36.7 C)     Temp Source 02/02/23 0933 Oral     SpO2 02/02/23 0933 97 %     Weight --      Height --      Head Circumference --      Peak Flow --      Pain Score 02/02/23 0930 0     Pain Loc --      Pain Edu? --      Excl. in [place]? --    No data found.  Updated Vital Signs BP 121/86 (BP Location: Left Arm)    Pulse 93   Temp 98 F (36.7 C) (Oral)   Resp 18   SpO2 97%   Visual Acuity Right Eye Distance:   Left Eye Distance:   Bilateral Distance:    Right Eye Near:   Left Eye Near:    Bilateral Near:     Physical Exam Constitutional:      Appearance: Normal appearance.  HENT:     Right Ear: Tympanic  membrane normal.     Left Ear: Tympanic membrane normal.     Nose: Congestion present. No rhinorrhea.     Mouth/Throat:     Mouth: Mucous membranes are moist.     Pharynx: Posterior oropharyngeal erythema present. No oropharyngeal exudate.  Cardiovascular:     Rate and Rhythm: Normal rate and regular rhythm.  Pulmonary:     Effort: Pulmonary effort is normal.  Abdominal:     Palpations: Abdomen is soft.  Musculoskeletal:        General: Normal range of motion.     Cervical back: Normal range of motion and neck supple. No tenderness.  Lymphadenopathy:     Cervical: No cervical adenopathy.  Skin:    General: Skin is warm.  Neurological:     General: No focal deficit present.     Mental Status: He is alert.     Comments: Dix[place]pike positive on the left  Psychiatric:        Mood and Affect: Mood normal.      UC Treatments / Results  Labs (all labs ordered are listed, but only abnormal results are displayed) Labs Reviewed  POC INFLUENZA A AND B ANTIGEN (URGENT CARE ONLY)    EKG   Radiology No results found.  Procedures Procedures (including critical care time)  Medications Ordered in UC Medications - No data to display  Initial Impression / Assessment and Plan / UC Course  I have reviewed the triage vital signs and the nursing notes.  Pertinent labs & imaging results that were available during my care of the patient were reviewed by me and considered in my medical decision making (see chart for details).   Final Clinical Impressions(s) / UC Diagnoses   Final diagnoses:  Dizziness  Nausea without vomiting  Symptoms of upper respiratory infection (URI)   Benign paroxysmal positional vertigo, unspecified laterality     Discharge Instructions      You were seen today for various symptoms.  You flu swab was negative.  Your symptoms are likely viral in nature.  I do not see anything alarming or bacterial in nature.  I have sent out medication for dizziness and nausea in the mean time.  I recommend you get plenty of rest and fluids.   If you are worsening then please return for re-evaluation.     ED Prescriptions     Medication Sig Dispense Auth. Provider   ondansetron (ZOFRAN) 4 MG tablet Take 1 tablet (4 mg total) by mouth every 6 (six) hours. 12 tablet Emy Angevine, MD   meclizine (ANTIVERT) 25 MG tablet Take 1 tablet (25 mg total) by mouth 3 (three) times daily as needed for dizziness. 30 tablet Rondel Oh, MD      PDMP not reviewed this encounter.   Rondel Oh, MD 02/02/23 1026

## 2023-02-02 NOTE — Discharge Instructions (Signed)
You were seen today for various symptoms.  You flu swab was negative.  Your symptoms are likely viral in nature.  I do not see anything alarming or bacterial in nature.  I have sent out medication for dizziness and nausea in the mean time.  I recommend you get plenty of rest and fluids.   If you are worsening then please return for re-evaluation.

## 2023-02-02 NOTE — ED Triage Notes (Signed)
Pt is here for cough, stomach pain , dizziness, diarrhea, nausea , low energy , SOB, runny nose, nasal congestion   x 1day

## 2023-02-02 NOTE — Therapy (Deleted)
OUTPATIENT PHYSICAL THERAPY SHOULDER EVALUATION   Patient Name: Russell [name] MRN: [id number] DOB:August 31, 1991, 32 y.o., male Today's Date: 02/02/2023  END OF SESSION:   Past Medical History:  Diagnosis Date   Allergy    Anxiety    Asthma    Asthma 01/13/2023   Depression    Left elbow pain 01/13/2023   Elbow > shoulder Radiates to numbing sensation in pinky and ring finger. Not lifting heavy stuff   Obesity    Past Surgical History:  Procedure Laterality Date   TONSILLECTOMY     TYMPANOSTOMY TUBE PLACEMENT     Patient Active Problem List   Diagnosis Date Noted   Asthma 01/13/2023   Left elbow pain 01/13/2023   Viral bronchitis 01/13/2023   Moderate persistent asthma with exacerbation 01/13/2023   Shoulder impingement, left 01/13/2023   Other fatigue 01/13/2023   Nausea and vomiting 01/13/2023   Weakness 01/13/2023   Sleep apnea 01/13/2023   Adjustment disorder with anxious mood 10/20/2017   Bipolar 1 disorder, depressed, moderate ([place]) 09/22/2017   Moderate episode of recurrent major depressive disorder ([place]) 09/22/2017   Family dynamics problem 09/18/2017   Self-mutilation 09/18/2017   Work environment adverse effect 09/18/2017   Bereavement reaction 09/18/2017   Closed nondisplaced fracture of shaft of fourth metacarpal bone of right hand 04/04/2017   Right hand pain 04/04/2017   GAD (generalized anxiety disorder) 12/10/2015   Tinnitus 04/09/2015   Bronchospasm 03/02/2015   Allergy 03/02/2015   BMI 36.0-36.9,adult 11/02/2014    PCP: Berniece Pap, MD   REFERRING PROVIDER: Gregor Hams, MD  REFERRING DIAG: [phone] (ICD-10-CM) - Chronic left shoulder pain  THERAPY DIAG:  No diagnosis found.  Rationale for Evaluation and Treatment: Rehabilitation  ONSET DATE: 1.5 years ago  SUBJECTIVE:                                                                                                                                                                                       SUBJECTIVE STATEMENT: *** Patient is a 32 year old male presenting with left arm pain 1.5 years. Pt use to deliver washers/dryers for work. Patient locates pain to the posterior aspect of the L arm from shoulder to elbow. 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   Neck pain: no Grip strength: yes Radiates: Yes- Paresthesias: yes- fingers 4-5 Aggravates: trying to sleep at night, and first thing in the morning Treatments tried: nothing PERTINENT HISTORY: ***  PAIN:  Are you having pain? Yes: NPRS scale: ***/10 Pain location: *** Pain description: *** Aggravating factors: *** Relieving factors: ***  PRECAUTIONS: {Therapy precautions:24002}  WEIGHT BEARING  RESTRICTIONS: {Yes ***/No:24003}  FALLS:  Has patient fallen in last 6 months? {fallsyesno:27318}  LIVING ENVIRONMENT: Lives with: {OPRC lives with:25569::"lives with their family"} Lives in: {Lives in:25570} Stairs: {opstairs:27293} Has following equipment at home: {Assistive devices:23999}  OCCUPATION: ***  PLOF: {PLOF:24004}  PATIENT GOALS:***  NEXT MD VISIT:   OBJECTIVE:   DIAGNOSTIC FINDINGS:  oNrmal left shoulder radiographs.  PATIENT SURVEYS:  {rehab surveys:24030:a}  COGNITION: Overall cognitive status: {cognition:24006}     SENSATION: {sensation:27233}  POSTURE: ***    UE Measurements Upper Extremity Right 02/02/2023 Left 02/02/2023   A/PROM MMT A/PROM MMT  Shoulder Flexion      Shoulder Extension      Shoulder Abduction      Shoulder Adduction      Shoulder Internal Rotation      Shoulder External Rotation      Elbow Flexion      Elbow Extension      Wrist Flexion      Wrist Extension      Wrist Supination      Wrist Pronation      Wrist Ulnar Deviation      Wrist Radial Deviation      Grip Strength NA  NA     (Blank rows = not tested)   * pain   SHOULDER SPECIAL TESTS: Impingement tests: {shoulder  impingement test:25231:a} SLAP lesions: {SLAP lesions:25232} Instability tests: {shoulder instability test:25233} Rotator cuff assessment: {rotator cuff assessment:25234} Biceps assessment: {biceps assessment:25235}  JOINT MOBILITY TESTING:  ***  PALPATION:  ***   TODAY'S TREATMENT:                                                                                                                                         DATE: *** 02/02/2023  Therapeutic Exercise:  Aerobic: Supine: Prone:  Seated:  Standing: Neuromuscular Re-education: Manual Therapy: Therapeutic Activity: Self Care: Trigger Point Dry Needling:  Modalities:   PATIENT EDUCATION:  Education details: on current presentation, on HEP, on clinical outcomes score and POC Person educated: Patient Education method: Explanation, Demonstration, and Handouts Education comprehension: verbalized understanding   HOME EXERCISE PROGRAM: ***  ASSESSMENT:  CLINICAL IMPRESSION: Patient is a *** y.o. *** who was seen today for physical therapy evaluation and treatment for ***.   OBJECTIVE IMPAIRMENTS: {opptimpairments:25111}.   ACTIVITY LIMITATIONS: {activitylimitations:27494}  PARTICIPATION LIMITATIONS: {participationrestrictions:25113}  PERSONAL FACTORS: {Personal factors:25162} are also affecting patient's functional outcome.   REHAB POTENTIAL: {rehabpotential:25112}  CLINICAL DECISION MAKING: {clinical decision making:25114}  EVALUATION COMPLEXITY: {Evaluation complexity:25115}   GOALS: Goals reviewed with patient? yes  SHORT TERM GOALS: Target date: {follow up:25551}  Patient will be independent in self management strategies to improve quality of life and functional outcomes. Baseline: New Program Goal status: INITIAL  2.  Patient will report at least 50% improvement in overall symptoms and/or function to demonstrate improved functional mobility Baseline: 0% better Goal status: INITIAL  3.   ***  Baseline:  Goal status: INITIAL  4.  *** Baseline:  Goal status: INITIAL    LONG TERM GOALS: Target date: {follow up:25551}   Patient will report at least 75% improvement in overall symptoms and/or function to demonstrate improved functional mobility Baseline: 0% better Goal status: INITIAL  2.  Patient will improve score on FOTO outcomes measure to projected score to demonstrate overall improved function and QOL Baseline: see above Goal status: INITIAL  3.  *** Baseline:  Goal status: INITIAL  4.  *** Baseline:  Goal status: INITIAL   PLAN:  PT FREQUENCY: {rehab frequency:25116}  PT DURATION: {rehab duration:25117}  PLANNED INTERVENTIONS: Therapeutic exercises, Therapeutic activity, Neuromuscular re-education, Balance training, Gait training, Patient/Family education, Self Care, Joint mobilization, Joint manipulation, Vestibular training, Orthotic/Fit training, DME instructions, Aquatic Therapy, Dry Needling, Electrical stimulation, Spinal manipulation, Spinal mobilization, Cryotherapy, Moist heat, Traction, Ultrasound, Ionotophoresis 26m/ml Dexamethasone, Manual therapy, and Re-evaluation  PLAN FOR NEXT SESSION: ***   12:47 PM, 02/02/23 MJerene Pitch DPT Physical Therapy with [facility]

## 2023-02-06 ENCOUNTER — Ambulatory Visit: Payer: 59 | Admitting: Physical Therapy

## 2023-02-08 ENCOUNTER — Telehealth: Payer: Self-pay | Admitting: Internal Medicine

## 2023-02-08 NOTE — Telephone Encounter (Signed)
Patient states his insurance will not cover mometasone-formoterol (DULERA) 200-5 MCG/ACT AERO .  Patient requests a RX for an alternative to the above medication be sent to  [id number] [place] [place] [address]: [phone] Fax: [phone]

## 2023-02-14 ENCOUNTER — Encounter: Payer: Self-pay | Admitting: Physical Therapy

## 2023-02-14 ENCOUNTER — Ambulatory Visit (INDEPENDENT_AMBULATORY_CARE_PROVIDER_SITE_OTHER): Payer: 59 | Admitting: Physical Therapy

## 2023-02-14 DIAGNOSIS — M6281 Muscle weakness (generalized): Secondary | ICD-10-CM

## 2023-02-14 DIAGNOSIS — M25512 Pain in left shoulder: Secondary | ICD-10-CM | POA: Diagnosis not present

## 2023-02-14 DIAGNOSIS — G8929 Other chronic pain: Secondary | ICD-10-CM

## 2023-02-14 NOTE — Therapy (Signed)
OUTPATIENT PHYSICAL THERAPY SHOULDER EVALUATION   Patient Name: Russell [name] MRN: [id number] DOB:1991/07/16, 32 y.o., male Today's Date: 02/14/2023  END OF SESSION:  PT End of Session - 02/14/23 1510     Visit Number 1    Number of Visits 8    Date for PT Re-Evaluation 04/11/23    Authorization Type [employer]    PT Start Time [id number]    PT Stop Time 1554    PT Time Calculation (min) 39 min             Past Medical History:  Diagnosis Date   Allergy    Anxiety    Asthma    Asthma 01/13/2023   Depression    Left elbow pain 01/13/2023   Elbow > shoulder Radiates to numbing sensation in pinky and ring finger. Not lifting heavy stuff   Obesity    Past Surgical History:  Procedure Laterality Date   TONSILLECTOMY     TYMPANOSTOMY TUBE PLACEMENT     Patient Active Problem List   Diagnosis Date Noted   Asthma 01/13/2023   Left elbow pain 01/13/2023   Shoulder impingement, left 01/13/2023   Other fatigue 01/13/2023   Nausea and vomiting 01/13/2023   Weakness 01/13/2023   Sleep apnea 01/13/2023   Adjustment disorder with anxious mood 10/20/2017   Bipolar 1 disorder, depressed, moderate ([place]) 09/22/2017   Family dynamics problem 09/18/2017   Self-mutilation 09/18/2017   Work environment adverse effect 09/18/2017   Bereavement reaction 09/18/2017   Closed nondisplaced fracture of shaft of fourth metacarpal bone of right hand 04/04/2017   Right hand pain 04/04/2017   GAD (generalized anxiety disorder) 12/10/2015   Tinnitus 04/09/2015   Allergy 03/02/2015   BMI 36.0-36.9,adult 11/02/2014    PCP: Berniece Pap, MD   REFERRING PROVIDER: Gregor Hams, MD  REFERRING DIAG: [phone] (ICD-10-CM) - Chronic left shoulder pain  THERAPY DIAG:  Chronic left shoulder pain  Muscle weakness (generalized)  Rationale for Evaluation and Treatment: Rehabilitation  ONSET DATE: 4 years ago  SUBJECTIVE:                                                                                                                                                                                       SUBJECTIVE STATEMENT: States that carrying 10 pounds increases pain in his hand and elbow and in his arm and sometimes his hand will go numb in the last 2 fingers.   States he has pain in his arm and it wakes him up 1x. Reports pain is throbbing, sometimes he sleeps on it but has changed that and it still hurts. Report that he has tried OTC  pain meds and gentle stretches. 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    States that his arm feels weak when he is in a lot of pain. States he has a 43 month old at home that likes to play. States he games on the [facility] during the weekend and feels he has good posture.    PERTINENT HISTORY: N/A   PAIN:  Are you having pain? Yes: NPRS scale: 3/10 Pain location: left arm -entire Pain description: achy, throbbing Aggravating factors: using, sleeping on it Relieving factors: nothing  PRECAUTIONS: None  WEIGHT BEARING RESTRICTIONS: No  FALLS:  Has patient fallen in last 6 months? No    OCCUPATION: Maintenance tech  PLOF: Independent  PATIENT GOALS:to have less pain    OBJECTIVE:   DIAGNOSTIC FINDINGS:  Normal left shoulder radiographs.   COGNITION: Overall cognitive status: Within functional limits for tasks assessed       POSTURE: Rounded shoulders, forward head, sacral sitting.   cervical A/PROM:    02/14/2023      Flexion 50% limited*      Extension  20% limited     R ROT  25% limited     L ROT  50% limited     R SB 25% limited *     L SB 25% limited*     * Pain In neck and shoulder   (Blank rows = not tested)    UE Measurements Upper Extremity Right 02/14/2023 Left 02/14/2023   A/PROM MMT A/PROM MMT  Shoulder Flexion WFL 4 WFL 3+*  Shoulder Extension      Shoulder Abduction WFL 4+ WFL  3+*  Shoulder Adduction      Shoulder  Internal Rotation Reaches to T12 SP 4+ Reaches to T6 SP* 3+*  Shoulder External Rotation Reaches to C7 SP 4 Reaches to C7 SP* 4  Elbow Flexion      Elbow Extension      Wrist Flexion      Wrist Extension      Wrist Supination      Wrist Pronation      Wrist Ulnar Deviation      Wrist Radial Deviation      Grip Strength NA  NA     (Blank rows = not tested)   * pain  Right handed    PALPATION:  Tenderness to palpation along B UT, left anterior shoulder, biceps and wrist extensors.   TODAY'S TREATMENT:                                                                                                                                         DATE:  02/14/2023  Therapeutic Exercise:  Aerobic: Supine: Prone:  Seated:  Standing: shoulder flexion up wall with pillow case x15  Neuromuscular Re-education:sitting on sit bones not on  tail bone -5 minutes Manual Therapy: Therapeutic Activity: Self Care: Trigger Point Dry Needling:  Modalities:   PATIENT EDUCATION:  Education details: on current presentation, on HEP, on posture and anatomy and POC, on sleep postures with patient and semileft side-lying with pillow behind Person educated: Patient Education method: Explanation, Demonstration, and Handouts Education comprehension: verbalized understanding   HOME EXERCISE PROGRAM: [id number]  ASSESSMENT:  CLINICAL IMPRESSION: Patient presents to physical therapy with chronic shoulder pain.  Patient presents weakness and range of motion limitations as well as postural deficits likely contributing to current condition.  Session focused on education as well as establishing home exercise program.  Tolerated all interventions well with no pain during session.  Patient would greatly benefit from skilled physical therapy to improve overall function and use of left upper extremity.  OBJECTIVE IMPAIRMENTS: decreased activity tolerance, decreased ROM, decreased strength, impaired UE functional use,  postural dysfunction, and pain.   ACTIVITY LIMITATIONS: carrying, lifting, reach over head, and hygiene/grooming  PARTICIPATION LIMITATIONS: occupation  PERSONAL FACTORS: Fitness and Time since onset of injury/illness/exacerbation are also affecting patient's functional outcome.   REHAB POTENTIAL: Good  CLINICAL DECISION MAKING: Stable/uncomplicated  EVALUATION COMPLEXITY: Low   GOALS: Goals reviewed with patient? yes  SHORT TERM GOALS: Target date: 03/14/2023  Patient will be independent in self management strategies to improve quality of life and functional outcomes. Baseline: New Program Goal status: INITIAL  2.  Patient will report at least 50% improvement in overall symptoms and/or function to demonstrate improved functional mobility Baseline: 0% better Goal status: INITIAL  3.  Patient will demonstrate pain-free left upper extremity range of motion in all directions Baseline: Painful Goal status: INITIAL    LONG TERM GOALS: Target date: 04/11/2023   Patient will report at least 75% improvement in overall symptoms and/or function to demonstrate improved functional mobility Baseline: 0% better Goal status: INITIAL  2.  Patient will demonstrate at least 4 out of 5 MMT strength in bilateral upper extremities Baseline: see above Goal status: INITIAL  3.  Patient will report not waking up at night secondary to pain in left arm Baseline: Currently waking up 1 time at night due to pain in left arm Goal status: INITIAL     PLAN:  PT FREQUENCY: 1x/week  PT DURATION: 8 weeks  PLANNED INTERVENTIONS: Therapeutic exercises, Therapeutic activity, Neuromuscular re-education, Balance training, Gait training, Patient/Family education, Self Care, Joint mobilization, Joint manipulation, Vestibular training, Orthotic/Fit training, DME instructions, Aquatic Therapy, Dry Needling, Electrical stimulation, Spinal manipulation, Spinal mobilization, Cryotherapy, Moist heat, Traction,  Ultrasound, Ionotophoresis '4mg'$ /ml Dexamethasone, Manual therapy, and Re-evaluation  PLAN FOR NEXT SESSION: pec stretches, shoulder stability, posture   4:03 PM, 02/14/23 Jerene Pitch, DPT Physical Therapy with [facility]

## 2023-02-16 ENCOUNTER — Encounter: Payer: Self-pay | Admitting: Neurology

## 2023-02-16 ENCOUNTER — Ambulatory Visit (INDEPENDENT_AMBULATORY_CARE_PROVIDER_SITE_OTHER): Payer: 59 | Admitting: Neurology

## 2023-02-16 VITALS — BP 143/93 | HR 77 | Ht 77.0 in | Wt 307.4 lb

## 2023-02-16 DIAGNOSIS — R0683 Snoring: Secondary | ICD-10-CM | POA: Diagnosis not present

## 2023-02-16 DIAGNOSIS — R5383 Other fatigue: Secondary | ICD-10-CM

## 2023-02-16 DIAGNOSIS — Z6836 Body mass index (BMI) 36.0-36.9, adult: Secondary | ICD-10-CM

## 2023-02-16 DIAGNOSIS — F411 Generalized anxiety disorder: Secondary | ICD-10-CM

## 2023-02-16 DIAGNOSIS — R0602 Shortness of breath: Secondary | ICD-10-CM

## 2023-02-16 DIAGNOSIS — G4719 Other hypersomnia: Secondary | ICD-10-CM | POA: Diagnosis not present

## 2023-02-16 DIAGNOSIS — F3132 Bipolar disorder, current episode depressed, moderate: Secondary | ICD-10-CM

## 2023-02-16 NOTE — Patient Instructions (Signed)
Fatigue If you have fatigue, you feel tired all the time and have a lack of energy or a lack of motivation. Fatigue may make it difficult to start or complete tasks because of exhaustion. Occasional or mild fatigue is often a normal response to activity or life. However, long-term (chronic) or extreme fatigue may be a symptom of a medical condition such as: Depression. Not having enough red blood cells or hemoglobin in the blood (anemia). A problem with a small gland located in the lower front part of the neck (thyroid disorder). Rheumatologic conditions. These are problems related to the body's defense system (immune system). Infections, especially certain viral infections. Fatigue can also lead to negative health outcomes over time. Follow these instructions at home: Medicines Take over-the-counter and prescription medicines only as told by your health care provider. Take a multivitamin if told by your health care provider. Do not use herbal or dietary supplements unless they are approved by your health care provider. Eating and drinking  Avoid heavy meals in the evening. Eat a well-balanced diet, which includes lean proteins, whole grains, plenty of fruits and vegetables, and low-fat dairy products. Avoid eating or drinking too many products with caffeine in them. Avoid alcohol. Drink enough fluid to keep your urine pale yellow. Activity  Exercise regularly, as told by your health care provider. Use or practice techniques to help you relax, such as yoga, tai chi, meditation, or massage therapy. Lifestyle Change situations that cause you stress. Try to keep your work and personal schedules in balance. Do not use recreational or illegal drugs. General instructions Monitor your fatigue for any changes. Go to bed and get up at the same time every day. Avoid fatigue by pacing yourself during the day and getting enough sleep at night. Maintain a healthy weight. Contact a health care  provider if: Your fatigue does not get better. You have a fever. You suddenly lose or gain weight. You have headaches. You have trouble falling asleep or sleeping through the night. You feel angry, guilty, anxious, or sad. You have swelling in your legs or another part of your body. Get help right away if: You feel confused, feel like you might faint, or faint. Your vision is blurry or you have a severe headache. You have severe pain in your abdomen, your back, or the area between your waist and hips (pelvis). You have chest pain, shortness of breath, or an irregular or fast heartbeat. You are unable to urinate, or you urinate less than normal. You have abnormal bleeding from the rectum, nose, lungs, nipples, or, if you are male, the vagina. You vomit blood. You have thoughts about hurting yourself or others. These symptoms may be an emergency. Get help right away. Call 911. Do not wait to see if the symptoms will go away. Do not drive yourself to the hospital. Get help right away if you feel like you may hurt yourself or others, or have thoughts about taking your own life. Go to your nearest emergency room or: Call 911. Call the [facility] at [phone] or 988. This is open 24 hours a day. Text the Crisis Text Line at 979-823-0385. Summary If you have fatigue, you feel tired all the time and have a lack of energy or a lack of motivation. Fatigue may make it difficult to start or complete tasks because of exhaustion. Long-term (chronic) or extreme fatigue may be a symptom of a medical condition. Exercise regularly, as told by your health care provider.  Change situations that cause you stress. Try to keep your work and personal schedules in balance. This information is not intended to replace advice given to you by your health care provider. Make sure you discuss any questions you have with your health care provider. Document Revised: 09/27/2021 Document  Reviewed: 09/27/2021 Elsevier Patient Education  [place]. Hypersomnia Hypersomnia is a condition in which a person feels very tired during the day even though the person gets plenty of sleep at night. A person with this condition may take naps during the day and may find it very difficult to wake up from sleep. Hypersomnia may affect a person's ability to think, concentrate, drive, or remember things. What are the causes? The cause of this condition may not be known. Possible causes include: Taking certain medicines. Using drugs or alcohol. Sleep disorders, such as narcolepsy and sleep apnea. Injury to the head, brain, or spinal cord. Tumors. Certain medical conditions. These include: Depression. Diabetes. Gastroesophageal reflux disease (GERD). An underactive thyroid gland (hypothyroidism). What are the signs or symptoms? The main symptoms of hypersomnia include: Feeling very tired throughout the day, regardless of how much sleep you got the night before. Having trouble waking up. Others may find it difficult to wake you up when you are sleeping. Sleeping for longer and longer periods at a time. Taking naps throughout the day. Other symptoms may include: Feeling restless, anxious, or annoyed. Lacking energy. Having trouble with: Remembering. Speaking. Thinking. Loss of appetite. Seeing, hearing, tasting, smelling, or feeling things that are not real (hallucinations). How is this diagnosed? This condition may be diagnosed based on: Your symptoms and medical history. Your sleeping habits. Your health care provider may ask you to write down your sleeping habits in a daily sleep log, along with any symptoms you have. A series of tests that are done while you sleep (sleep study or polysomnogram). A test that measures how quickly you can fall asleep during the day (daytime nap study or multiple sleep latency test). How is this treated? This condition may be treated  by: Following a regular sleep routine. Making lifestyle changes, such as changing your eating habits, getting regular exercise, and avoiding alcohol or caffeinated beverages. Taking medicines to make you more alert (stimulants) during the day. Treating any underlying medical causes of hypersomnia. Follow these instructions at home: Sleep habits Stick to a routine that includes going to bed and waking up at the same times every day and night. Practice a relaxing bedtime routine. This may include reading, meditation, deep breathing, or taking a warm bath before going to sleep. Exercise regularly as told by your health care provider. However, avoid exercising in the hours right before bedtime. Keep your sleep environment at a cooler temperature, darkened, and quiet. Sleep with pillows and a mattress that are comfortable and supportive. Schedule short 20-minute naps for when you feel sleepiest during the day. Talk with your employer or teachers about your hypersomnia. If possible, adjust your schedule so that: You have a regular daytime work schedule. You can take a scheduled nap during the day. You do not have to work or be active at night. Do not eat a heavy meal for a few hours before bedtime. Eat your meals at about the same times every day. Safety  Do not drive or use machinery if you are sleepy. Ask your health care provider if it is safe for you to drive. Wear a life jacket when swimming or spending time near water. General instructions  Take over-the-counter and prescription medicines only as told by your health care provider. This includes supplements. Avoid drinking alcohol or caffeinated beverages. Keep a sleep log that will help your health care provider manage your condition. This may include information about: What time you go to bed each night. How often you wake up at night. How many hours you sleep at night. How often and for how long you nap during the day. Any  observations from others, such as leg movements during sleep, sleep walking, or snoring. Keep all follow-up visits. This is important. Contact a health care provider if: You have new symptoms. Your symptoms get worse. Get help right away if: You have thoughts about hurting yourself or someone else. Get help right away if you feel like you may hurt yourself or others, or have thoughts about taking your own life. Go to your nearest emergency room or: Call 911. Call the [facility] at ([phone] or 988. This is open 24 hours a day. Text the Crisis Text Line at 929-430-2613. Summary Hypersomnia refers to a condition in which you feel very tired during the day even though you get plenty of sleep at night. A person with this condition may take naps during the day and may find it very difficult to wake up from sleep. Hypersomnia may affect a person's ability to think, concentrate, drive, or remember things. Treatment may include a regular sleep routine and making some lifestyle changes. This information is not intended to replace advice given to you by your health care provider. Make sure you discuss any questions you have with your health care provider. Document Revised: 11/15/2021 Document Reviewed: 11/15/2021 Elsevier Patient Education  [place] for Sleep Apnea  Sleep apnea is a condition in which breathing pauses or becomes shallow during sleep. Sleep apnea screening is a test to determine if you are at risk for sleep apnea. The test includes a series of questions. It will only takes a few minutes. Your health care provider may ask you to have this test in preparation for surgery or as part of a physical exam. What are the symptoms of sleep apnea? Common symptoms of sleep apnea include: Snoring. Waking up often at night. Daytime sleepiness. Pauses in breathing. Choking or gasping during sleep. Irritability. Forgetfulness. Trouble thinking  clearly. Depression. Personality changes. Most people with sleep apnea do not know that they have it. What are the advantages of sleep apnea screening? Getting screened for sleep apnea can help: Ensure your safety. It is important for your health care providers to know whether or not you have sleep apnea, especially if you are having surgery or have other long-term (chronic) health conditions. Improve your health and allow you to get a better night's rest. Restful sleep can help you: Have more energy. Lose weight. Improve high blood pressure. Improve diabetes management. Prevent stroke. Prevent car accidents. What happens during the screening? Screening usually includes being asked a list of questions about your sleep quality. Some questions you may be asked include: Do you snore? Is your sleep restless? Do you have daytime sleepiness? Has a partner or spouse told you that you stop breathing during sleep? Have you had trouble concentrating or memory loss? What is your age? What is your neck circumference? To measure your neck, keep your back straight and gently wrap the tape measure around your neck. Put the tape measure at the middle of your neck, between your chin and collarbone. What is your  sex assigned at birth? Do you have or are you being treated for high blood pressure? If your screening test is positive, you are at risk for the condition. Further testing may be needed to confirm a diagnosis of sleep apnea. Where to find more information You can find screening tools online or at your health care clinic. For more information about sleep apnea screening and healthy sleep, visit these websites: Centers for Disease Control and Prevention: http://www.wolf.info/ American Sleep Apnea Association: www.sleepapnea.org Contact a health care provider if: You think that you may have sleep apnea. Summary Sleep apnea screening can help determine if you are at risk for sleep apnea. It is important  for your health care providers to know whether or not you have sleep apnea, especially if you are having surgery or have other chronic health conditions. You may be asked to take a screening test for sleep apnea in preparation for surgery or as part of a physical exam. This information is not intended to replace advice given to you by your health care provider. Make sure you discuss any questions you have with your health care provider. Document Revised: 11/13/2020 Document Reviewed: 11/13/2020 Elsevier Patient Education  [place].

## 2023-02-16 NOTE — Progress Notes (Signed)
SLEEP MEDICINE CLINIC    Provider:  Larey Seat, MD  Primary Care Physician:  Loralee Pacas, [place] [address]     Referring Provider:   Loralee Pacas, [address] [place],  [place] [address]          Chief Complaint according to patient   Patient presents with:     New Patient (Initial Visit)           HISTORY OF PRESENT ILLNESS:   02-16-2023 Russell [name] is a 32 y.o. male patient who is seen upon referral on 02/16/2023 from PCP DR. Berniece Pap, MD  for a sleep consultation based on fatigue and non refreshing sleep.  Chief concern according to patient :  " non restorative sleep, sometimes sleeping 12 hours and waking up more tired. Average 8 hours of sleep, waking from SOB, uses inhaler. Sleep study was suggested in his youth, but never went through. Asthma  since age 52. "    I have the pleasure of seeing Russell [name] 02/16/23 a right -handed male with a possible sleep disorder.     Sleep relevant medical history: No Nocturia, dreamless sleep, SOB with asthma at night. No Sleep walking, Tonsillectomy age 23, tubes in both ears,  nasal congestion. Obesity, alcohol abuse history.    Family medical /sleep history: MGF  with OSA, parents were never tested.    Social history:  Only child, born and raised in [place]. Patient is working as Air traffic controller and lives in a household with wife and 4 children, 2 children living at home-  The patient currently works daytime but is on-call.  Pets : 3 dogs and 6 hamsters.  Tobacco use: vapor.   ETOH use ; very rare- quit drinking 10-2022.  Caffeine intake in form of Coffee( /) Soda( 4 liters per week) Tea ( 4 gallons /week) no energy drinks Exercise in form of walking at work. .        Sleep habits are as follows: The patient's dinner time is between 6 PM. The patient goes to bed at 10 PM and continues to sleep for 8 hours, no bathroom breaks or once at at night. The preferred  sleep position is left lateral, hampered by shoulder and arm pain.  with the support of 2 pillows.  Dreams are reportedly rare.  The patient wakes up with an alarm. 6.30  AM is the usual rise time. He reports not feeling refreshed or restored in AM, with symptoms such as dry mouth, morning headaches, and residual fatigue.  Naps are taken frequently, lasting from 60-180 minutes on weekends.   Review of Systems: Out of a complete 14 system review, the patient complains of only the following symptoms, and all other reviewed systems are negative.:    Sleepy in spite 8 hours of sleep. Not RLS, but Myoclonus. 2 incidents of sleep paralysis.   Fatigue, sleepiness , snoring loudly-( wife recorded him) SOB with asthma.  pain causes fragmented sleep,  How likely are you to doze in the following situations: 0 = not likely, 1 = slight chance, 2 = moderate chance, 3 = high chance   Sitting and Reading? Watching Television? Sitting inactive in a public place (theater or meeting)? As a passenger in a car for an hour without a break? Lying down in the afternoon when circumstances permit? Sitting and talking to someone? Sitting quietly after lunch without alcohol? In a car, while stopped  for a few minutes in traffic?   Total = 14/ 24 points   FSS endorsed at 49/ 63 points.  HIGH fatigue   Social History   Socioeconomic History   Marital status: Significant Other    Spouse name: Not on file   Number of children: Not on file   Years of education: Not on file   Highest education level: Not on file  Occupational History   Not on file  Tobacco Use   Smoking status: Never   Smokeless tobacco: Current    Types: Chew  Vaping Use   Vaping Use: Every day  Substance and Sexual Activity   Alcohol use: Yes    Comment: occasionally   Drug use: No   Sexual activity: Yes  Other Topics Concern   Not on file  Social History Narrative   Not on file   Social Determinants of Health   Financial  Resource Strain: Not on file  Food Insecurity: Not on file  Transportation Needs: Not on file  Physical Activity: Not on file  Stress: Not on file  Social Connections: Not on file    Family History  Problem Relation Age of Onset   Hyperlipidemia Mother    Early death Mother    Hypertension Mother    Mental illness Mother    Alcohol abuse Father    Asthma Father    COPD Father    Drug abuse Father    Early death Father    Hypertension Father    Hyperlipidemia Father    Stroke Father    Heart attack Father    Mental retardation Maternal Uncle    Heart disease Maternal Grandfather     Past Medical History:  Diagnosis Date   Allergy    Anxiety    Asthma    Asthma 01/13/2023   Depression    Left elbow pain 01/13/2023   Elbow > shoulder Radiates to numbing sensation in pinky and ring finger. Not lifting heavy stuff   Obesity     Past Surgical History:  Procedure Laterality Date   TONSILLECTOMY     TYMPANOSTOMY TUBE PLACEMENT       Current Outpatient Medications on File Prior to Visit  Medication Sig Dispense Refill   albuterol (VENTOLIN HFA) 108 (90 Base) MCG/ACT inhaler Inhale 2 puffs into the lungs every 6 (six) hours as needed for wheezing or shortness of breath. 8 g 11   EPINEPHrine (EPIPEN) 0.3 mg/0.3 mL DEVI Inject 0.3 mLs (0.3 mg total) into the muscle once. 1 Device 0   famotidine-calcium carbonate-magnesium hydroxide (PEPCID COMPLETE) 10-800-165 MG chewable tablet Chew 1 tablet by mouth daily as needed. 100 tablet 11   fluticasone (FLONASE) 50 MCG/ACT nasal spray Place 1-2 sprays into both nostrils daily. 16 g 0   meclizine (ANTIVERT) 25 MG tablet Take 1 tablet (25 mg total) by mouth 3 (three) times daily as needed for dizziness. 30 tablet 0   mometasone-formoterol (DULERA) 200-5 MCG/ACT AERO Inhale 2 puffs into the lungs 2 (two) times daily. 13 g 3   ondansetron (ZOFRAN) 4 MG tablet Take 1 tablet (4 mg total) by mouth every 6 (six) hours. 12 tablet 0    ibuprofen (ADVIL) 800 MG tablet Take 1 tablet (800 mg total) by mouth 3 (three) times daily. (Patient not taking: Reported on 01/13/2023) 21 tablet 0   oxiconazole (OXISTAT) 1 % CREA Apply 1 application  topically 2 (two) times daily. (Patient not taking: Reported on 01/13/2023) 30 g 0   [  DISCONTINUED] famotidine (PEPCID) 20 MG tablet Take 1 tablet (20 mg total) by mouth 2 (two) times daily. 60 tablet 0   [DISCONTINUED] Fluticasone-Salmeterol (ADVAIR DISKUS) 250-50 MCG/DOSE AEPB Inhale 1 puff into the lungs 2 (two) times daily. 60 each 0   [DISCONTINUED] montelukast (SINGULAIR) 10 MG tablet Take 1 tablet (10 mg total) by mouth at bedtime. 30 tablet 2   No current facility-administered medications on file prior to visit.    Allergies  Allergen Reactions   Penicillins     Unknown     DIAGNOSTIC DATA (LABS, IMAGING, TESTING) - I reviewed patient records, labs, notes, testing and imaging myself where available.  Lab Results  Component Value Date   WBC 6.6 01/13/2023   HGB 14.8 01/13/2023   HCT 42.0 01/13/2023   MCV 81.0 01/13/2023   PLT 245.0 01/13/2023      Component Value Date/Time   NA 140 01/13/2023 1042   K 4.1 01/13/2023 1042   CL 107 01/13/2023 1042   CO2 24 01/13/2023 1042   GLUCOSE 94 01/13/2023 1042   BUN 13 01/13/2023 1042   CREATININE 0.90 01/13/2023 1042   CALCIUM 9.0 01/13/2023 1042   PROT 6.9 01/13/2023 1042   ALBUMIN 4.6 01/13/2023 1042   AST 15 01/13/2023 1042   ALT 26 01/13/2023 1042   ALKPHOS 41 01/13/2023 1042   BILITOT 1.1 01/13/2023 1042   No results found for: "CHOL", "HDL", "LDLCALC", "LDLDIRECT", "TRIG", "CHOLHDL" Lab Results  Component Value Date   HGBA1C 4.5 (L) 01/13/2023   Lab Results  Component Value Date   VITAMINB12 152 (L) 01/13/2023   Lab Results  Component Value Date   TSH 0.78 01/13/2023    PHYSICAL EXAM:  Today's Vitals   02/16/23 1128  BP: (!) 143/93  Pulse: 77  Weight: (!) 307 lb 6.4 oz (139.4 kg)  Height: '6\' 5"'$  (1.956  m)   Body mass index is 36.45 kg/m.   Wt Readings from Last 3 Encounters:  02/16/23 (!) 307 lb 6.4 oz (139.4 kg)  01/20/23 (!) 320 lb (145.2 kg)  01/13/23 (!) 320 lb 3.2 oz (145.2 kg)     Ht Readings from Last 3 Encounters:  02/16/23 '6\' 5"'$  (1.956 m)  01/20/23 '6\' 5"'$  (1.956 m)  01/13/23 '6\' 5"'$  (1.956 m)      General: The patient is awake, alert and appears not in acute distress. The patient is cooperative. Head: Normocephalic, atraumatic. Neck is supple.  Mallampati 1,  neck circumference:20 inches . Nasal airflow not fully patent.   prognathia is seen.  Dental status: biological irregular. Crowded.   Cardiovascular:  Regular rate and cardiac rhythm by pulse,  without distended neck veins. Respiratory: Lungs are clear to auscultation.  Skin:  Without evidence of ankle edema, or rash. Trunk: The patient's posture is erect.   NEUROLOGIC EXAM: The patient is awake and alert, oriented to place and time.   Memory subjective described as intact.  Attention span & concentration ability appears normal.  Speech is fluent,  without  dysarthria, dysphonia or aphasia.  Mood and affect are appropriate.   Cranial nerves: no loss of smell or taste reported  Pupils are equal and briskly reactive to light. Funduscopic exam deferred. .  Extraocular movements in vertical and horizontal planes were intact and without nystagmus. No Diplopia. Visual fields by finger perimetry are intact. Hearing was intact to soft voice and finger rubbing.    Facial sensation intact to fine touch.  Facial motor strength is symmetric and tongue and  uvula move midline.  Neck ROM : rotation, tilt and flexion extension were normal for age and shoulder shrug was symmetrical.    Motor exam:  Symmetric bulk, tone and ROM.  Reported change in penmanship.  Normal tone without cog wheeling, symmetric grip strength- tremulous while providing max grip strength.  .   Sensory:  Fine touch  and vibration were tested  and   normal.  Proprioception tested in the upper extremities was normal.   Coordination: Rapid alternating movements in the fingers/hands were of normal speed.  The Finger-to-nose maneuver was intact without evidence of ataxia, dysmetria but action tremor.   Gait and station: Patient could rise unassisted from a seated position, walked without assistive device.  Stance is of normal width/ base.  Toe and heel walk were deferred.  Deep tendon reflexes: in the  upper and lower extremities are not elicited.  Babinski response was deferred /.    ASSESSMENT AND PLAN 32 y.o. 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  Sleepier since childhood.    Fatigue can be related to depression, but he has not received treatment recently. Mind is often racing, delayed sleep onset- can be related to bipolar condition.   Chronic pain in shoulder and arm has affected sleep position and quality as well. He ends up sleeping more often supine - with a higher risk of OSA.   2)  Asthma and nocturnal SOB spells can also indicate apnea.reports GERD too- this can cause another form of apnea as well, Apnea  due to acid reflux can be present here.   Loud snoring was reported.   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 Not anemic.  Vit B 12 is important for energy , as is vit D. He just started OTC supplements.    My Goal is to check for  organic causes of sleepiness, EDS and fatigue by screening for sleep apnea, hypoxia . I will order a HST as it is likely to be available in a shorter time. If no apnea is found , return for a sleep study in lab.  If apnea is found and treated , but the patient does not gain benefit from it, will evaluate for Narcolepsy versus idiopathic hypersomnia. 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    I plan to follow up either personally or through our NP within 4-5 months.   I would like to thank Loralee Pacas, MD and Loralee Pacas, [address] [place],  [place] [address] for allowing me to meet with and to take care of this pleasant patient.  After spending a total time of  45  minutes face to face and additional time for physical and neurologic examination, review of laboratory studies,  personal review of imaging studies, reports and results of other testing and review of referral information / records as far as provided in visit,   Electronically signed by: Larey Seat, MD 02/16/2023 11:50 AM  [facility] and [facility] Sleep Board certified by The [employer] of Sleep Medicine and Diplomate of the [employer] of Sleep Medicine. Board certified In Neurology through the [employer], Fellow of the [employer] of Neurology. Medical Director of [employer].

## 2023-02-22 ENCOUNTER — Ambulatory Visit: Payer: 59 | Admitting: Neurology

## 2023-02-22 DIAGNOSIS — R5383 Other fatigue: Secondary | ICD-10-CM

## 2023-02-22 DIAGNOSIS — F3132 Bipolar disorder, current episode depressed, moderate: Secondary | ICD-10-CM

## 2023-02-22 DIAGNOSIS — G4719 Other hypersomnia: Secondary | ICD-10-CM

## 2023-02-22 DIAGNOSIS — G4710 Hypersomnia, unspecified: Secondary | ICD-10-CM | POA: Diagnosis not present

## 2023-02-22 DIAGNOSIS — R0683 Snoring: Secondary | ICD-10-CM

## 2023-02-22 DIAGNOSIS — Z6836 Body mass index (BMI) 36.0-36.9, adult: Secondary | ICD-10-CM

## 2023-02-22 DIAGNOSIS — R0602 Shortness of breath: Secondary | ICD-10-CM

## 2023-02-22 DIAGNOSIS — F411 Generalized anxiety disorder: Secondary | ICD-10-CM

## 2023-02-23 ENCOUNTER — Encounter: Payer: 59 | Admitting: Physical Therapy

## 2023-02-27 ENCOUNTER — Telehealth: Payer: 59 | Admitting: Physician Assistant

## 2023-02-27 DIAGNOSIS — A084 Viral intestinal infection, unspecified: Secondary | ICD-10-CM

## 2023-02-27 MED ORDER — LOPERAMIDE HCL 2 MG PO TABS: 2.0000 mg | ORAL_TABLET | Freq: Four times a day (QID) | ORAL | 0 refills | Status: DC | PRN

## 2023-02-27 MED ORDER — DICYCLOMINE HCL 10 MG PO CAPS: 10.0000 mg | ORAL_CAPSULE | Freq: Three times a day (TID) | ORAL | 0 refills | Status: DC

## 2023-02-27 MED ORDER — ONDANSETRON 4 MG PO TBDP: 4.0000 mg | ORAL_TABLET | Freq: Three times a day (TID) | ORAL | 0 refills | Status: DC | PRN

## 2023-02-27 NOTE — Patient Instructions (Signed)
Barrington Ellison, thank you for joining Mar Daring, PA-C for today's virtual visit.  While this provider is not your primary care provider (PCP), if your PCP is located in our provider database this encounter information will be shared with them immediately following your visit.   [facility] account gives you access to today's visit and all your visits, tests, and labs performed at [facility] " click here if you don't have a [facility] account or go to [email or web address]  Consent: (Patient) Russell [name] provided verbal consent for this virtual visit at the beginning of the encounter.  Current Medications:  Current Outpatient Medications:    albuterol (VENTOLIN HFA) 108 (90 Base) MCG/ACT inhaler, Inhale 2 puffs into the lungs every 6 (six) hours as needed for wheezing or shortness of breath., Disp: 8 g, Rfl: 11   dicyclomine (BENTYL) 10 MG capsule, Take 1 capsule (10 mg total) by mouth 4 (four) times daily -  before meals and at bedtime., Disp: 40 capsule, Rfl: 0   EPINEPHrine (EPIPEN) 0.3 mg/0.3 mL DEVI, Inject 0.3 mLs (0.3 mg total) into the muscle once., Disp: 1 Device, Rfl: 0   famotidine-calcium carbonate-magnesium hydroxide (PEPCID COMPLETE) 10-800-165 MG chewable tablet, Chew 1 tablet by mouth daily as needed., Disp: 100 tablet, Rfl: 11   fluticasone (FLONASE) 50 MCG/ACT nasal spray, Place 1-2 sprays into both nostrils daily., Disp: 16 g, Rfl: 0   ibuprofen (ADVIL) 800 MG tablet, Take 1 tablet (800 mg total) by mouth 3 (three) times daily. (Patient not taking: Reported on 01/13/2023), Disp: 21 tablet, Rfl: 0   loperamide (IMODIUM A-D) 2 MG tablet, Take 1 tablet (2 mg total) by mouth 4 (four) times daily as needed for diarrhea or loose stools., Disp: 30 tablet, Rfl: 0   meclizine (ANTIVERT) 25 MG tablet, Take 1 tablet (25 mg total) by mouth 3 (three) times daily as needed for dizziness., Disp: 30 tablet, Rfl: 0   mometasone-formoterol (DULERA)  200-5 MCG/ACT AERO, Inhale 2 puffs into the lungs 2 (two) times daily., Disp: 13 g, Rfl: 3   ondansetron (ZOFRAN) 4 MG tablet, Take 1 tablet (4 mg total) by mouth every 6 (six) hours., Disp: 12 tablet, Rfl: 0   ondansetron (ZOFRAN-ODT) 4 MG disintegrating tablet, Take 1 tablet (4 mg total) by mouth every 8 (eight) hours as needed., Disp: 20 tablet, Rfl: 0   oxiconazole (OXISTAT) 1 % CREA, Apply 1 application  topically 2 (two) times daily. (Patient not taking: Reported on 01/13/2023), Disp: 30 g, Rfl: 0   Medications ordered in this encounter:  Meds ordered this encounter  Medications   ondansetron (ZOFRAN-ODT) 4 MG disintegrating tablet    Sig: Take 1 tablet (4 mg total) by mouth every 8 (eight) hours as needed.    Dispense:  20 tablet    Refill:  0    Order Specific Question:   Supervising Provider    Answer:   Chase Picket [id number]   loperamide (IMODIUM A-D) 2 MG tablet    Sig: Take 1 tablet (2 mg total) by mouth 4 (four) times daily as needed for diarrhea or loose stools.    Dispense:  30 tablet    Refill:  0    Order Specific Question:   Supervising Provider    Answer:   Chase Picket [id number]   dicyclomine (BENTYL) 10 MG capsule    Sig: Take 1 capsule (10 mg total) by mouth 4 (four) times daily -  before meals  and at bedtime.    Dispense:  40 capsule    Refill:  0    Order Specific Question:   Supervising Provider    Answer:   Chase Picket [id number]     *If you need refills on other medications prior to your next appointment, please contact your pharmacy*  Follow-Up: Call back or seek an in-person evaluation if the symptoms worsen or if the condition fails to improve as anticipated.  [facility] [phone]  Other Instructions  Norovirus Infection Norovirus infection causes inflammation in the stomach and intestines (gastroenteritis) and food poisoning. It is caused by exposure to a virus from a group of similar viruses called  noroviruses. Norovirus spreads very easily from person to person (is very contagious). It often occurs in places where people are in close contact, such as schools, nursing homes, restaurants, and cruise ships. You can get it from food, water, surfaces, or other people who have the virus. Norovirus is also found in the stool (feces) or vomit of infected people. You can spread the infection as soon as you feel sick, and you may continue to be contagious after you recover. What are the causes? This condition is caused by contact with norovirus. You can catch norovirus if you: Eat or drink something that is contaminated with norovirus. Touch surfaces or objects that are contaminated with norovirus and then put your hand in or by your mouth or nose. Have direct contact with an infected person who may or may not still have symptoms. Share food, drink, or utensils with someone who is contagious with norovirus. What are the signs or symptoms? Symptoms usually begin within 12 hours to 2 days after you become infected. Most norovirus symptoms affect the digestive system.Symptoms may include: Nausea, vomiting, and diarrhea. Stomach cramps. Fever. Chills. Headache. Muscle aches and tiredness. How is this diagnosed? This condition may be diagnosed based on: Your symptoms. A physical exam. A stool test. How is this treated? There is no specific treatment for norovirus. Most people get better without treatment in about 2 days. Young children, the elderly, and people who are already sick may take up to 6 days to recover. Follow these instructions at home:  Eating and drinking  Drink plenty of water to replace fluids that are lost through diarrhea and vomiting. This prevents dehydration. Drink enough fluid to keep your urine pale yellow. Drink clear fluids in small amounts as you are able. Clear fluids include water, ice chips, fruit juice with water added (diluted fruit juice), and low-calorie sports  drinks. Avoid fluids that contain a lot of sugar or caffeine, such as energy drinks, sports drinks, and soda. Avoid alcohol. If instructed by your health care provider, drink an oral rehydration solution (ORS). This is a drink that is sold at pharmacies and retail stores. An ORS contains minerals (electrolytes) that you can lose through diarrhea and vomiting. Eat bland, easy-to-digest foods in small amounts as you are able. These foods include rice, lean meats, toast, and crackers. Avoid spicy or fatty foods. General instructions Rest at home while you recover. Do not prepare food for others while you are infected. Wait at least 3 days after you recover from the illness to do this. Take over-the-counter and prescription medicines only as told by your health care provider. Wash your hands frequently with soap and water for at least 20 seconds. Alcohol-based hand sanitizer can be used in addition to soap and water, but sanitizer should not be the only  cleansing method because it is not effective at removing norovirus from your hands or surfaces. Make sure that each person in your household washes his or her hands well and often. Keep all follow-up visits. This is important. How is this prevented? To help prevent the spread of norovirus: Stay at home if you are feeling sick. This will reduce the risk of spreading the virus to others. Wash your hands often with soap and water for at least 20 seconds, especially after using the toilet, helping a child use the toilet, or changing a child's diaper. Wash fruits and vegetables thoroughly before peeling, preparing, or serving them. Throw out any food that a sick person may have touched. Disinfect contaminated surfaces immediately after someone in the household has been sick. Disinfect frequently used surfaces, such as counters, doorknobs, and faucets. Use a bleach-based household cleaner. Immediately remove and wash soiled clothes or sheets. Contact a  health care provider if: You have vomiting, diarrhea, or stomach pain that gets worse. You have symptoms that do not go away after 3-6 days. You have a fever. You cannot drink without vomiting. You feel light-headed or dizzy. Your symptoms get worse. Get help right away if: You develop symptoms of dehydration that do not improve with fluid replacement, such as: Excessive sleepiness. Lack of tears. Very little urine production. Dry mouth. Muscle cramps. Weak pulse. Confusion. Summary Norovirus infection is common and often occurs in places where people are in close contact, such as schools, nursing homes, restaurants, and cruise ships. To help prevent the spread of this infection, wash hands with soap and water for at least 20 seconds before handling food or after having contact with stool or body fluids. There is no specific treatment for norovirus, but most people get better without treatment in about 2 days. People who are healthy when infected often recover sooner than those who are elderly, young, or already sick. Replace lost fluids by drinking plenty of water, or by drinking oral rehydration solution (ORS), which contains important minerals called electrolytes. This prevents dehydration. This information is not intended to replace advice given to you by your health care provider. Make sure you discuss any questions you have with your health care provider. Document Revised: 07/14/2021 Document Reviewed: 07/14/2021 Elsevier Patient Education  [place].    If you have been instructed to have an in-person evaluation today at a local Urgent Care facility, please use the link below. It will take you to a list of all of our available [facility], including address, phone number and hours of operation. Please do not delay care.  [facility]  If you or a family member do not have a primary care provider, use the link below to schedule a visit and  establish care. When you choose a [facility] primary care physician or advanced practice provider, you gain a long-term partner in health. Find a Primary Care Provider  Learn more about 's in-office and virtual care options: [facility]

## 2023-02-27 NOTE — Progress Notes (Signed)
Virtual Visit Consent   Russell [name], you are scheduled for a virtual visit with a [facility] provider today. Just as with appointments in the office, your consent must be obtained to participate. Your consent will be active for this visit and any virtual visit you may have with one of our providers in the next 365 days. If you have a MyChart account, a copy of this consent can be sent to you electronically.  As this is a virtual visit, video technology does not allow for your provider to perform a traditional examination. This may limit your provider's ability to fully assess your condition. If your provider identifies any concerns that need to be evaluated in person or the need to arrange testing (such as labs, EKG, etc.), we will make arrangements to do so. Although advances in technology are sophisticated, we cannot ensure that it will always work on either your end or our end. If the connection with a video visit is poor, the visit may have to be switched to a telephone visit. With either a video or telephone visit, we are not always able to ensure that we have a secure connection.  By engaging in this virtual visit, you consent to the provision of healthcare and authorize for your insurance to be billed (if applicable) for the services provided during this visit. Depending on your insurance coverage, you may receive a charge related to this service.  I need to obtain your verbal consent now. Are you willing to proceed with your visit today? Russell [name] has provided verbal consent on 02/27/2023 for a virtual visit (video or telephone). Mar Daring, PA-C  Date: 02/27/2023 2:21 PM  Virtual Visit via Video Note   I, Mar Daring, connected with  Russell [name]  ([id number], May 03, 1991) on 02/27/23 at  2:15 PM EDT by a video-enabled telemedicine application and verified that I am speaking with the correct person using two identifiers.  Location: Patient: Virtual Visit Location  Patient: Home Provider: Virtual Visit Location Provider: Home Office   I discussed the limitations of evaluation and management by telemedicine and the availability of in person appointments. The patient expressed understanding and agreed to proceed.    History of Present Illness: Russell [name] is a 32 y.o. who identifies as a male who was assigned male at birth, and is being seen today for GI issues.  HPI: GI Problem The primary symptoms include abdominal pain, nausea, vomiting and diarrhea. Primary symptoms do not include fever, melena, hematemesis, hematochezia, dysuria or myalgias. The illness began 3 to 5 days ago (Started Thursday, 02/23/23, improved some Friday and Saturday and then worsened on Sunday, 02/26/23). The onset was sudden. The problem has not changed since onset. The illness is also significant for bloating.   Has tried Tums   Problems:  Patient Active Problem List   Diagnosis Date Noted   Loud snoring 02/16/2023   SOB (shortness of breath) 02/16/2023   Excessive daytime sleepiness 02/16/2023   Asthma 01/13/2023   Left elbow pain 01/13/2023   Shoulder impingement, left 01/13/2023   Other fatigue 01/13/2023   Nausea and vomiting 01/13/2023   Weakness 01/13/2023   Sleep apnea 01/13/2023   Adjustment disorder with anxious mood 10/20/2017   Bipolar 1 disorder, depressed, moderate ([place]) 09/22/2017   Family dynamics problem 09/18/2017   Self-mutilation 09/18/2017   Work environment adverse effect 09/18/2017   Bereavement reaction 09/18/2017   Closed nondisplaced fracture of shaft of fourth metacarpal bone of right  hand 04/04/2017   Right hand pain 04/04/2017   GAD (generalized anxiety disorder) 12/10/2015   Tinnitus 04/09/2015   Allergy 03/02/2015   BMI 36.0-36.9,adult 11/02/2014    Allergies:  Allergies  Allergen Reactions   Penicillins     Unknown   Medications:  Current Outpatient Medications:    albuterol (VENTOLIN HFA) 108 (90 Base) MCG/ACT inhaler,  Inhale 2 puffs into the lungs every 6 (six) hours as needed for wheezing or shortness of breath., Disp: 8 g, Rfl: 11   dicyclomine (BENTYL) 10 MG capsule, Take 1 capsule (10 mg total) by mouth 4 (four) times daily -  before meals and at bedtime., Disp: 40 capsule, Rfl: 0   EPINEPHrine (EPIPEN) 0.3 mg/0.3 mL DEVI, Inject 0.3 mLs (0.3 mg total) into the muscle once., Disp: 1 Device, Rfl: 0   famotidine-calcium carbonate-magnesium hydroxide (PEPCID COMPLETE) 10-800-165 MG chewable tablet, Chew 1 tablet by mouth daily as needed., Disp: 100 tablet, Rfl: 11   fluticasone (FLONASE) 50 MCG/ACT nasal spray, Place 1-2 sprays into both nostrils daily., Disp: 16 g, Rfl: 0   ibuprofen (ADVIL) 800 MG tablet, Take 1 tablet (800 mg total) by mouth 3 (three) times daily. (Patient not taking: Reported on 01/13/2023), Disp: 21 tablet, Rfl: 0   loperamide (IMODIUM A-D) 2 MG tablet, Take 1 tablet (2 mg total) by mouth 4 (four) times daily as needed for diarrhea or loose stools., Disp: 30 tablet, Rfl: 0   meclizine (ANTIVERT) 25 MG tablet, Take 1 tablet (25 mg total) by mouth 3 (three) times daily as needed for dizziness., Disp: 30 tablet, Rfl: 0   mometasone-formoterol (DULERA) 200-5 MCG/ACT AERO, Inhale 2 puffs into the lungs 2 (two) times daily., Disp: 13 g, Rfl: 3   ondansetron (ZOFRAN) 4 MG tablet, Take 1 tablet (4 mg total) by mouth every 6 (six) hours., Disp: 12 tablet, Rfl: 0   ondansetron (ZOFRAN-ODT) 4 MG disintegrating tablet, Take 1 tablet (4 mg total) by mouth every 8 (eight) hours as needed., Disp: 20 tablet, Rfl: 0   oxiconazole (OXISTAT) 1 % CREA, Apply 1 application  topically 2 (two) times daily. (Patient not taking: Reported on 01/13/2023), Disp: 30 g, Rfl: 0  Observations/Objective: Patient is well-developed, well-nourished in no acute distress.  Resting comfortably at home.  Head is normocephalic, atraumatic.  No labored breathing.  Speech is clear and coherent with logical content.  Patient is alert  and oriented at baseline.    Assessment and Plan: 1. Viral gastroenteritis - ondansetron (ZOFRAN-ODT) 4 MG disintegrating tablet; Take 1 tablet (4 mg total) by mouth every 8 (eight) hours as needed.  Dispense: 20 tablet; Refill: 0 - loperamide (IMODIUM A-D) 2 MG tablet; Take 1 tablet (2 mg total) by mouth 4 (four) times daily as needed for diarrhea or loose stools.  Dispense: 30 tablet; Refill: 0 - dicyclomine (BENTYL) 10 MG capsule; Take 1 capsule (10 mg total) by mouth 4 (four) times daily -  before meals and at bedtime.  Dispense: 40 capsule; Refill: 0  - Suspect viral gastroenteritis - Zofran for nausea - Push fluids, electrolyte beverages - Liquid diet, then increase to soft/bland (BRAT) diet over next day, then increase diet as tolerated - Seek in person evaluation if not improving or symptoms worsen   Follow Up Instructions: I discussed the assessment and treatment plan with the patient. The patient was provided an opportunity to ask questions and all were answered. The patient agreed with the plan and demonstrated an understanding of the instructions.  A copy of instructions were sent to the patient via MyChart unless otherwise noted below.    The patient was advised to call back or seek an in-person evaluation if the symptoms worsen or if the condition fails to improve as anticipated.  Time:  I spent 10 minutes with the patient via telehealth technology discussing the above problems/concerns.    Mar Daring, PA-C

## 2023-03-01 NOTE — Therapy (Deleted)
OUTPATIENT PHYSICAL THERAPY TREATMENT NOTE   Patient Name: Russell [name] MRN: [id number] DOB:1991/11/11, 32 y.o., male Today's Date: 03/01/2023   PCP: Berniece Pap, MD    REFERRING PROVIDER: Gregor Hams, MD  END OF SESSION:    Past Medical History:  Diagnosis Date   Allergy    Anxiety    Asthma    Asthma 01/13/2023   Depression    Left elbow pain 01/13/2023   Elbow > shoulder Radiates to numbing sensation in pinky and ring finger. Not lifting heavy stuff   Obesity    Past Surgical History:  Procedure Laterality Date   TONSILLECTOMY     TYMPANOSTOMY TUBE PLACEMENT     Patient Active Problem List   Diagnosis Date Noted   Loud snoring 02/16/2023   SOB (shortness of breath) 02/16/2023   Excessive daytime sleepiness 02/16/2023   Asthma 01/13/2023   Left elbow pain 01/13/2023   Shoulder impingement, left 01/13/2023   Other fatigue 01/13/2023   Nausea and vomiting 01/13/2023   Weakness 01/13/2023   Sleep apnea 01/13/2023   Adjustment disorder with anxious mood 10/20/2017   Bipolar 1 disorder, depressed, moderate ([place]) 09/22/2017   Family dynamics problem 09/18/2017   Self-mutilation 09/18/2017   Work environment adverse effect 09/18/2017   Bereavement reaction 09/18/2017   Closed nondisplaced fracture of shaft of fourth metacarpal bone of right hand 04/04/2017   Right hand pain 04/04/2017   GAD (generalized anxiety disorder) 12/10/2015   Tinnitus 04/09/2015   Allergy 03/02/2015   BMI 36.0-36.9,adult 11/02/2014    THERAPY DIAG:  No diagnosis found.    REFERRING DIAG: M25.512,G89.29 (ICD-10-CM) - Chronic left shoulder pain      Rationale for Evaluation and Treatment: Rehabilitation   ONSET DATE: 4 years ago   SUBJECTIVE:                                                                                                                                                                                       SUBJECTIVE STATEMENT: 03/01/2023  Eval: States  that carrying 10 pounds increases pain in his hand and elbow and in his arm and sometimes his hand will go numb in the last 2 fingers.    States he has pain in his arm and it wakes him up 1x. Reports pain is throbbing, sometimes he sleeps on it but has changed that and it still hurts. Report that he has tried OTC pain meds and gentle stretches. 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      States that his arm feels weak when he  is in a lot of pain. States he has a 11 month old at home that likes to play. States he games on the [facility] during the weekend and feels he has good posture.      PERTINENT HISTORY: N/A    PAIN:  Are you having pain? Yes: NPRS scale: 3/10 Pain location: left arm -entire Pain description: achy, throbbing Aggravating factors: using, sleeping on it Relieving factors: nothing   PRECAUTIONS: None   WEIGHT BEARING RESTRICTIONS: No   FALLS:  Has patient fallen in last 6 months? No       OCCUPATION: Maintenance tech   PLOF: Independent   PATIENT GOALS:to have less pain      OBJECTIVE:    DIAGNOSTIC FINDINGS:  Normal left shoulder radiographs.     COGNITION: Overall cognitive status: Within functional limits for tasks assessed                                      POSTURE: Rounded shoulders, forward head, sacral sitting.              cervical A/PROM:    02/14/2023      Flexion 50% limited*      Extension  20% limited     R ROT  25% limited     L ROT  50% limited     R SB 25% limited *     L SB 25% limited*                          * Pain  In neck and shoulder           (Blank rows = not tested)                UE Measurements       Upper Extremity Right 02/14/2023 Left 02/14/2023    A/PROM MMT A/PROM MMT  Shoulder Flexion WFL 4 WFL 3+*  Shoulder Extension          Shoulder Abduction WFL 4+ WFL  3+*  Shoulder Adduction          Shoulder Internal Rotation Reaches  to T12 SP 4+ Reaches to T6 SP* 3+*  Shoulder External Rotation Reaches to C7 SP 4 Reaches to C7 SP* 4  Elbow Flexion          Elbow Extension          Wrist Flexion          Wrist Extension          Wrist Supination          Wrist Pronation          Wrist Ulnar Deviation          Wrist Radial Deviation          Grip Strength NA   NA                          (Blank rows = not tested)                       * pain   Right handed       PALPATION:  Tenderness to palpation along B UT, left anterior shoulder, biceps and wrist extensors.             TODAY'S TREATMENT:  DATE:  03/01/2023    Therapeutic Exercise:    Aerobic: Supine: Prone:    Seated:    Standing: shoulder flexion up wall with pillow case x15  Neuromuscular Re-education:sitting on sit bones not on tail bone -5 minutes Manual Therapy: Therapeutic Activity: Self Care: Trigger Point Dry Needling:  Modalities:    PATIENT EDUCATION:  Education details: on current presentation, on HEP, on posture and anatomy and POC, on sleep postures with patient and semileft side-lying with pillow behind Person educated: Patient Education method: Explanation, Demonstration, and Handouts Education comprehension: verbalized understanding     HOME EXERCISE PROGRAM: [id number]   ASSESSMENT:   CLINICAL IMPRESSION: 03/01/2023  Eval: Patient presents to physical therapy with chronic shoulder pain.  Patient presents weakness and range of motion limitations as well as postural deficits likely contributing to current condition.  Session focused on education as well as establishing home exercise program.  Tolerated all interventions well with no pain during session.  Patient would greatly benefit from skilled physical therapy to improve overall function and use of left upper extremity.   OBJECTIVE  IMPAIRMENTS: decreased activity tolerance, decreased ROM, decreased strength, impaired UE functional use, postural dysfunction, and pain.    ACTIVITY LIMITATIONS: carrying, lifting, reach over head, and hygiene/grooming   PARTICIPATION LIMITATIONS: occupation   PERSONAL FACTORS: Fitness and Time since onset of injury/illness/exacerbation are also affecting patient's functional outcome.    REHAB POTENTIAL: Good   CLINICAL DECISION MAKING: Stable/uncomplicated   EVALUATION COMPLEXITY: Low     GOALS: Goals reviewed with patient? yes   SHORT TERM GOALS: Target date: 03/14/2023  Patient will be independent in self management strategies to improve quality of life and functional outcomes. Baseline: New Program Goal status: INITIAL   2.  Patient will report at least 50% improvement in overall symptoms and/or function to demonstrate improved functional mobility Baseline: 0% better Goal status: INITIAL   3.  Patient will demonstrate pain-free left upper extremity range of motion in all directions Baseline: Painful Goal status: INITIAL       LONG TERM GOALS: Target date: 04/11/2023    Patient will report at least 75% improvement in overall symptoms and/or function to demonstrate improved functional mobility Baseline: 0% better Goal status: INITIAL   2.  Patient will demonstrate at least 4 out of 5 MMT strength in bilateral upper extremities Baseline: see above Goal status: INITIAL   3.  Patient will report not waking up at night secondary to pain in left arm Baseline: Currently waking up 1 time at night due to pain in left arm Goal status: INITIAL         PLAN:   PT FREQUENCY: 1x/week   PT DURATION: 8 weeks   PLANNED INTERVENTIONS: Therapeutic exercises, Therapeutic activity, Neuromuscular re-education, Balance training, Gait training, Patient/Family education, Self Care, Joint mobilization, Joint manipulation, Vestibular training, Orthotic/Fit training, DME instructions,  Aquatic Therapy, Dry Needling, Electrical stimulation, Spinal manipulation, Spinal mobilization, Cryotherapy, Moist heat, Traction, Ultrasound, Ionotophoresis '4mg'$ /ml Dexamethasone, Manual therapy, and Re-evaluation   PLAN FOR NEXT SESSION: pec stretches, shoulder stability, posture     10:15 AM, 03/01/23 Jerene Pitch, DPT Physical Therapy with [facility]

## 2023-03-02 ENCOUNTER — Telehealth: Payer: 59 | Admitting: Family Medicine

## 2023-03-02 ENCOUNTER — Ambulatory Visit (HOSPITAL_COMMUNITY)
Admission: RE | Admit: 2023-03-02 | Discharge: 2023-03-02 | Disposition: A | Discharge status: Home / Self Care | Payer: 59 | Source: Ambulatory Visit | Attending: Family Medicine | Admitting: Family Medicine

## 2023-03-02 ENCOUNTER — Encounter: Payer: 59 | Admitting: Physical Therapy

## 2023-03-02 ENCOUNTER — Other Ambulatory Visit: Payer: Self-pay

## 2023-03-02 ENCOUNTER — Encounter (HOSPITAL_COMMUNITY): Payer: Self-pay

## 2023-03-02 VITALS — BP 133/88 | HR 99 | Temp 98.3°F | Resp 18 | Ht 77.0 in | Wt 307.3 lb

## 2023-03-02 DIAGNOSIS — K529 Noninfective gastroenteritis and colitis, unspecified: Secondary | ICD-10-CM

## 2023-03-02 DIAGNOSIS — A084 Viral intestinal infection, unspecified: Secondary | ICD-10-CM

## 2023-03-02 DIAGNOSIS — R197 Diarrhea, unspecified: Secondary | ICD-10-CM

## 2023-03-02 NOTE — Progress Notes (Signed)
[facility] TEST REPORT ( by Watch PAT)   STUDY DATE:  02-28-2022 DOB:  08/24/1991 MRN:  [id number]    ORDERING CLINICIAN:  REFERRING CLINICIAN:    CLINICAL INFORMATION/HISTORY: CD 02-16-2023: New patient visit , referral from Dr. Randol Kern.   " non- restorative sleep, sometimes sleeping 12 hours and waking up more tired. Averaging 8 hours of nocturnal sleep, waking from SOB, uses inhaler. Sleep study was suggested in his youth, but never went through. Asthma dx since age 15. "     Epworth sleepiness score: 14 /24. FSS at 49/ 63 points    BMI:  36.5 kg/m   Neck Circumference: 20"   FINDINGS:   Sleep Summary:   Total Recording Time (hours, min): Amounted to 7 hours and 29 minutes.      Total Sleep Time (hours, min):   6 hours 41 minutes.              Percent REM (%): 31%    Sleep latency was 27 minutes, REM sleep latency was 48 minutes.  Sleep was not fragmented.                                    Respiratory Indices:   Calculated pAHI (per hour): 9.0/h                           REM pAHI:   18.4/h                                              NREM pAHI: 4.8/h                            Positional AHI: The patient slept exclusively in supine position associated with an AHI of 9.5/h.     Snoring reached a mean volume of 40 dB which is just at threshold and was present for about 8% of total sleep time.                                             Oxygen Saturation Statistics:       O2 Saturation Range (%): Between a nadir at 89% and a maximum saturation at 98% with a mean oxygen saturation at 94%.                                      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     RECOMMENDATION: due to the strong REM sleep component I would favor  CPAP therapy approach, and I will order an auto-titration capable CPAP device with a setting between 5 and 16 cm water, 3 cm EPR and heated humidification for this patient.  I will ask the durable medical equipment company to specifically look at nasal bridge as well as fullface masks with this patient.  His nasal airflow was not fully patent.  RV between day 30 and today 89 of CPAP therapy.  Please assure the patient that he can change any interface within the first 13 days of CPAP use but has to communicate any trouble with the device or interface fitting with the DME not with the sleep clinic.  For further recommend more weight loss, which is likely to reduce REM sleep dependent apnea in supine position even further.    INTERPRETING PHYSICIAN:   Larey Seat, MD   Medical Director of [facility] at [facility].

## 2023-03-02 NOTE — ED Notes (Signed)
Dc instructions given and stool collection kit given to pt to collect stool sample and bring it back for testing. Pt verbalized understanding.

## 2023-03-02 NOTE — Progress Notes (Signed)
Hyampom   Ongoing symptoms despite use of zofran, imodium, and bentyl. Advised that in person evaluation is needed given these first lines did not improve much.  Patient acknowledged agreement and understanding of the plan.

## 2023-03-02 NOTE — ED Triage Notes (Signed)
Pt arrive c/o abd pain, nausea, vomiting and diarrhea since Saturday not getting better.

## 2023-03-02 NOTE — Discharge Instructions (Signed)
Pick up the Zofran previously prescribed. Please do your best to ensure adequate fluid intake in order to avoid dehydration. If you find that you are unable to tolerate drinking fluids regularly please proceed to the Emergency Department for evaluation.

## 2023-03-03 ENCOUNTER — Ambulatory Visit: Payer: 59 | Admitting: Family Medicine

## 2023-03-06 NOTE — Addendum Note (Signed)
Addended by: Larey Seat on: 03/06/2023 12:43 PM   Modules accepted: Orders

## 2023-03-06 NOTE — Procedures (Signed)
[facility] TEST REPORT ( by Watch PAT)   STUDY DATE:  02-28-2022 DOB:  08/24/1991 MRN:  [id number]    ORDERING CLINICIAN:  REFERRING CLINICIAN:    CLINICAL INFORMATION/HISTORY: CD 02-16-2023: New patient visit , referral from Dr. Randol Kern.   " non- restorative sleep, sometimes sleeping 12 hours and waking up more tired. Averaging 8 hours of nocturnal sleep, waking from SOB, uses inhaler. Sleep study was suggested in his youth, but never went through. Asthma dx since age 15. "     Epworth sleepiness score: 14 /24. FSS at 49/ 63 points    BMI:  36.5 kg/m   Neck Circumference: 20"   FINDINGS:   Sleep Summary:   Total Recording Time (hours, min): Amounted to 7 hours and 29 minutes.      Total Sleep Time (hours, min):   6 hours 41 minutes.              Percent REM (%): 31%    Sleep latency was 27 minutes, REM sleep latency was 48 minutes.  Sleep was not fragmented.                                    Respiratory Indices:   Calculated pAHI (per hour): 9.0/h                           REM pAHI:   18.4/h                                              NREM pAHI: 4.8/h                            Positional AHI: The patient slept exclusively in supine position associated with an AHI of 9.5/h.     Snoring reached a mean volume of 40 dB which is just at threshold and was present for about 8% of total sleep time.                                             Oxygen Saturation Statistics:       O2 Saturation Range (%): Between a nadir at 89% and a maximum saturation at 98% with a mean oxygen saturation at 94%.                                      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     RECOMMENDATION: due to the strong REM sleep component I would favor  CPAP therapy approach, and I will order an auto-titration capable CPAP device with a setting between 5 and 16 cm water, 3 cm EPR and heated humidification for this patient.  I will ask the durable medical equipment company to specifically look at nasal bridge as well as fullface masks with this patient.  His nasal airflow was not fully patent.  RV between day 30 and today 89 of CPAP therapy.  Please assure the patient that he can change any interface within the first 13 days of CPAP use but has to communicate any trouble with the device or interface fitting with the DME not with the sleep clinic.  For further recommend more weight loss, which is likely to reduce REM sleep dependent apnea in supine position even further.    INTERPRETING PHYSICIAN:   Larey Seat, MD   Medical Director of [facility] at [facility]

## 2023-03-07 NOTE — ED Provider Notes (Signed)
[place]   MS:[id number] 03/02/23 Arrival Time: [place]:  1. Gastroenteritis   2. Diarrhea, unspecified type    Orders Placed This Encounter  Procedures   Gastrointestinal Panel by PCR , Stool    Standing Status:   Future    Standing Expiration Date:   04/02/2023    Will hold-off on treatment until stool panel back. No signs of dehydration req IVF. Benign abdomen. If diarrhea is extremely frequent, he may sparingly use OTC Imodium.  Discussed typical duration of symptoms for suspected viral GI illness. Will do his best to ensure adequate fluid intake in order to avoid dehydration. Will proceed to the Emergency Department for evaluation if unable to tolerate PO fluids regularly.  Otherwise he will f/u with his PCP or here if not showing improvement over the next 48-72 hours.  Reviewed expectations re: course of current medical issues. Questions answered. Outlined signs and symptoms indicating need for more acute intervention. Patient verbalized understanding. After Visit Summary given.   SUBJECTIVE: History from: patient.  Russell [name] is a 32 y.o. male who presents with complaint of non-bilious, non-bloody intermittent n/v with non-bloody diarrhea. Onset  4-5 d ago; better yesterday but back again today . Abdominal discomfort: mild and cramping. Symptoms are stable since beginning. Aggravating factors: eating. Alleviating factors: none identified. Associated symptoms: fatigue. He denies fever. Appetite: decreased. PO intake: decreased. Ambulatory without assistance. Urinary symptoms: none. Sick contacts: none. Recent travel or camping: none. OTC treatment: none.   Past Surgical History:  Procedure Laterality Date   TONSILLECTOMY     TYMPANOSTOMY TUBE PLACEMENT     OBJECTIVE:  Vitals:   03/02/23 1855 03/02/23 1856  BP: 133/88   Pulse: 99   Resp: 18   Temp: 98.3 F (36.8 C)   TempSrc: Oral   SpO2: 98%   Weight:  (!) 139.4 kg  Height:   6\' 5"  (1.956 m)    General appearance: alert; no distress Oropharynx: moist Lungs: clear to auscultation bilaterally; unlabored Heart: regular rate and rhythm Abdomen: soft; non-distended; no significant abdominal tenderness; no masses or organomegaly; no guarding or rebound tenderness Back: no CVA tenderness Extremities: no edema; symmetrical with no gross deformities Skin: warm; dry Neurologic: normal gait Psychological: alert and cooperative; normal mood and affect    Allergies  Allergen Reactions   Penicillins     Unknown                                               Past Medical History:  Diagnosis Date   Allergy    Anxiety    Asthma    Asthma 01/13/2023   Depression    Left elbow pain 01/13/2023   Elbow > shoulder Radiates to numbing sensation in pinky and ring finger. Not lifting heavy stuff   Obesity    Social History   Socioeconomic History   Marital status: Significant Other    Spouse name: Not on file   Number of children: Not on file   Years of education: Not on file   Highest education level: Not on file  Occupational History   Not on file  Tobacco Use   Smoking status: Never   Smokeless tobacco: Current    Types: Chew  Vaping Use   Vaping Use: Every day  Substance and Sexual Activity   Alcohol use: Yes  Comment: occasionally   Drug use: No   Sexual activity: Yes  Other Topics Concern   Not on file  Social History Narrative   Not on file   Social Determinants of Health   Financial Resource Strain: Not on file  Food Insecurity: Not on file  Transportation Needs: Not on file  Physical Activity: Not on file  Stress: Not on file  Social Connections: Not on file  Intimate Partner Violence: Not on file   Family History  Problem Relation Age of Onset   Hyperlipidemia Mother    Early death Mother    Hypertension Mother    Mental illness Mother    Alcohol abuse Father    Asthma Father    COPD Father    Drug abuse Father    Early  death Father    Hypertension Father    Hyperlipidemia Father    Stroke Father    Heart attack Father    Mental retardation Maternal Uncle    Heart disease Maternal Reymundo Poll, MD 03/07/23 [phone]

## 2023-03-09 ENCOUNTER — Encounter: Payer: 59 | Admitting: Physical Therapy

## 2023-03-09 NOTE — Therapy (Deleted)
OUTPATIENT PHYSICAL THERAPY TREATMENT NOTE   Patient Name: Russell [name] MRN: [id number] DOB:09/08/1991, 32 y.o., male Today's Date: 03/01/2023   PCP: Morrison, Ryan, MD    REFERRING PROVIDER: Corey, Evan S, MD  END OF SESSION:    Past Medical History:  Diagnosis Date   Allergy    Anxiety    Asthma    Asthma 01/13/2023   Depression    Left elbow pain 01/13/2023   Elbow > shoulder Radiates to numbing sensation in pinky and ring finger. Not lifting heavy stuff   Obesity    Past Surgical History:  Procedure Laterality Date   TONSILLECTOMY     TYMPANOSTOMY TUBE PLACEMENT     Patient Active Problem List   Diagnosis Date Noted   Loud snoring 02/16/2023   SOB (shortness of breath) 02/16/2023   Excessive daytime sleepiness 02/16/2023   Asthma 01/13/2023   Left elbow pain 01/13/2023   Shoulder impingement, left 01/13/2023   Other fatigue 01/13/2023   Nausea and vomiting 01/13/2023   Weakness 01/13/2023   Sleep apnea 01/13/2023   Adjustment disorder with anxious mood 10/20/2017   Bipolar 1 disorder, depressed, moderate (HCC) 09/22/2017   Family dynamics problem 09/18/2017   Self-mutilation 09/18/2017   Work environment adverse effect 09/18/2017   Bereavement reaction 09/18/2017   Closed nondisplaced fracture of shaft of fourth metacarpal bone of right hand 04/04/2017   Right hand pain 04/04/2017   GAD (generalized anxiety disorder) 12/10/2015   Tinnitus 04/09/2015   Allergy 03/02/2015   BMI 36.0-36.9,adult 11/02/2014    THERAPY DIAG:  No diagnosis found.    REFERRING DIAG: M25.512,G89.29 (ICD-10-CM) - Chronic left shoulder pain      Rationale for Evaluation and Treatment: Rehabilitation   ONSET DATE: 4 years ago   SUBJECTIVE:                                                                                                                                                                                       SUBJECTIVE STATEMENT: 03/01/2023  Eval: States  that carrying 10 pounds increases pain in his hand and elbow and in his arm and sometimes his hand will go numb in the last 2 fingers.    States he has pain in his arm and it wakes him up 1x. Reports pain is throbbing, sometimes he sleeps on it but has changed that and it still hurts. Report that he has tried OTC pain meds and gentle stretches. 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      States that his arm feels weak when he   is in a lot of pain. States he has a 10 month old at home that likes to play. States he games on the PC during the weekend and feels he has good posture.      PERTINENT HISTORY: N/A    PAIN:  Are you having pain? Yes: NPRS scale: 3/10 Pain location: left arm -entire Pain description: achy, throbbing Aggravating factors: using, sleeping on it Relieving factors: nothing   PRECAUTIONS: None   WEIGHT BEARING RESTRICTIONS: No   FALLS:  Has patient fallen in last 6 months? No       OCCUPATION: Maintenance tech   PLOF: Independent   PATIENT GOALS:to have less pain      OBJECTIVE:    DIAGNOSTIC FINDINGS:  Normal left shoulder radiographs.     COGNITION: Overall cognitive status: Within functional limits for tasks assessed                                      POSTURE: Rounded shoulders, forward head, sacral sitting.              cervical A/PROM:    02/14/2023      Flexion 50% limited*      Extension  20% limited     R ROT  25% limited     L ROT  50% limited     R SB 25% limited *     L SB 25% limited*                          * Pain  In neck and shoulder           (Blank rows = not tested)                UE Measurements       Upper Extremity Right 02/14/2023 Left 02/14/2023    A/PROM MMT A/PROM MMT  Shoulder Flexion WFL 4 WFL 3+*  Shoulder Extension          Shoulder Abduction WFL 4+ WFL  3+*  Shoulder Adduction          Shoulder Internal Rotation Reaches  to T12 SP 4+ Reaches to T6 SP* 3+*  Shoulder External Rotation Reaches to C7 SP 4 Reaches to C7 SP* 4  Elbow Flexion          Elbow Extension          Wrist Flexion          Wrist Extension          Wrist Supination          Wrist Pronation          Wrist Ulnar Deviation          Wrist Radial Deviation          Grip Strength NA   NA                          (Blank rows = not tested)                       * pain   Right handed       PALPATION:  Tenderness to palpation along B UT, left anterior shoulder, biceps and wrist extensors.             TODAY'S TREATMENT:                                                                                                                                           DATE:  03/01/2023    Therapeutic Exercise:    Aerobic: Supine: Prone:    Seated:    Standing: shoulder flexion up wall with pillow case x15  Neuromuscular Re-education:sitting on sit bones not on tail bone -5 minutes Manual Therapy: Therapeutic Activity: Self Care: Trigger Point Dry Needling:  Modalities:    PATIENT EDUCATION:  Education details: on current presentation, on HEP, on posture and anatomy and POC, on sleep postures with patient and semileft side-lying with pillow behind Person educated: Patient Education method: Explanation, Demonstration, and Handouts Education comprehension: verbalized understanding     HOME EXERCISE PROGRAM: [id number]   ASSESSMENT:   CLINICAL IMPRESSION: 03/01/2023  Eval: Patient presents to physical therapy with chronic shoulder pain.  Patient presents weakness and range of motion limitations as well as postural deficits likely contributing to current condition.  Session focused on education as well as establishing home exercise program.  Tolerated all interventions well with no pain during session.  Patient would greatly benefit from skilled physical therapy to improve overall function and use of left upper extremity.   OBJECTIVE  IMPAIRMENTS: decreased activity tolerance, decreased ROM, decreased strength, impaired UE functional use, postural dysfunction, and pain.    ACTIVITY LIMITATIONS: carrying, lifting, reach over head, and hygiene/grooming   PARTICIPATION LIMITATIONS: occupation   PERSONAL FACTORS: Fitness and Time since onset of injury/illness/exacerbation are also affecting patient's functional outcome.    REHAB POTENTIAL: Good   CLINICAL DECISION MAKING: Stable/uncomplicated   EVALUATION COMPLEXITY: Low     GOALS: Goals reviewed with patient? yes   SHORT TERM GOALS: Target date: 03/14/2023  Patient will be independent in self management strategies to improve quality of life and functional outcomes. Baseline: New Program Goal status: INITIAL   2.  Patient will report at least 50% improvement in overall symptoms and/or function to demonstrate improved functional mobility Baseline: 0% better Goal status: INITIAL   3.  Patient will demonstrate pain-free left upper extremity range of motion in all directions Baseline: Painful Goal status: INITIAL       LONG TERM GOALS: Target date: 04/11/2023    Patient will report at least 75% improvement in overall symptoms and/or function to demonstrate improved functional mobility Baseline: 0% better Goal status: INITIAL   2.  Patient will demonstrate at least 4 out of 5 MMT strength in bilateral upper extremities Baseline: see above Goal status: INITIAL   3.  Patient will report not waking up at night secondary to pain in left arm Baseline: Currently waking up 1 time at night due to pain in left arm Goal status: INITIAL         PLAN:   PT FREQUENCY: 1x/week   PT DURATION: 8 weeks   PLANNED INTERVENTIONS: Therapeutic exercises, Therapeutic activity, Neuromuscular re-education, Balance training, Gait training, Patient/Family education, Self Care, Joint mobilization, Joint manipulation, Vestibular training, Orthotic/Fit training, DME instructions,  Aquatic Therapy, Dry Needling, Electrical stimulation, Spinal manipulation, Spinal mobilization, Cryotherapy, Moist heat, Traction, Ultrasound, Ionotophoresis 4mg/ml Dexamethasone, Manual therapy, and Re-evaluation   PLAN FOR NEXT SESSION: pec stretches, shoulder stability, posture     10:15 AM, 03/01/23 Ezekiah Massie, DPT Physical Therapy with [place]      

## 2023-03-09 NOTE — Progress Notes (Deleted)
   Shirlyn Goltz, PhD, LAT, ATC acting as a scribe for Lynne Leader, MD.  Russell [name] is a 32 y.o. male who presents to [place] at [facility] today for follow-up left shoulder and left elbow pain. Pt is R-hand dominate. 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  Patient was last seen by Dr. Georgina Snell on 01/20/2023 and was advised to use a night splint and was referred to PT, completing???  Dx imaging: 2/2/24L shoulder x-ray    Pertinent review of systems: ***  Relevant historical information: ***   Exam:  There were no vitals taken for this visit. General: Well Developed, well nourished, and in no acute distress.   MSK: ***    Lab and Radiology Results No results found for this or any previous visit (from the past 72 hour(s)). No results found.     Assessment and Plan: 32 y.o. male with ***   PDMP not reviewed this encounter. No orders of the defined types were placed in this encounter.  No orders of the defined types were placed in this encounter.    Discussed warning signs or symptoms. Please see discharge instructions. Patient expresses understanding.   ***

## 2023-03-10 ENCOUNTER — Ambulatory Visit: Payer: 59 | Admitting: Family Medicine

## 2023-03-16 ENCOUNTER — Encounter: Payer: 59 | Admitting: Physical Therapy

## 2023-03-16 NOTE — Therapy (Deleted)
OUTPATIENT PHYSICAL THERAPY TREATMENT NOTE   Patient Name: Russell [name] MRN: [id number] DOB:1991/11/09, 32 y.o., male Today's Date: 03/16/2023   PCP: Berniece Pap, MD    REFERRING PROVIDER: Gregor Hams, MD  END OF SESSION:    Past Medical History:  Diagnosis Date   Allergy    Anxiety    Asthma    Asthma 01/13/2023   Depression    Left elbow pain 01/13/2023   Elbow > shoulder Radiates to numbing sensation in pinky and ring finger. Not lifting heavy stuff   Obesity    Past Surgical History:  Procedure Laterality Date   TONSILLECTOMY     TYMPANOSTOMY TUBE PLACEMENT     Patient Active Problem List   Diagnosis Date Noted   Loud snoring 02/16/2023   SOB (shortness of breath) 02/16/2023   Excessive daytime sleepiness 02/16/2023   Asthma 01/13/2023   Left elbow pain 01/13/2023   Shoulder impingement, left 01/13/2023   Other fatigue 01/13/2023   Nausea and vomiting 01/13/2023   Weakness 01/13/2023   Sleep apnea 01/13/2023   Adjustment disorder with anxious mood 10/20/2017   Bipolar 1 disorder, depressed, moderate ([place]) 09/22/2017   Family dynamics problem 09/18/2017   Self-mutilation 09/18/2017   Work environment adverse effect 09/18/2017   Bereavement reaction 09/18/2017   Closed nondisplaced fracture of shaft of fourth metacarpal bone of right hand 04/04/2017   Right hand pain 04/04/2017   GAD (generalized anxiety disorder) 12/10/2015   Tinnitus 04/09/2015   Allergy 03/02/2015   BMI 36.0-36.9,adult 11/02/2014    THERAPY DIAG:  No diagnosis found.    REFERRING DIAG: M25.512,G89.29 (ICD-10-CM) - Chronic left shoulder pain      Rationale for Evaluation and Treatment: Rehabilitation   ONSET DATE: 4 years ago   SUBJECTIVE:                                                                                                                                                                                       SUBJECTIVE STATEMENT: 03/16/2023  Eval: States  that carrying 10 pounds increases pain in his hand and elbow and in his arm and sometimes his hand will go numb in the last 2 fingers.    States he has pain in his arm and it wakes him up 1x. Reports pain is throbbing, sometimes he sleeps on it but has changed that and it still hurts. Report that he has tried OTC pain meds and gentle stretches. 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      States that his arm feels weak when he  is in a lot of pain. States he has a 11 month old at home that likes to play. States he games on the [facility] during the weekend and feels he has good posture.      PERTINENT HISTORY: N/A    PAIN:  Are you having pain? Yes: NPRS scale: 3/10 Pain location: left arm -entire Pain description: achy, throbbing Aggravating factors: using, sleeping on it Relieving factors: nothing   PRECAUTIONS: None   WEIGHT BEARING RESTRICTIONS: No   FALLS:  Has patient fallen in last 6 months? No       OCCUPATION: Maintenance tech   PLOF: Independent   PATIENT GOALS:to have less pain      OBJECTIVE:    DIAGNOSTIC FINDINGS:  Normal left shoulder radiographs.     COGNITION: Overall cognitive status: Within functional limits for tasks assessed                                      POSTURE: Rounded shoulders, forward head, sacral sitting.              cervical A/PROM:    02/14/2023      Flexion 50% limited*      Extension  20% limited     R ROT  25% limited     L ROT  50% limited     R SB 25% limited *     L SB 25% limited*                          * Pain  In neck and shoulder           (Blank rows = not tested)                UE Measurements       Upper Extremity Right 02/14/2023 Left 02/14/2023    A/PROM MMT A/PROM MMT  Shoulder Flexion WFL 4 WFL 3+*  Shoulder Extension          Shoulder Abduction WFL 4+ WFL  3+*  Shoulder Adduction          Shoulder Internal Rotation Reaches  to T12 SP 4+ Reaches to T6 SP* 3+*  Shoulder External Rotation Reaches to C7 SP 4 Reaches to C7 SP* 4  Elbow Flexion          Elbow Extension          Wrist Flexion          Wrist Extension          Wrist Supination          Wrist Pronation          Wrist Ulnar Deviation          Wrist Radial Deviation          Grip Strength NA   NA                          (Blank rows = not tested)                       * pain   Right handed       PALPATION:  Tenderness to palpation along B UT, left anterior shoulder, biceps and wrist extensors.             TODAY'S TREATMENT:  DATE:  03/16/2023    Therapeutic Exercise:    Aerobic: Supine: Prone:    Seated:    Standing: shoulder flexion up wall with pillow case x15  Neuromuscular Re-education:sitting on sit bones not on tail bone -5 minutes Manual Therapy: Therapeutic Activity: Self Care: Trigger Point Dry Needling:  Modalities:    PATIENT EDUCATION:  Education details: on current presentation, on HEP, on posture and anatomy and POC, on sleep postures with patient and semileft side-lying with pillow behind Person educated: Patient Education method: Explanation, Demonstration, and Handouts Education comprehension: verbalized understanding     HOME EXERCISE PROGRAM: [id number]   ASSESSMENT:   CLINICAL IMPRESSION: 03/16/2023  Eval: Patient presents to physical therapy with chronic shoulder pain.  Patient presents weakness and range of motion limitations as well as postural deficits likely contributing to current condition.  Session focused on education as well as establishing home exercise program.  Tolerated all interventions well with no pain during session.  Patient would greatly benefit from skilled physical therapy to improve overall function and use of left upper extremity.   OBJECTIVE  IMPAIRMENTS: decreased activity tolerance, decreased ROM, decreased strength, impaired UE functional use, postural dysfunction, and pain.    ACTIVITY LIMITATIONS: carrying, lifting, reach over head, and hygiene/grooming   PARTICIPATION LIMITATIONS: occupation   PERSONAL FACTORS: Fitness and Time since onset of injury/illness/exacerbation are also affecting patient's functional outcome.    REHAB POTENTIAL: Good   CLINICAL DECISION MAKING: Stable/uncomplicated   EVALUATION COMPLEXITY: Low     GOALS: Goals reviewed with patient? yes   SHORT TERM GOALS: Target date: 03/14/2023  Patient will be independent in self management strategies to improve quality of life and functional outcomes. Baseline: New Program Goal status: INITIAL   2.  Patient will report at least 50% improvement in overall symptoms and/or function to demonstrate improved functional mobility Baseline: 0% better Goal status: INITIAL   3.  Patient will demonstrate pain-free left upper extremity range of motion in all directions Baseline: Painful Goal status: INITIAL       LONG TERM GOALS: Target date: 04/11/2023    Patient will report at least 75% improvement in overall symptoms and/or function to demonstrate improved functional mobility Baseline: 0% better Goal status: INITIAL   2.  Patient will demonstrate at least 4 out of 5 MMT strength in bilateral upper extremities Baseline: see above Goal status: INITIAL   3.  Patient will report not waking up at night secondary to pain in left arm Baseline: Currently waking up 1 time at night due to pain in left arm Goal status: INITIAL         PLAN:   PT FREQUENCY: 1x/week   PT DURATION: 8 weeks   PLANNED INTERVENTIONS: Therapeutic exercises, Therapeutic activity, Neuromuscular re-education, Balance training, Gait training, Patient/Family education, Self Care, Joint mobilization, Joint manipulation, Vestibular training, Orthotic/Fit training, DME instructions,  Aquatic Therapy, Dry Needling, Electrical stimulation, Spinal manipulation, Spinal mobilization, Cryotherapy, Moist heat, Traction, Ultrasound, Ionotophoresis 4mg /ml Dexamethasone, Manual therapy, and Re-evaluation   PLAN FOR NEXT SESSION: pec stretches, shoulder stability, posture     10:05 AM, 03/16/23 Jerene Pitch, DPT Physical Therapy with [facility]

## 2023-03-23 ENCOUNTER — Encounter: Payer: 59 | Admitting: Physical Therapy

## 2023-03-30 ENCOUNTER — Encounter: Payer: Self-pay | Admitting: Physical Therapy

## 2023-03-30 NOTE — Therapy (Signed)
PHYSICAL THERAPY DISCHARGE SUMMARY  Visits from Start of Care: 1  Current functional level related to goals / functional outcomes: Not able to reassess as patient did not return to therapy   Remaining deficits: Not able to reassess as patient did not return to therapy   Education / Equipment: Not able to reassess as patient did not return to therapy   Patient agrees to discharge. Patient goals were not met. Patient is being discharged due to not returning since the last visit.  Patient formally discharged secondary to No Show/cancellation policy. (3 no shows in a row - 3/22, 3/28, 4/04)  3:30 PM, 03/30/23 Tereasa Coop, DPT Physical Therapy with [facility]

## 2023-04-14 ENCOUNTER — Ambulatory Visit: Payer: Self-pay | Admitting: Internal Medicine

## 2023-09-12 ENCOUNTER — Telehealth: Payer: Self-pay | Admitting: Physician Assistant

## 2023-09-12 DIAGNOSIS — J4540 Moderate persistent asthma, uncomplicated: Secondary | ICD-10-CM

## 2023-09-12 DIAGNOSIS — J208 Acute bronchitis due to other specified organisms: Secondary | ICD-10-CM

## 2023-09-12 MED ORDER — PREDNISONE 20 MG PO TABS: 40.0000 mg | ORAL_TABLET | Freq: Every day | ORAL | 0 refills | Status: DC

## 2023-09-12 MED ORDER — ALBUTEROL SULFATE HFA 108 (90 BASE) MCG/ACT IN AERS: 2.0000 | INHALATION_SPRAY | Freq: Four times a day (QID) | RESPIRATORY_TRACT | 1 refills | Status: DC | PRN

## 2023-09-12 MED ORDER — MOMETASONE FURO-FORMOTEROL FUM 200-5 MCG/ACT IN AERO: 2.0000 | INHALATION_SPRAY | Freq: Two times a day (BID) | RESPIRATORY_TRACT | 0 refills | Status: AC

## 2023-09-12 MED ORDER — BENZONATATE 100 MG PO CAPS: 100.0000 mg | ORAL_CAPSULE | Freq: Three times a day (TID) | ORAL | 0 refills | Status: DC | PRN

## 2023-09-12 NOTE — Progress Notes (Signed)
Virtual Visit Consent   Russell [name], you are scheduled for a virtual visit with a [facility] provider today. Just as with appointments in the office, your consent must be obtained to participate. Your consent will be active for this visit and any virtual visit you may have with one of our providers in the next 365 days. If you have a MyChart account, a copy of this consent can be sent to you electronically.  As this is a virtual visit, video technology does not allow for your provider to perform a traditional examination. This may limit your provider's ability to fully assess your condition. If your provider identifies any concerns that need to be evaluated in person or the need to arrange testing (such as labs, EKG, etc.), we will make arrangements to do so. Although advances in technology are sophisticated, we cannot ensure that it will always work on either your end or our end. If the connection with a video visit is poor, the visit may have to be switched to a telephone visit. With either a video or telephone visit, we are not always able to ensure that we have a secure connection.  By engaging in this virtual visit, you consent to the provision of healthcare and authorize for your insurance to be billed (if applicable) for the services provided during this visit. Depending on your insurance coverage, you may receive a charge related to this service.  I need to obtain your verbal consent now. Are you willing to proceed with your visit today? [name] has provided verbal consent on 09/12/2023 for a virtual visit (video or telephone). Russell [name], [place]  Date: 09/12/2023 6:18 PM  Virtual Visit via Video Note   I, Russell [name], connected with  ABYAN PEARO  ([id number], 11-13-1991) on 09/12/23 at  6:15 PM EDT by a video-enabled telemedicine application and verified that I am speaking with the correct person using two identifiers.  Location: Patient: Virtual Visit Location  Patient: Home Provider: Virtual Visit Location Provider: Home Office   I discussed the limitations of evaluation and management by telemedicine and the availability of in person appointments. The patient expressed understanding and agreed to proceed.    History of Present Illness: Russell [name] is a 32 y.o. who identifies as a male who was assigned male at birth, and is being seen today for URI symptoms starting over the past 6 days with post-nasal drip, cough, chest congestion and fatigue. Still with residual cough that is mainly dry but is persistent and occasionally productive of clear phlegm. Denies any substantial tooth pain but some maxillary sinus pain.  Tested for COVID and was negative.  OTC -- Dayquil Severe plus.   HPI: HPI  Problems:  Patient Active Problem List   Diagnosis Date Noted   Loud snoring 02/16/2023   SOB (shortness of breath) 02/16/2023   Excessive daytime sleepiness 02/16/2023   Asthma 01/13/2023   Left elbow pain 01/13/2023   Shoulder impingement, left 01/13/2023   Other fatigue 01/13/2023   Nausea and vomiting 01/13/2023   Weakness 01/13/2023   Sleep apnea 01/13/2023   Adjustment disorder with anxious mood 10/20/2017   Bipolar 1 disorder, depressed, moderate (HCC) 09/22/2017   Family dynamics problem 09/18/2017   Self-mutilation 09/18/2017   Work environment adverse effect 09/18/2017   Bereavement reaction 09/18/2017   Closed nondisplaced fracture of shaft of fourth metacarpal bone of right hand 04/04/2017   Right hand pain 04/04/2017   GAD (generalized anxiety disorder)  12/10/2015   Tinnitus 04/09/2015   Allergy 03/02/2015   BMI 36.0-36.9,adult 11/02/2014    Allergies:  Allergies  Allergen Reactions   Penicillins     Unknown   Medications:  Current Outpatient Medications:    benzonatate (TESSALON) 100 MG capsule, Take 1 capsule (100 mg total) by mouth 3 (three) times daily as needed for cough., Disp: 30 capsule, Rfl: 0   predniSONE  (DELTASONE) 20 MG tablet, Take 2 tablets (40 mg total) by mouth daily with breakfast., Disp: 10 tablet, Rfl: 0   albuterol (VENTOLIN HFA) 108 (90 Base) MCG/ACT inhaler, Inhale 2 puffs into the lungs every 6 (six) hours as needed for wheezing or shortness of breath., Disp: 8 g, Rfl: 11   dicyclomine (BENTYL) 10 MG capsule, Take 1 capsule (10 mg total) by mouth 4 (four) times daily -  before meals and at bedtime., Disp: 40 capsule, Rfl: 0   fluticasone (FLONASE) 50 MCG/ACT nasal spray, Place 1-2 sprays into both nostrils daily., Disp: 16 g, Rfl: 0   ibuprofen (ADVIL) 800 MG tablet, Take 1 tablet (800 mg total) by mouth 3 (three) times daily. (Patient not taking: Reported on 01/13/2023), Disp: 21 tablet, Rfl: 0   mometasone-formoterol (DULERA) 200-5 MCG/ACT AERO, Inhale 2 puffs into the lungs 2 (two) times daily., Disp: 13 g, Rfl: 3  Observations/Objective: Patient is well-developed, well-nourished in no acute distress.  Resting comfortably at home.  Head is normocephalic, atraumatic.  No labored breathing. Speech is clear and coherent with logical content.  Patient is alert and oriented at baseline.   Assessment and Plan: 1. Viral bronchitis - benzonatate (TESSALON) 100 MG capsule; Take 1 capsule (100 mg total) by mouth 3 (three) times daily as needed for cough.  Dispense: 30 capsule; Refill: 0 - predniSONE (DELTASONE) 20 MG tablet; Take 2 tablets (40 mg total) by mouth daily with breakfast.  Dispense: 10 tablet; Refill: 0  Supportive measures and OTC medications reviewed. Tessalon and Prednisone per orders. Albuterol and Dulera refilled as he is almost out. Strict in person follow-up precautions reviewed.   Follow Up Instructions: I discussed the assessment and treatment plan with the patient. The patient was provided an opportunity to ask questions and all were answered. The patient agreed with the plan and demonstrated an understanding of the instructions.  A copy of instructions were sent to  the patient via MyChart unless otherwise noted below.   The patient was advised to call back or seek an in-person evaluation if the symptoms worsen or if the condition fails to improve as anticipated.  Time:  I spent 10 minutes with the patient via telehealth technology discussing the above problems/concerns.    Russell Climes, PA-C

## 2023-09-12 NOTE — Patient Instructions (Signed)
Rondel Baton, thank you for joining Russell Climes, PA-C for today's virtual visit.  While this provider is not your primary care provider (PCP), if your PCP is located in our provider database this encounter information will be shared with them immediately following your visit.   A [facility] MyChart account gives you access to today's visit and all your visits, tests, and labs performed at [facility] " click here if you don't have a [facility] MyChart account or go to [email or web address]  Consent: (Patient) Russell [name] provided verbal consent for this virtual visit at the beginning of the encounter.  Current Medications:  Current Outpatient Medications:    albuterol (VENTOLIN HFA) 108 (90 Base) MCG/ACT inhaler, Inhale 2 puffs into the lungs every 6 (six) hours as needed for wheezing or shortness of breath., Disp: 8 g, Rfl: 11   dicyclomine (BENTYL) 10 MG capsule, Take 1 capsule (10 mg total) by mouth 4 (four) times daily -  before meals and at bedtime., Disp: 40 capsule, Rfl: 0   EPINEPHrine (EPIPEN) 0.3 mg/0.3 mL DEVI, Inject 0.3 mLs (0.3 mg total) into the muscle once., Disp: 1 Device, Rfl: 0   famotidine-calcium carbonate-magnesium hydroxide (PEPCID COMPLETE) 10-800-165 MG chewable tablet, Chew 1 tablet by mouth daily as needed., Disp: 100 tablet, Rfl: 11   fluticasone (FLONASE) 50 MCG/ACT nasal spray, Place 1-2 sprays into both nostrils daily., Disp: 16 g, Rfl: 0   ibuprofen (ADVIL) 800 MG tablet, Take 1 tablet (800 mg total) by mouth 3 (three) times daily. (Patient not taking: Reported on 01/13/2023), Disp: 21 tablet, Rfl: 0   loperamide (IMODIUM A-D) 2 MG tablet, Take 1 tablet (2 mg total) by mouth 4 (four) times daily as needed for diarrhea or loose stools., Disp: 30 tablet, Rfl: 0   meclizine (ANTIVERT) 25 MG tablet, Take 1 tablet (25 mg total) by mouth 3 (three) times daily as needed for dizziness., Disp: 30 tablet, Rfl: 0   mometasone-formoterol (DULERA)  200-5 MCG/ACT AERO, Inhale 2 puffs into the lungs 2 (two) times daily., Disp: 13 g, Rfl: 3   ondansetron (ZOFRAN) 4 MG tablet, Take 1 tablet (4 mg total) by mouth every 6 (six) hours., Disp: 12 tablet, Rfl: 0   ondansetron (ZOFRAN-ODT) 4 MG disintegrating tablet, Take 1 tablet (4 mg total) by mouth every 8 (eight) hours as needed., Disp: 20 tablet, Rfl: 0   oxiconazole (OXISTAT) 1 % CREA, Apply 1 application  topically 2 (two) times daily. (Patient not taking: Reported on 01/13/2023), Disp: 30 g, Rfl: 0   Medications ordered in this encounter:  No orders of the defined types were placed in this encounter.    *If you need refills on other medications prior to your next appointment, please contact your pharmacy*  Follow-Up: Call back or seek an in-person evaluation if the symptoms worsen or if the condition fails to improve as anticipated.  [facility] [phone]  Other Instructions Acute Bronchitis, Adult  Acute bronchitis is when air tubes in the lungs (bronchi) suddenly get swollen. The condition can make it hard for you to breathe. In adults, acute bronchitis usually goes away within 2 weeks. A cough caused by bronchitis may last up to 3 weeks. Smoking, allergies, and asthma can make the condition worse. What are the causes? Germs that cause cold and flu (viruses). The most common cause of this condition is the virus that causes the common cold. Bacteria. Substances that bother (irritate) the lungs, including: Smoke from cigarettes  and other types of tobacco. Dust and pollen. Fumes from chemicals, gases, or burned fuel. Indoor or outdoor air pollution. What increases the risk? A weak body's defense system. This is also called the immune system. Any condition that affects your lungs and breathing, such as asthma. What are the signs or symptoms? A cough. Coughing up clear, yellow, or green mucus. Making high-pitched whistling sounds when you breathe, most often when  you breathe out (wheezing). Runny or stuffy nose. Having too much mucus in your lungs (chest congestion). Shortness of breath. Body aches. A sore throat. How is this treated? Acute bronchitis may go away over time without treatment. Your doctor may tell you to: Drink more fluids. This will help thin your mucus so it is easier to cough up. Use a device that gets medicine into your lungs (inhaler). Use a vaporizer or a humidifier. These are machines that add water to the air. This helps with coughing and poor breathing. Take a medicine that thins mucus and helps clear it from your lungs. Take a medicine that prevents or stops coughing. It is not common to take an antibiotic medicine for this condition. Follow these instructions at home:  Take over-the-counter and prescription medicines only as told by your doctor. Use an inhaler, vaporizer, or humidifier as told by your doctor. Take two teaspoons (10 mL) of honey at bedtime. This helps lessen your coughing at night. Drink enough fluid to keep your pee (urine) pale yellow. Do not smoke or use any products that contain nicotine or tobacco. If you need help quitting, ask your doctor. Get a lot of rest. Return to your normal activities when your doctor says that it is safe. Keep all follow-up visits. How is this prevented?  Wash your hands often with soap and water for at least 20 seconds. If you cannot use soap and water, use hand sanitizer. Avoid contact with people who have cold symptoms. Try not to touch your mouth, nose, or eyes with your hands. Avoid breathing in smoke or chemical fumes. Make sure to get the flu shot every year. Contact a doctor if: Your symptoms do not get better in 2 weeks. You have trouble coughing up the mucus. Your cough keeps you awake at night. You have a fever. Get help right away if: You cough up blood. You have chest pain. You have very bad shortness of breath. You faint or keep feeling like you are  going to faint. You have a very bad headache. Your fever or chills get worse. These symptoms may be an emergency. Get help right away. Call your local emergency services (911 in the [place] Do not wait to see if the symptoms will go away. Do not drive yourself to the hospital. Summary Acute bronchitis is when air tubes in the lungs (bronchi) suddenly get swollen. In adults, acute bronchitis usually goes away within 2 weeks. Drink more fluids. This will help thin your mucus so it is easier to cough up. Take over-the-counter and prescription medicines only as told by your doctor. Contact a doctor if your symptoms do not improve after 2 weeks of treatment. This information is not intended to replace advice given to you by your health care provider. Make sure you discuss any questions you have with your health care provider. Document Revised: 04/07/2021 Document Reviewed: 04/07/2021 Elsevier Patient Education  2024 [employer].    If you have been instructed to have an in-person evaluation today at a local Urgent Care facility, please use the  link below. It will take you to a list of all of our available [facility], including address, phone number and hours of operation. Please do not delay care.  [facility]  If you or a family member do not have a primary care provider, use the link below to schedule a visit and establish care. When you choose a [facility] primary care physician or advanced practice provider, you gain a long-term partner in health. Find a Primary Care Provider  Learn more about [facility]'s in-office and virtual care options: [facility] - Get Care Now

## 2023-10-01 ENCOUNTER — Other Ambulatory Visit: Payer: Self-pay

## 2023-10-01 ENCOUNTER — Emergency Department (HOSPITAL_COMMUNITY): Payer: Self-pay

## 2023-10-01 ENCOUNTER — Emergency Department (HOSPITAL_COMMUNITY)
Admission: EM | Admit: 2023-10-01 | Discharge: 2023-10-01 | Disposition: A | Discharge status: Home / Self Care | Payer: Self-pay | Attending: Emergency Medicine | Admitting: Emergency Medicine

## 2023-10-01 DIAGNOSIS — Z7952 Long term (current) use of systemic steroids: Secondary | ICD-10-CM | POA: Insufficient documentation

## 2023-10-01 DIAGNOSIS — Z7951 Long term (current) use of inhaled steroids: Secondary | ICD-10-CM | POA: Insufficient documentation

## 2023-10-01 DIAGNOSIS — J4541 Moderate persistent asthma with (acute) exacerbation: Secondary | ICD-10-CM | POA: Insufficient documentation

## 2023-10-01 MED ORDER — IPRATROPIUM-ALBUTEROL 0.5-2.5 (3) MG/3ML IN SOLN
3.0000 mL | Freq: Once | RESPIRATORY_TRACT | Status: AC
  Administered 2023-10-01: 3 mL via RESPIRATORY_TRACT
  Filled 2023-10-01: qty 3

## 2023-10-01 MED ORDER — ALBUTEROL SULFATE HFA 108 (90 BASE) MCG/ACT IN AERS
2.0000 | INHALATION_SPRAY | Freq: Once | RESPIRATORY_TRACT | Status: AC
  Administered 2023-10-01: 2 via RESPIRATORY_TRACT
  Filled 2023-10-01: qty 6.7

## 2023-10-01 MED ORDER — ALBUTEROL SULFATE (2.5 MG/3ML) 0.083% IN NEBU: 2.5000 mg | INHALATION_SOLUTION | Freq: Four times a day (QID) | RESPIRATORY_TRACT | 0 refills | Status: DC | PRN

## 2023-10-01 NOTE — ED Triage Notes (Signed)
Pt c/o sob and chest discomfort with breathing. Pt is out of  home inhaler ems gave duoneb and solumedrol. 20 g lac. VSS with ems

## 2023-10-01 NOTE — Discharge Instructions (Addendum)
You were seen in the ER today for your shortness of breath. You are experiencing an asthma exacerbation. Please follow up with the clinic listed below and return to the ER with any new severe symptoms.

## 2023-10-01 NOTE — ED Provider Notes (Signed)
[place] EMERGENCY DEPARTMENT AT [facility] Provider Note   CSN: [id number] Arrival date & time: 10/01/23  2140     History {Add pertinent medical, surgical, social history, OB history to HPI:1} Chief Complaint  Patient presents with  . Shortness of Breath  . Chest Pain    Russell [name] is a 32 y.o. male.  HPI     Home Medications Prior to Admission medications   Medication Sig Start Date End Date Taking? Authorizing Provider  albuterol (VENTOLIN HFA) 108 (90 Base) MCG/ACT inhaler Inhale 2 puffs into the lungs every 6 (six) hours as needed for wheezing or shortness of breath. 09/12/23   Waldon Merl, PA-C  benzonatate (TESSALON) 100 MG capsule Take 1 capsule (100 mg total) by mouth 3 (three) times daily as needed for cough. 09/12/23   Waldon Merl, PA-C  dicyclomine (BENTYL) 10 MG capsule Take 1 capsule (10 mg total) by mouth 4 (four) times daily -  before meals and at bedtime. 02/27/23   Margaretann Loveless, PA-C  fluticasone (FLONASE) 50 MCG/ACT nasal spray Place 1-2 sprays into both nostrils daily. 10/08/20   Wieters, Hallie C, PA-C  ibuprofen (ADVIL) 800 MG tablet Take 1 tablet (800 mg total) by mouth 3 (three) times daily. Patient not taking: Reported on 01/13/2023 10/08/20   Wieters, [employer] C, PA-C  mometasone-formoterol (DULERA) 200-5 MCG/ACT AERO Inhale 2 puffs into the lungs 2 (two) times daily. 09/12/23   Waldon Merl, PA-C  predniSONE (DELTASONE) 20 MG tablet Take 2 tablets (40 mg total) by mouth daily with breakfast. 09/12/23   Waldon Merl, PA-C  famotidine (PEPCID) 20 MG tablet Take 1 tablet (20 mg total) by mouth 2 (two) times daily. 08/08/19 10/08/20  Wallis Bamberg, PA-C  Fluticasone-Salmeterol (ADVAIR DISKUS) 250-50 MCG/DOSE AEPB Inhale 1 puff into the lungs 2 (two) times daily. 08/15/19 10/08/20  Merrilee Jansky, MD  montelukast (SINGULAIR) 10 MG tablet Take 1 tablet (10 mg total) by mouth at bedtime. 03/20/20 10/08/20  Moshe Cipro, FNP      Allergies    Penicillins    Review of Systems   Review of Systems  Physical Exam Updated Vital Signs BP 136/83 (BP Location: Right Arm)   Pulse 93   Temp 97.7 F (36.5 C) (Oral)   Resp 11   SpO2 99%  Physical Exam  ED Results / Procedures / Treatments   Labs (all labs ordered are listed, but only abnormal results are displayed) Labs Reviewed - No data to display  EKG None  Radiology No results found.  Procedures Procedures  {Document cardiac monitor, telemetry assessment procedure when appropriate:1}  Medications Ordered in ED Medications - No data to display  ED Course/ Medical Decision Making/ A&P   {   Click here for ABCD2, HEART and other calculatorsREFRESH Note before signing :1}                              Medical Decision Making  ***  {Document critical care time when appropriate:1} {Document review of labs and clinical decision tools ie heart score, Chads2Vasc2 etc:1}  {Document your independent review of radiology images, and any outside records:1} {Document your discussion with family members, caretakers, and with consultants:1} {Document social determinants of health affecting pt's care:1} {Document your decision making why or why not admission, treatments were needed:1} Final Clinical Impression(s) / ED Diagnoses Final diagnoses:  None    Rx /  DC Orders ED Discharge Orders     None

## 2023-12-24 ENCOUNTER — Telehealth: Payer: Self-pay | Admitting: Physician Assistant

## 2023-12-24 DIAGNOSIS — J208 Acute bronchitis due to other specified organisms: Secondary | ICD-10-CM

## 2023-12-24 MED ORDER — BENZONATATE 100 MG PO CAPS: ORAL_CAPSULE | ORAL | 0 refills | Status: DC

## 2023-12-24 MED ORDER — ALBUTEROL SULFATE HFA 108 (90 BASE) MCG/ACT IN AERS: 2.0000 | INHALATION_SPRAY | Freq: Four times a day (QID) | RESPIRATORY_TRACT | 1 refills | Status: DC | PRN

## 2023-12-24 NOTE — Progress Notes (Signed)
 Virtual Visit Consent   Russell [name], you are scheduled for a virtual visit with a [facility] provider today. Just as with appointments in the office, your consent must be obtained to participate. Your consent will be active for this visit and any virtual visit you may have with one of our providers in the next 365 days. If you have a MyChart account, a copy of this consent can be sent to you electronically.  As this is a virtual visit, video technology does not allow for your provider to perform a traditional examination. This may limit your provider's ability to fully assess your condition. If your provider identifies any concerns that need to be evaluated in person or the need to arrange testing (such as labs, EKG, etc.), we will make arrangements to do so. Although advances in technology are sophisticated, we cannot ensure that it will always work on either your end or our end. If the connection with a video visit is poor, the visit may have to be switched to a telephone visit. With either a video or telephone visit, we are not always able to ensure that we have a secure connection.  By engaging in this virtual visit, you consent to the provision of healthcare and authorize for your insurance to be billed (if applicable) for the services provided during this visit. Depending on your insurance coverage, you may receive a charge related to this service.  I need to obtain your verbal consent now. Are you willing to proceed with your visit today? Russell [name] has provided verbal consent on 12/24/2023 for a virtual visit (video or telephone). Kirk GORMAN Sage, PA-C  Date: 12/24/2023 6:44 PM  Virtual Visit via Video Note   I, Russell [name], connected with  Russell [name]  ([id number], 12/01/1991) on 12/24/23 at  4:30 PM EST by a video-enabled telemedicine application and verified that I am speaking with the correct person using two identifiers.  Location: Patient: Virtual Visit Location Patient:  Home Provider: Virtual Visit Location Provider: Home Office   I discussed the limitations of evaluation and management by telemedicine and the availability of in person appointments. The patient expressed understanding and agreed to proceed.    History of Present Illness: Russell [name] is a 33 y.o. who identifies as a male who was assigned male at birth, and presents with symptoms that began four days ago. She woke up with neck pain, which progressed to severe body aches and chills throughout the day. She was bedridden from Thursday until Saturday due to the severity of the symptoms. She has been experiencing a persistent cough, which she describes as the most bothersome symptom. She reports minimal expectoration with the cough. She also reports experiencing fevers, with a peak temperature of 102-103   She has a history of asthma, which has been exacerbated by the current illness. She has been using an inhaler, but she has run out. She has been taking over-the-counter medications and hot showers to alleviate symptoms, along with Advil  for body pains and headaches. She has also been drinking hot tea to manage her asthma symptoms.  She has not been eating, but she has been drinking a lot of juice. She has previously tested positive for COVID-19 and describes the current illness as feeling similar to her past experiences with COVID-19. She has not taken a home test for the current illness.       Problems:  Patient Active Problem List   Diagnosis Date Noted  Loud snoring 02/16/2023   SOB (shortness of breath) 02/16/2023   Excessive daytime sleepiness 02/16/2023   Asthma 01/13/2023   Left elbow pain 01/13/2023   Shoulder impingement, left 01/13/2023   Other fatigue 01/13/2023   Nausea and vomiting 01/13/2023   Weakness 01/13/2023   Sleep apnea 01/13/2023   Adjustment disorder with anxious mood 10/20/2017   Bipolar 1 disorder, depressed, moderate (HCC) 09/22/2017   Family dynamics problem  09/18/2017   Self-mutilation 09/18/2017   Work environment adverse effect 09/18/2017   Bereavement reaction 09/18/2017   Closed nondisplaced fracture of shaft of fourth metacarpal bone of right hand 04/04/2017   Right hand pain 04/04/2017   GAD (generalized anxiety disorder) 12/10/2015   Tinnitus 04/09/2015   Allergy 03/02/2015   BMI 36.0-36.9,adult 11/02/2014    Allergies:  Allergies  Allergen Reactions   Penicillins     Unknown   Medications:  Current Outpatient Medications:    benzonatate  (TESSALON ) 100 MG capsule, Take 1-2 caps PO TID PRN, Disp: 20 capsule, Rfl: 0   albuterol  (VENTOLIN  HFA) 108 (90 Base) MCG/ACT inhaler, Inhale 2 puffs into the lungs every 6 (six) hours as needed for wheezing or shortness of breath., Disp: 8 g, Rfl: 1   dicyclomine  (BENTYL ) 10 MG capsule, Take 1 capsule (10 mg total) by mouth 4 (four) times daily -  before meals and at bedtime., Disp: 40 capsule, Rfl: 0   fluticasone  (FLONASE ) 50 MCG/ACT nasal spray, Place 1-2 sprays into both nostrils daily., Disp: 16 g, Rfl: 0   ibuprofen  (ADVIL ) 800 MG tablet, Take 1 tablet (800 mg total) by mouth 3 (three) times daily. (Patient not taking: Reported on 01/13/2023), Disp: 21 tablet, Rfl: 0   mometasone -formoterol  (DULERA ) 200-5 MCG/ACT AERO, Inhale 2 puffs into the lungs 2 (two) times daily., Disp: 13 g, Rfl: 0   predniSONE  (DELTASONE ) 20 MG tablet, Take 2 tablets (40 mg total) by mouth daily with breakfast., Disp: 10 tablet, Rfl: 0  Observations/Objective: Patient is well-developed, well-nourished in no acute distress.  Resting comfortably  at home.  Head is normocephalic, atraumatic.  No labored breathing.  Speech is clear and coherent with logical content.  Patient is alert and oriented at baseline.    Assessment and Plan: 1. Viral bronchitis - albuterol  (VENTOLIN  HFA) 108 (90 Base) MCG/ACT inhaler; Inhale 2 puffs into the lungs every 6 (six) hours as needed for wheezing or shortness of breath.   Dispense: 8 g; Refill: 1 - benzonatate  (TESSALON ) 100 MG capsule; Take 1-2 caps PO TID PRN  Dispense: 20 capsule; Refill: 0   -Continue supportive care with hydration and rest. -Refill inhaler prescription. .  Follow Up Instructions: I discussed the assessment and treatment plan with the patient. The patient was provided an opportunity to ask questions and all were answered. The patient agreed with the plan and demonstrated an understanding of the instructions.  A copy of instructions were sent to the patient via MyChart unless otherwise noted below.     The patient was advised to call back or seek an in-person evaluation if the symptoms worsen or if the condition fails to improve as anticipated.    Kirk RAMAN Mayers, PA-C

## 2023-12-28 NOTE — Patient Instructions (Signed)
 Olof E Weekly, thank you for joining Kirk GORMAN Sage, PA-C for today's virtual visit.  While this provider is not your primary care provider (PCP), if your PCP is located in our provider database this encounter information will be shared with them immediately following your visit.   A [facility] MyChart account gives you access to today's visit and all your visits, tests, and labs performed at [facility]  click here if you don't have a [facility] MyChart account or go to [email or web address]  Consent: (Patient) Russell [name] provided verbal consent for this virtual visit at the beginning of the encounter.  Current Medications:  Current Outpatient Medications:    benzonatate  (TESSALON ) 100 MG capsule, Take 1-2 caps PO TID PRN, Disp: 20 capsule, Rfl: 0   albuterol  (VENTOLIN  HFA) 108 (90 Base) MCG/ACT inhaler, Inhale 2 puffs into the lungs every 6 (six) hours as needed for wheezing or shortness of breath., Disp: 8 g, Rfl: 1   dicyclomine  (BENTYL ) 10 MG capsule, Take 1 capsule (10 mg total) by mouth 4 (four) times daily -  before meals and at bedtime., Disp: 40 capsule, Rfl: 0   fluticasone  (FLONASE ) 50 MCG/ACT nasal spray, Place 1-2 sprays into both nostrils daily., Disp: 16 g, Rfl: 0   ibuprofen  (ADVIL ) 800 MG tablet, Take 1 tablet (800 mg total) by mouth 3 (three) times daily. (Patient not taking: Reported on 01/13/2023), Disp: 21 tablet, Rfl: 0   mometasone -formoterol  (DULERA ) 200-5 MCG/ACT AERO, Inhale 2 puffs into the lungs 2 (two) times daily., Disp: 13 g, Rfl: 0   predniSONE  (DELTASONE ) 20 MG tablet, Take 2 tablets (40 mg total) by mouth daily with breakfast., Disp: 10 tablet, Rfl: 0   Medications ordered in this encounter:  Meds ordered this encounter  Medications   albuterol  (VENTOLIN  HFA) 108 (90 Base) MCG/ACT inhaler    Sig: Inhale 2 puffs into the lungs every 6 (six) hours as needed for wheezing or shortness of breath.    Dispense:  8 g    Refill:  1     Supervising Provider:   BLAISE ALEENE KIDD [id number]   benzonatate  (TESSALON ) 100 MG capsule    Sig: Take 1-2 caps PO TID PRN    Dispense:  20 capsule    Refill:  0    Supervising Provider:   LAMPTEY, PHILIP O [[id number]]     *If you need refills on other medications prior to your next appointment, please contact your pharmacy*  Follow-Up: Call back or seek an in-person evaluation if the symptoms worsen or if the condition fails to improve as anticipated.  [facility] [phone]  Other Instructions Cough, Adult Coughing is a reflex that clears your throat and airways (respiratory system). It helps heal and protect your lungs. It is normal to cough from time to time. A cough that happens with other symptoms or that lasts a long time may be a sign of a condition that needs treatment. A short-term (acute) cough may only last 2-3 weeks. A long-term (chronic) cough may last 8 or more weeks. Coughing is often caused by: Diseases, such as: An infection of the respiratory system. Asthma or other heart or lung diseases. Gastroesophageal reflux. This is when acid comes back up from the stomach. Breathing in things that irritate your lungs. Allergies. Postnasal drip. This is when mucus runs down the back of your throat. Smoking. Some medicines. Follow these instructions at home: Medicines Take over-the-counter and prescription medicines only as told by your  health care provider. Talk with your provider before you take cough medicine (cough suppressants). Eating and drinking Do not drink alcohol. Avoid caffeine. Drink enough fluid to keep your pee (urine) pale yellow. Lifestyle Avoid cigarette smoke. Do not use any products that contain nicotine or tobacco. These products include cigarettes, chewing tobacco, and vaping devices, such as e-cigarettes. If you need help quitting, ask your provider. Avoid things that make you cough. These may include perfumes, candles, cleaning  products, or campfire smoke. General instructions  Watch for any changes to your cough. Tell your provider about them. Always cover your mouth when you cough. If the air is dry in your bedroom or home, use a cool mist vaporizer or humidifier. If your cough is worse at night, try to sleep in a semi-upright position. Rest as needed. Contact a health care provider if: You have new symptoms, or your symptoms get worse. You cough up pus. You have a fever that does not go away or a cough that does not get better after 2-3 weeks. You cannot control your cough with medicine, and you are losing sleep. You have pain that gets worse or is not helped with medicine. You lose weight for no clear reason. You have night sweats. Get help right away if: You cough up blood. You have trouble breathing. Your heart is beating very fast. These symptoms may be an emergency. Get help right away. Call 911. Do not wait to see if the symptoms will go away. Do not drive yourself to the hospital. This information is not intended to replace advice given to you by your health care provider. Make sure you discuss any questions you have with your health care provider. Document Revised: 08/05/2022 Document Reviewed: 08/05/2022 Elsevier Patient Education  2024 Elsevier Inc.    If you have been instructed to have an in-person evaluation today at a local Urgent Care facility, please use the link below. It will take you to a list of all of our available [facility], including address, phone number and hours of operation. Please do not delay care.  [facility]  If you or a family member do not have a primary care provider, use the link below to schedule a visit and establish care. When you choose a [facility] primary care physician or advanced practice provider, you gain a long-term partner in health. Find a Primary Care Provider  Learn more about [facility]'s in-office and virtual care  options: [facility] - Get Care Now

## 2024-02-06 ENCOUNTER — Telehealth: Payer: Self-pay

## 2024-02-06 ENCOUNTER — Telehealth: Payer: Self-pay | Admitting: Physician Assistant

## 2024-02-06 DIAGNOSIS — J4541 Moderate persistent asthma with (acute) exacerbation: Secondary | ICD-10-CM

## 2024-02-06 MED ORDER — PREDNISONE 20 MG PO TABS: 40.0000 mg | ORAL_TABLET | Freq: Every day | ORAL | 0 refills | Status: DC

## 2024-02-06 MED ORDER — ALBUTEROL SULFATE HFA 108 (90 BASE) MCG/ACT IN AERS: 1.0000 | INHALATION_SPRAY | Freq: Four times a day (QID) | RESPIRATORY_TRACT | 0 refills | Status: DC | PRN

## 2024-02-06 NOTE — Addendum Note (Signed)
Addended by: Margaretann Loveless on: 02/06/2024 05:41 PM   Modules accepted: Orders

## 2024-02-06 NOTE — Progress Notes (Signed)
 E-Visit for Asthma  Based on what you have shared with me, it looks like you may have a flare up of your asthma.  Asthma is a chronic (ongoing) lung disease which results in airway obstruction, inflammation and hyper-responsiveness.   Asthma symptoms vary from person to person, with common symptoms including nighttime awakening and decreased ability to participate in normal activities as a result of shortness of breath. It is often triggered by changes in weather, changes in the season, changes in air temperature, or inside (home, school, daycare or work) allergens such as animal dander, mold, mildew, woodstoves or cockroaches.   It can also be triggered by hormonal changes, extreme emotion, physical exertion or an upper respiratory tract illness.     It is important to identify the trigger, and then eliminate or avoid the trigger if possible.   If you have been prescribed medications to be taken on a regular basis, it is important to follow the asthma action plan and to follow guidelines to adjust medication in response to increasing symptoms of decreased peak expiratory flow rate  Treatment: I have prescribed: Prednisone 40mg  by mouth per day for 5 - 7 days  HOME CARE Only take medications as instructed by your medical team. Consider wearing a mask or scarf to improve breathing air temperature have been shown to decrease irritation and decrease exacerbations Get rest. Taking a steamy shower or using a humidifier may help nasal congestion sand ease sore throat pain. You can place a towel over your head and breathe in the steam from hot water coming from a faucet. Using a saline nasal spray works much the same way.  Cough drops, hare candies and sore throat lozenges may ease your cough.  Avoid close contacts especially the very you and the elderly Cover your mouth if you cough or  sneeze Always remember to wash your hands.    GET HELP RIGHT AWAY IF: You develop worsening symptoms; breathlessness at rest, drowsy, confused or agitated, unable to speak in full sentences You have coughing fits You develop a severe headache or visual changes You develop shortness of breath, difficulty breathing or start having chest pain Your symptoms persist after you have completed your treatment plan If your symptoms do not improve within 10 days  MAKE SURE YOU Understand these instructions. Will watch your condition. Will get help right away if you are not doing well or get worse.   Your e-visit answers were reviewed by a board certified advanced clinical practitioner to complete your personal care plan, Depending upon the condition, your plan could have included both over the counter or prescription medications.   Please review your pharmacy choice. Your safety is important to [place]. If you have drug allergies check your prescription carefully.  You can use MyChart to ask questions about today's visit, request a non-urgent  call back, or ask for a work or school excuse for 24 hours related to this e-Visit. If it has been greater than 24 hours you will need to follow up with your provider, or enter a new e-Visit to address those concerns.   You will get an e-mail in the next two days asking about your experience. I hope that your e-visit has been valuable and will speed your recovery. Thank you for using e-visits.  I have spent 5 minutes in review of e-visit questionnaire, review and updating patient chart, medical decision making and response to patient.   Margaretann Loveless, PA-C

## 2024-03-29 ENCOUNTER — Telehealth: Payer: Self-pay | Admitting: Physician Assistant

## 2024-03-29 DIAGNOSIS — J301 Allergic rhinitis due to pollen: Secondary | ICD-10-CM

## 2024-03-29 DIAGNOSIS — J4541 Moderate persistent asthma with (acute) exacerbation: Secondary | ICD-10-CM

## 2024-03-29 MED ORDER — PREDNISONE 20 MG PO TABS: 40.0000 mg | ORAL_TABLET | Freq: Every day | ORAL | 0 refills | Status: DC

## 2024-03-29 MED ORDER — CETIRIZINE HCL 10 MG PO TABS: 10.0000 mg | ORAL_TABLET | Freq: Every day | ORAL | 0 refills | Status: DC

## 2024-03-29 MED ORDER — ALBUTEROL SULFATE HFA 108 (90 BASE) MCG/ACT IN AERS: 1.0000 | INHALATION_SPRAY | Freq: Four times a day (QID) | RESPIRATORY_TRACT | 0 refills | Status: DC | PRN

## 2024-03-29 MED ORDER — FLUTICASONE PROPIONATE 50 MCG/ACT NA SUSP: 2.0000 | Freq: Every day | NASAL | 0 refills | Status: AC

## 2024-03-29 NOTE — Patient Instructions (Addendum)
 Rondel Baton, thank you for joining Margaretann Loveless, PA-C for today's virtual visit.  While this provider is not your primary care provider (PCP), if your PCP is located in our provider database this encounter information will be shared with them immediately following your visit.   A [facility] MyChart account gives you access to today's visit and all your visits, tests, and labs performed at [facility] " click here if you don't have a [facility] MyChart account or go to [email or web address]  Consent: (Patient) MARQUIES WANAT provided verbal consent for this virtual visit at the beginning of the encounter.  Current Medications:  Current Outpatient Medications:    albuterol (VENTOLIN HFA) 108 (90 Base) MCG/ACT inhaler, Inhale 1-2 puffs into the lungs every 6 (six) hours as needed., Disp: 8 g, Rfl: 0   cetirizine (ZYRTEC) 10 MG tablet, Take 1 tablet (10 mg total) by mouth daily., Disp: 90 tablet, Rfl: 0   fluticasone (FLONASE) 50 MCG/ACT nasal spray, Place 2 sprays into both nostrils daily., Disp: 16 g, Rfl: 0   predniSONE (DELTASONE) 20 MG tablet, Take 2 tablets (40 mg total) by mouth daily with breakfast., Disp: 14 tablet, Rfl: 0   benzonatate (TESSALON) 100 MG capsule, Take 1-2 caps PO TID PRN, Disp: 20 capsule, Rfl: 0   dicyclomine (BENTYL) 10 MG capsule, Take 1 capsule (10 mg total) by mouth 4 (four) times daily -  before meals and at bedtime., Disp: 40 capsule, Rfl: 0   ibuprofen (ADVIL) 800 MG tablet, Take 1 tablet (800 mg total) by mouth 3 (three) times daily. (Patient not taking: Reported on 01/13/2023), Disp: 21 tablet, Rfl: 0   mometasone-formoterol (DULERA) 200-5 MCG/ACT AERO, Inhale 2 puffs into the lungs 2 (two) times daily., Disp: 13 g, Rfl: 0   Medications ordered in this encounter:  Meds ordered this encounter  Medications   albuterol (VENTOLIN HFA) 108 (90 Base) MCG/ACT inhaler    Sig: Inhale 1-2 puffs into the lungs every 6 (six) hours as needed.     Dispense:  8 g    Refill:  0    Supervising Provider:   Merrilee Jansky [[id number]]   predniSONE (DELTASONE) 20 MG tablet    Sig: Take 2 tablets (40 mg total) by mouth daily with breakfast.    Dispense:  14 tablet    Refill:  0    Supervising Provider:   Merrilee Jansky [[id number]]   cetirizine (ZYRTEC) 10 MG tablet    Sig: Take 1 tablet (10 mg total) by mouth daily.    Dispense:  90 tablet    Refill:  0    Supervising Provider:   Merrilee Jansky [[id number]]   fluticasone (FLONASE) 50 MCG/ACT nasal spray    Sig: Place 2 sprays into both nostrils daily.    Dispense:  16 g    Refill:  0    Supervising Provider:   Merrilee Jansky [[id number]]     *If you need refills on other medications prior to your next appointment, please contact your pharmacy*  Follow-Up: Call back or seek an in-person evaluation if the symptoms worsen or if the condition fails to improve as anticipated.  [facility] [phone]  Other Instructions  [facility] Phone: [phone] Address: [address] [place], [place] [address] Hours: Open 24/7, No appointment required.  Billing/Financial Aid: [email or web address]   If you have been instructed to have an in-person evaluation today at a local Urgent  Care facility, please use the link below. It will take you to a list of all of our available [facility], including address, phone number and hours of operation. Please do not delay care.  [facility]  If you or a family member do not have a primary care provider, use the link below to schedule a visit and establish care. When you choose a [facility] primary care physician or advanced practice provider, you gain a long-term partner in health. Find a Primary Care Provider  Learn more about [facility]'s in-office and virtual care options: [facility] - Get Care  Now

## 2024-03-29 NOTE — Progress Notes (Signed)
 Virtual Visit Consent   TAKESHI [name], you are scheduled for a virtual visit with a [facility] provider today. Just as with appointments in the office, your consent must be obtained to participate. Your consent will be active for this visit and any virtual visit you may have with one of our providers in the next 365 days. If you have a MyChart account, a copy of this consent can be sent to you electronically.  As this is a virtual visit, video technology does not allow for your provider to perform a traditional examination. This may limit your provider's ability to fully assess your condition. If your provider identifies any concerns that need to be evaluated in person or the need to arrange testing (such as labs, EKG, etc.), we will make arrangements to do so. Although advances in technology are sophisticated, we cannot ensure that it will always work on either your end or our end. If the connection with a video visit is poor, the visit may have to be switched to a telephone visit. With either a video or telephone visit, we are not always able to ensure that we have a secure connection.  By engaging in this virtual visit, you consent to the provision of healthcare and authorize for your insurance to be billed (if applicable) for the services provided during this visit. Depending on your insurance coverage, you may receive a charge related to this service.  I need to obtain your verbal consent now. Are you willing to proceed with your visit today? Russell [name] has provided verbal consent on 03/29/2024 for a virtual visit (video or telephone). Russell Loveless, PA-C  Date: 03/29/2024 12:45 PM   Virtual Visit via Video Note   I, Russell [name], connected with  Russell [name]  ([id number], 28-Apr-1991) on 03/29/24 at 12:30 PM EDT by a video-enabled telemedicine application and verified that I am speaking with the correct person using two identifiers.  Location: Patient: Virtual Visit  Location Patient: Home Provider: Virtual Visit Location Provider: Home Office   I discussed the limitations of evaluation and management by telemedicine and the availability of in person appointments. The patient expressed understanding and agreed to proceed.    History of Present Illness: Russell [name] is a 33 y.o. who identifies as a male who was assigned male at birth, and is being seen today for asthma exacerbation.  HPI: Asthma He complains of chest tightness, cough, frequent throat clearing and shortness of breath. This is a recurrent problem. Episode onset: Last on prednisone 01/2024; Started bothering him again 2 weeks ago. The problem occurs constantly. The problem has been gradually worsening. The cough is dry. Associated symptoms include dyspnea on exertion, ear congestion (pressure), headaches, nasal congestion, postnasal drip, rhinorrhea and sneezing. Pertinent negatives include no chest pain, ear pain, fever, malaise/fatigue, myalgias, PND, sore throat or trouble swallowing. His symptoms are aggravated by change in weather and pollen. His symptoms are alleviated by beta-agonist. He reports minimal improvement on treatment. His symptoms are not alleviated by beta-agonist (loratadine and albuterol). There are no known risk factors for lung disease. His past medical history is significant for asthma.     Problems:  Patient Active Problem List   Diagnosis Date Noted   Loud snoring 02/16/2023   SOB (shortness of breath) 02/16/2023   Excessive daytime sleepiness 02/16/2023   Asthma 01/13/2023   Left elbow pain 01/13/2023   Shoulder impingement, left 01/13/2023   Other fatigue 01/13/2023   Nausea and vomiting  01/13/2023   Weakness 01/13/2023   Sleep apnea 01/13/2023   Adjustment disorder with anxious mood 10/20/2017   Bipolar 1 disorder, depressed, moderate (HCC) 09/22/2017   Family dynamics problem 09/18/2017   Self-mutilation 09/18/2017   Work environment adverse effect  09/18/2017   Bereavement reaction 09/18/2017   Closed nondisplaced fracture of shaft of fourth metacarpal bone of right hand 04/04/2017   Right hand pain 04/04/2017   GAD (generalized anxiety disorder) 12/10/2015   Tinnitus 04/09/2015   Allergy 03/02/2015   BMI 36.0-36.9,adult 11/02/2014    Allergies:  Allergies  Allergen Reactions   Penicillins     Unknown   Medications:  Current Outpatient Medications:    albuterol (VENTOLIN HFA) 108 (90 Base) MCG/ACT inhaler, Inhale 1-2 puffs into the lungs every 6 (six) hours as needed., Disp: 8 g, Rfl: 0   cetirizine (ZYRTEC) 10 MG tablet, Take 1 tablet (10 mg total) by mouth daily., Disp: 90 tablet, Rfl: 0   fluticasone (FLONASE) 50 MCG/ACT nasal spray, Place 2 sprays into both nostrils daily., Disp: 16 g, Rfl: 0   predniSONE (DELTASONE) 20 MG tablet, Take 2 tablets (40 mg total) by mouth daily with breakfast., Disp: 14 tablet, Rfl: 0   benzonatate (TESSALON) 100 MG capsule, Take 1-2 caps PO TID PRN, Disp: 20 capsule, Rfl: 0   dicyclomine (BENTYL) 10 MG capsule, Take 1 capsule (10 mg total) by mouth 4 (four) times daily -  before meals and at bedtime., Disp: 40 capsule, Rfl: 0   ibuprofen (ADVIL) 800 MG tablet, Take 1 tablet (800 mg total) by mouth 3 (three) times daily. (Patient not taking: Reported on 01/13/2023), Disp: 21 tablet, Rfl: 0   mometasone-formoterol (DULERA) 200-5 MCG/ACT AERO, Inhale 2 puffs into the lungs 2 (two) times daily., Disp: 13 g, Rfl: 0  Observations/Objective: Patient is well-developed, well-nourished in no acute distress.  Resting comfortably at home.  Head is normocephalic, atraumatic.  No labored breathing.  Speech is clear and coherent with logical content.  Patient is alert and oriented at baseline.    Assessment and Plan: 1. Moderate persistent asthma with exacerbation (Primary) - albuterol (VENTOLIN HFA) 108 (90 Base) MCG/ACT inhaler; Inhale 1-2 puffs into the lungs every 6 (six) hours as needed.  Dispense: 8  g; Refill: 0 - predniSONE (DELTASONE) 20 MG tablet; Take 2 tablets (40 mg total) by mouth daily with breakfast.  Dispense: 14 tablet; Refill: 0  2. Seasonal allergic rhinitis due to pollen - predniSONE (DELTASONE) 20 MG tablet; Take 2 tablets (40 mg total) by mouth daily with breakfast.  Dispense: 14 tablet; Refill: 0 - cetirizine (ZYRTEC) 10 MG tablet; Take 1 tablet (10 mg total) by mouth daily.  Dispense: 90 tablet; Refill: 0 - fluticasone (FLONASE) 50 MCG/ACT nasal spray; Place 2 sprays into both nostrils daily.  Dispense: 16 g; Refill: 0  - Asthma exacerbation and allergies - Refill Albuterol - Add prednisone burst for exacerbation - Add Flonase and Zyrtec for seasonal allergies - Saline nasal rinses - Steam and Humidifier can help - Push fluids - Seek in person evaluation if not improving  - Discussed establishing with PCP for long-term management of asthma (discussed link in AVS) - Discussed [facility] for mental health complications of recent (Does reside in [place]) - Will provide information for financial office to discuss options available for self pay patients  Follow Up Instructions: I discussed the assessment and treatment plan with the patient. The patient was provided an opportunity to ask questions and all were  answered. The patient agreed with the plan and demonstrated an understanding of the instructions.  A copy of instructions were sent to the patient via MyChart unless otherwise noted below.    The patient was advised to call back or seek an in-person evaluation if the symptoms worsen or if the condition fails to improve as anticipated.    Russell Loveless, PA-C

## 2024-04-29 ENCOUNTER — Telehealth: Payer: Self-pay | Admitting: Nurse Practitioner

## 2024-04-29 DIAGNOSIS — J4541 Moderate persistent asthma with (acute) exacerbation: Secondary | ICD-10-CM

## 2024-04-29 DIAGNOSIS — J301 Allergic rhinitis due to pollen: Secondary | ICD-10-CM

## 2024-04-29 DIAGNOSIS — J0140 Acute pansinusitis, unspecified: Secondary | ICD-10-CM

## 2024-04-29 MED ORDER — DOXYCYCLINE HYCLATE 100 MG PO TABS: 100.0000 mg | ORAL_TABLET | Freq: Two times a day (BID) | ORAL | 0 refills | Status: AC

## 2024-04-29 MED ORDER — ALBUTEROL SULFATE HFA 108 (90 BASE) MCG/ACT IN AERS: 1.0000 | INHALATION_SPRAY | Freq: Four times a day (QID) | RESPIRATORY_TRACT | 0 refills | Status: DC | PRN

## 2024-04-29 MED ORDER — CETIRIZINE HCL 10 MG PO TABS: 10.0000 mg | ORAL_TABLET | Freq: Every day | ORAL | 0 refills | Status: DC

## 2024-04-29 MED ORDER — PREDNISONE 10 MG (21) PO TBPK: ORAL_TABLET | ORAL | 0 refills | Status: DC

## 2024-04-29 NOTE — Patient Instructions (Addendum)
 [phone]  [phone] for therapy through [facility]    Adult medicaid application  [email or web address]

## 2024-04-29 NOTE — Progress Notes (Signed)
 Virtual Visit Consent   Russell [name], you are scheduled for a virtual visit with a [facility] provider today. Just as with appointments in the office, your consent must be obtained to participate. Your consent will be active for this visit and any virtual visit you may have with one of our providers in the next 365 days. If you have a MyChart account, a copy of this consent can be sent to you electronically.  As this is a virtual visit, video technology does not allow for your provider to perform a traditional examination. This may limit your provider's ability to fully assess your condition. If your provider identifies any concerns that need to be evaluated in person or the need to arrange testing (such as labs, EKG, etc.), we will make arrangements to do so. Although advances in technology are sophisticated, we cannot ensure that it will always work on either your end or our end. If the connection with a video visit is poor, the visit may have to be switched to a telephone visit. With either a video or telephone visit, we are not always able to ensure that we have a secure connection.  By engaging in this virtual visit, you consent to the provision of healthcare and authorize for your insurance to be billed (if applicable) for the services provided during this visit. Depending on your insurance coverage, you may receive a charge related to this service.  I need to obtain your verbal consent now. Are you willing to proceed with your visit today? Russell [name] has provided verbal consent on 04/29/2024 for a virtual visit (video or telephone). Russell Shake, FNP  Date: 04/29/2024 4:33 PM   Virtual Visit via Video Note   I, Russell [name], connected with  Russell [name]  ([id number], February 15, 1991) on 04/29/24 at  5:00 PM EDT by a video-enabled telemedicine application and verified that I am speaking with the correct person using two identifiers.  Location: Patient: Virtual Visit Location Patient:  Home Provider: Virtual Visit Location Provider: Home Office   I discussed the limitations of evaluation and management by telemedicine and the availability of in person appointments. The patient expressed understanding and agreed to proceed.    History of Present Illness: Russell [name] is a 33 y.o. who identifies as a male who was assigned male at birth, and is being seen today for a headache and trouble eating.   04/20/24 he had URI symptoms of sinus congestion that seem to be improving but he has had a persisting headache since that time.   The headache is "all over" it is present even when he is sleeping   Denies a fever with the illness Denies sore throat  Mainly sinus congestion  Mild cough   He has used Dayquil and Nyquil along with an extra strength "headache relief" OTC The headache relief medicine seems to provide the most relief: active ingredients include tylenol /Asprin 250mg  & caffeine  He does have an increase in severity of HA when he bends forward/puts shoes on   He has also noted a lack of appetite since onset of illness Denies nausea or vomiting just lack of interest in food  Denies heartburn or reflux  Feels his loss of appetite may be related to life stressors    He is out of his Dulera  currently because he lost his insurance  Has increased his use of Albuterol  due to lack of maintenance inhaler     Problems:  Patient Active Problem List  Diagnosis Date Noted   Loud snoring 02/16/2023   SOB (shortness of breath) 02/16/2023   Excessive daytime sleepiness 02/16/2023   Asthma 01/13/2023   Left elbow pain 01/13/2023   Shoulder impingement, left 01/13/2023   Other fatigue 01/13/2023   Nausea and vomiting 01/13/2023   Weakness 01/13/2023   Sleep apnea 01/13/2023   Adjustment disorder with anxious mood 10/20/2017   Bipolar 1 disorder, depressed, moderate (HCC) 09/22/2017   Family dynamics problem 09/18/2017   Self-mutilation 09/18/2017   Work environment  adverse effect 09/18/2017   Bereavement reaction 09/18/2017   Closed nondisplaced fracture of shaft of fourth metacarpal bone of right hand 04/04/2017   Right hand pain 04/04/2017   GAD (generalized anxiety disorder) 12/10/2015   Tinnitus 04/09/2015   Allergy 03/02/2015   BMI 36.0-36.9,adult 11/02/2014    Allergies:  Allergies  Allergen Reactions   Penicillins     Unknown   Medications:  Current Outpatient Medications:    albuterol  (VENTOLIN  HFA) 108 (90 Base) MCG/ACT inhaler, Inhale 1-2 puffs into the lungs every 6 (six) hours as needed., Disp: 8 g, Rfl: 0   benzonatate  (TESSALON ) 100 MG capsule, Take 1-2 caps PO TID PRN, Disp: 20 capsule, Rfl: 0   cetirizine  (ZYRTEC ) 10 MG tablet, Take 1 tablet (10 mg total) by mouth daily., Disp: 90 tablet, Rfl: 0   dicyclomine  (BENTYL ) 10 MG capsule, Take 1 capsule (10 mg total) by mouth 4 (four) times daily -  before meals and at bedtime., Disp: 40 capsule, Rfl: 0   fluticasone  (FLONASE ) 50 MCG/ACT nasal spray, Place 2 sprays into both nostrils daily., Disp: 16 g, Rfl: 0   ibuprofen  (ADVIL ) 800 MG tablet, Take 1 tablet (800 mg total) by mouth 3 (three) times daily. (Patient not taking: Reported on 01/13/2023), Disp: 21 tablet, Rfl: 0   mometasone -formoterol  (DULERA ) 200-5 MCG/ACT AERO, Inhale 2 puffs into the lungs 2 (two) times daily., Disp: 13 g, Rfl: 0   predniSONE  (DELTASONE ) 20 MG tablet, Take 2 tablets (40 mg total) by mouth daily with breakfast., Disp: 14 tablet, Rfl: 0  Observations/Objective: Patient is well-developed, well-nourished in no acute distress.  Resting comfortably  at home.  Head is normocephalic, atraumatic.  No labored breathing.  Speech is clear and coherent with logical content.  Patient is alert and oriented at baseline.    Assessment and Plan:  1. Moderate persistent asthma with exacerbation  2. Seasonal allergic rhinitis due to pollen  3. Acute non-recurrent pansinusitis  Meds ordered this encounter   Medications   albuterol  (VENTOLIN  HFA) 108 (90 Base) MCG/ACT inhaler    Sig: Inhale 1-2 puffs into the lungs every 6 (six) hours as needed.    Dispense:  8 g    Refill:  0   predniSONE  (STERAPRED UNI-PAK 21 TAB) 10 MG (21) TBPK tablet    Sig: Take 6 tablets on day one, 5 on day two, 4 on day three, 3 on day four, 2 on day five, and 1 on day six. Take with food.    Dispense:  21 tablet    Refill:  0   cetirizine  (ZYRTEC ) 10 MG tablet    Sig: Take 1 tablet (10 mg total) by mouth daily.    Dispense:  90 tablet    Refill:  0   doxycycline  (VIBRA -TABS) 100 MG tablet    Sig: Take 1 tablet (100 mg total) by mouth 2 (two) times daily for 7 days.    Dispense:  14 tablet    Refill:  0    May substitute with capsules if cheaper price    Provided resources for mental health and medicaid application    Follow Up Instructions: I discussed the assessment and treatment plan with the patient. The patient was provided an opportunity to ask questions and all were answered. The patient agreed with the plan and demonstrated an understanding of the instructions.  A copy of instructions were sent to the patient via MyChart unless otherwise noted below.    The patient was advised to call back or seek an in-person evaluation if the symptoms worsen or if the condition fails to improve as anticipated.    Russell Shake, FNP

## 2024-06-15 ENCOUNTER — Telehealth: Payer: Self-pay | Admitting: Nurse Practitioner

## 2024-06-15 DIAGNOSIS — J301 Allergic rhinitis due to pollen: Secondary | ICD-10-CM

## 2024-06-15 DIAGNOSIS — J4541 Moderate persistent asthma with (acute) exacerbation: Secondary | ICD-10-CM

## 2024-06-15 MED ORDER — PREDNISONE 20 MG PO TABS: 40.0000 mg | ORAL_TABLET | Freq: Every day | ORAL | 0 refills | Status: DC

## 2024-06-15 MED ORDER — CETIRIZINE HCL 10 MG PO TABS: 10.0000 mg | ORAL_TABLET | Freq: Every day | ORAL | 1 refills | Status: AC

## 2024-06-15 MED ORDER — ALBUTEROL SULFATE HFA 108 (90 BASE) MCG/ACT IN AERS: 1.0000 | INHALATION_SPRAY | Freq: Four times a day (QID) | RESPIRATORY_TRACT | 0 refills | Status: DC | PRN

## 2024-06-15 NOTE — Patient Instructions (Signed)
 Russell [name], thank you for joining Haze LELON Servant, NP for today's virtual visit.  While this provider is not your primary care provider (PCP), if your PCP is located in our provider database this encounter information will be shared with them immediately following your visit.   A [facility] MyChart account gives you access to today's visit and all your visits, tests, and labs performed at [facility]  click here if you don't have a [facility] MyChart account or go to [email or web address]  Consent: (Patient) Russell [name] provided verbal consent for this virtual visit at the beginning of the encounter.  Current Medications:  Current Outpatient Medications:    albuterol  (VENTOLIN  HFA) 108 (90 Base) MCG/ACT inhaler, Inhale 1-2 puffs into the lungs every 6 (six) hours as needed., Disp: 18 g, Rfl: 0   benzonatate  (TESSALON ) 100 MG capsule, Take 1-2 caps PO TID PRN, Disp: 20 capsule, Rfl: 0   cetirizine  (ZYRTEC ) 10 MG tablet, Take 1 tablet (10 mg total) by mouth daily., Disp: 30 tablet, Rfl: 1   dicyclomine  (BENTYL ) 10 MG capsule, Take 1 capsule (10 mg total) by mouth 4 (four) times daily -  before meals and at bedtime., Disp: 40 capsule, Rfl: 0   fluticasone  (FLONASE ) 50 MCG/ACT nasal spray, Place 2 sprays into both nostrils daily., Disp: 16 g, Rfl: 0   mometasone -formoterol  (DULERA ) 200-5 MCG/ACT AERO, Inhale 2 puffs into the lungs 2 (two) times daily., Disp: 13 g, Rfl: 0   predniSONE  (DELTASONE ) 20 MG tablet, Take 2 tablets (40 mg total) by mouth daily with breakfast., Disp: 14 tablet, Rfl: 0   Medications ordered in this encounter:  Meds ordered this encounter  Medications   albuterol  (VENTOLIN  HFA) 108 (90 Base) MCG/ACT inhaler    Sig: Inhale 1-2 puffs into the lungs every 6 (six) hours as needed.    Dispense:  18 g    Refill:  0    Supervising Provider:   BLAISE ALEENE KIDD [id number]   predniSONE  (DELTASONE ) 20 MG tablet    Sig: Take 2 tablets (40 mg total) by  mouth daily with breakfast.    Dispense:  14 tablet    Refill:  0    Supervising Provider:   BLAISE ALEENE KIDD [[id number]]   cetirizine  (ZYRTEC ) 10 MG tablet    Sig: Take 1 tablet (10 mg total) by mouth daily.    Dispense:  30 tablet    Refill:  1    Supervising Provider:   LAMPTEY, PHILIP O [phone]     *If you need refills on other medications prior to your next appointment, please contact your pharmacy*  Follow-Up: Call back or seek an in-person evaluation if the symptoms worsen or if the condition fails to improve as anticipated.  [facility] [phone]     If you have been instructed to have an in-person evaluation today at a local Urgent Care facility, please use the link below. It will take you to a list of all of our available [facility], including address, phone number and hours of operation. Please do not delay care.  [facility]  If you or a family member do not have a primary care provider, use the link below to schedule a visit and establish care. When you choose a [facility] primary care physician or advanced practice provider, you gain a long-term partner in health. Find a Primary Care Provider  Learn more about [facility] in-office and virtual care  options: [facility] Get Care Now

## 2024-06-15 NOTE — Progress Notes (Signed)
 Virtual Visit Consent   [name], you are scheduled for a virtual visit with a [facility] provider today. Just as with appointments in the office, your consent must be obtained to participate. Your consent will be active for this visit and any virtual visit you may have with one of our providers in the next 365 days. If you have a MyChart account, a copy of this consent can be sent to you electronically.  As this is a virtual visit, video technology does not allow for your provider to perform a traditional examination. This may limit your provider's ability to fully assess your condition. If your provider identifies any concerns that need to be evaluated in person or the need to arrange testing (such as labs, EKG, etc.), we will make arrangements to do so. Although advances in technology are sophisticated, we cannot ensure that it will always work on either your end or our end. If the connection with a video visit is poor, the visit may have to be switched to a telephone visit. With either a video or telephone visit, we are not always able to ensure that we have a secure connection.  By engaging in this virtual visit, you consent to the provision of healthcare and authorize for your insurance to be billed (if applicable) for the services provided during this visit. Depending on your insurance coverage, you may receive a charge related to this service.  I need to obtain your verbal consent now. Are you willing to proceed with your visit today? Russell [name] has provided verbal consent on 06/15/2024 for a virtual visit (video or telephone). Haze LELON Servant, NP  Date: 06/15/2024 1:08 PM   Virtual Visit via Video Note   I, Haze LELON Servant, connected with  Russell [name]  ([id number], Nov 24, 1991) on 06/15/24 at  1:00 PM EDT by a video-enabled telemedicine application and verified that I am speaking with the correct person using two identifiers.  Location: Patient: Virtual Visit Location Patient:  Home Provider: Virtual Visit Location Provider: Home Office   I discussed the limitations of evaluation and management by telemedicine and the availability of in person appointments. The patient expressed understanding and agreed to proceed.    History of Present Illness: Russell [name] is a 33 y.o. who identifies as a male who was assigned male at birth, and is being seen today for asthma exacerbation.  Mr Halls has been experiencing Shortness of breath, sneezing, watery itchy eyes and wheezing. He is out of his antihistamine, SABA and will need prednisone  at this time. He currently does not have insurance so he is unable to see his PCP or afford his Dulera .    Problems:  Patient Active Problem List   Diagnosis Date Noted   Loud snoring 02/16/2023   SOB (shortness of breath) 02/16/2023   Excessive daytime sleepiness 02/16/2023   Asthma 01/13/2023   Left elbow pain 01/13/2023   Shoulder impingement, left 01/13/2023   Other fatigue 01/13/2023   Nausea and vomiting 01/13/2023   Weakness 01/13/2023   Sleep apnea 01/13/2023   Adjustment disorder with anxious mood 10/20/2017   Bipolar 1 disorder, depressed, moderate (HCC) 09/22/2017   Family dynamics problem 09/18/2017   Self-mutilation 09/18/2017   Work environment adverse effect 09/18/2017   Bereavement reaction 09/18/2017   Closed nondisplaced fracture of shaft of fourth metacarpal bone of right hand 04/04/2017   Right hand pain 04/04/2017   GAD (generalized anxiety disorder) 12/10/2015   Tinnitus 04/09/2015   Allergy  03/02/2015   BMI 36.0-36.9,adult 11/02/2014    Allergies:  Allergies  Allergen Reactions   Penicillins     Unknown   Medications:  Current Outpatient Medications:    albuterol  (VENTOLIN  HFA) 108 (90 Base) MCG/ACT inhaler, Inhale 1-2 puffs into the lungs every 6 (six) hours as needed., Disp: 18 g, Rfl: 0   benzonatate  (TESSALON ) 100 MG capsule, Take 1-2 caps PO TID PRN, Disp: 20 capsule, Rfl: 0   cetirizine   (ZYRTEC ) 10 MG tablet, Take 1 tablet (10 mg total) by mouth daily., Disp: 30 tablet, Rfl: 1   dicyclomine  (BENTYL ) 10 MG capsule, Take 1 capsule (10 mg total) by mouth 4 (four) times daily -  before meals and at bedtime., Disp: 40 capsule, Rfl: 0   fluticasone  (FLONASE ) 50 MCG/ACT nasal spray, Place 2 sprays into both nostrils daily., Disp: 16 g, Rfl: 0   mometasone -formoterol  (DULERA ) 200-5 MCG/ACT AERO, Inhale 2 puffs into the lungs 2 (two) times daily., Disp: 13 g, Rfl: 0   predniSONE  (DELTASONE ) 20 MG tablet, Take 2 tablets (40 mg total) by mouth daily with breakfast., Disp: 14 tablet, Rfl: 0  Observations/Objective: Patient is well-developed, well-nourished in no acute distress.  Resting comfortably at home.  Head is normocephalic, atraumatic.  No labored breathing.  Speech is clear and coherent with logical content.  Patient is alert and oriented at baseline.    Assessment and Plan: 1. Moderate persistent asthma with exacerbation - albuterol  (VENTOLIN  HFA) 108 (90 Base) MCG/ACT inhaler; Inhale 1-2 puffs into the lungs every 6 (six) hours as needed.  Dispense: 18 g; Refill: 0 - predniSONE  (DELTASONE ) 20 MG tablet; Take 2 tablets (40 mg total) by mouth daily with breakfast.  Dispense: 14 tablet; Refill: 0 - cetirizine  (ZYRTEC ) 10 MG tablet; Take 1 tablet (10 mg total) by mouth daily.  Dispense: 30 tablet; Refill: 1  2. Seasonal allergic rhinitis due to pollen - predniSONE  (DELTASONE ) 20 MG tablet; Take 2 tablets (40 mg total) by mouth daily with breakfast.  Dispense: 14 tablet; Refill: 0 - cetirizine  (ZYRTEC ) 10 MG tablet; Take 1 tablet (10 mg total) by mouth daily.  Dispense: 30 tablet; Refill: 1    Follow Up Instructions: I discussed the assessment and treatment plan with the patient. The patient was provided an opportunity to ask questions and all were answered. The patient agreed with the plan and demonstrated an understanding of the instructions.  A copy of instructions were sent  to the patient via MyChart unless otherwise noted below.    The patient was advised to call back or seek an in-person evaluation if the symptoms worsen or if the condition fails to improve as anticipated.    Lamon Rotundo W Hazley Dezeeuw, NP

## 2024-06-18 ENCOUNTER — Encounter (HOSPITAL_COMMUNITY): Payer: Self-pay

## 2024-06-18 ENCOUNTER — Ambulatory Visit (HOSPITAL_COMMUNITY)
Admission: EM | Admit: 2024-06-18 | Discharge: 2024-06-18 | Disposition: A | Discharge status: Home / Self Care | Payer: Self-pay | Source: Ambulatory Visit | Attending: Emergency Medicine | Admitting: Emergency Medicine

## 2024-06-18 DIAGNOSIS — L03011 Cellulitis of right finger: Secondary | ICD-10-CM

## 2024-06-18 DIAGNOSIS — S61212D Laceration without foreign body of right middle finger without damage to nail, subsequent encounter: Secondary | ICD-10-CM

## 2024-06-18 MED ORDER — CLINDAMYCIN HCL 300 MG PO CAPS: 300.0000 mg | ORAL_CAPSULE | Freq: Three times a day (TID) | ORAL | 0 refills | Status: DC

## 2024-06-18 NOTE — ED Triage Notes (Signed)
 Pt states had sutured placed to rt middle finger on Thursday at [facility]. States one suture is loose and they did a bad job. Two sutures were placed and redness noted. Denies drainage.

## 2024-06-18 NOTE — ED Provider Notes (Signed)
 [facility]    CSN: [id number] Arrival date & time: 06/18/24  1008      History   Chief Complaint Chief Complaint  Patient presents with   Laceration    HPI Russell [name] is a 33 y.o. male.   Patient presents today with a previous laceration to right middle finger last Thursday while at work.  He hit his hand on a door.  Went to [facility] had 2 sutures placed.  Patient is concerned that the area is red nail painful to touch and one of the sutures has popped open.  Patient denies any nausea vomiting diarrhea or current fever.    Past Medical History:  Diagnosis Date   Allergy    Anxiety    Asthma    Asthma 01/13/2023   Depression    Left elbow pain 01/13/2023   Elbow > shoulder Radiates to numbing sensation in pinky and ring finger. Not lifting heavy stuff   Obesity     Patient Active Problem List   Diagnosis Date Noted   Loud snoring 02/16/2023   SOB (shortness of breath) 02/16/2023   Excessive daytime sleepiness 02/16/2023   Asthma 01/13/2023   Left elbow pain 01/13/2023   Shoulder impingement, left 01/13/2023   Other fatigue 01/13/2023   Nausea and vomiting 01/13/2023   Weakness 01/13/2023   Sleep apnea 01/13/2023   Adjustment disorder with anxious mood 10/20/2017   Bipolar 1 disorder, depressed, moderate (HCC) 09/22/2017   Family dynamics problem 09/18/2017   Self-mutilation 09/18/2017   Work environment adverse effect 09/18/2017   Bereavement reaction 09/18/2017   Closed nondisplaced fracture of shaft of fourth metacarpal bone of right hand 04/04/2017   Right hand pain 04/04/2017   GAD (generalized anxiety disorder) 12/10/2015   Tinnitus 04/09/2015   Allergy 03/02/2015   BMI 36.0-36.9,adult 11/02/2014    Past Surgical History:  Procedure Laterality Date   TONSILLECTOMY     TYMPANOSTOMY TUBE PLACEMENT         Home Medications    Prior to Admission medications   Medication Sig Start Date End Date Taking? Authorizing  Provider  clindamycin (CLEOCIN) 300 MG capsule Take 1 capsule (300 mg total) by mouth 3 (three) times daily. 06/18/24  Yes Merilee Andrea CROME, NP  albuterol  (VENTOLIN  HFA) 108 (90 Base) MCG/ACT inhaler Inhale 1-2 puffs into the lungs every 6 (six) hours as needed. 06/15/24   Fleming, Zelda W, NP  cetirizine  (ZYRTEC ) 10 MG tablet Take 1 tablet (10 mg total) by mouth daily. 06/15/24   Fleming, Zelda W, NP  fluticasone  (FLONASE ) 50 MCG/ACT nasal spray Place 2 sprays into both nostrils daily. 03/29/24   Burnette, Jennifer M, PA-C  mometasone -formoterol  (DULERA ) 200-5 MCG/ACT AERO Inhale 2 puffs into the lungs 2 (two) times daily. 09/12/23   Gladis Elsie BROCKS, PA-C  famotidine  (PEPCID ) 20 MG tablet Take 1 tablet (20 mg total) by mouth 2 (two) times daily. 08/08/19 10/08/20  Christopher Savannah, PA-C  Fluticasone -Salmeterol (ADVAIR DISKUS) 250-50 MCG/DOSE AEPB Inhale 1 puff into the lungs 2 (two) times daily. 08/15/19 10/08/20  Blaise Aleene KIDD, MD  montelukast  (SINGULAIR ) 10 MG tablet Take 1 tablet (10 mg total) by mouth at bedtime. 03/20/20 10/08/20  Alvia Corean CROME, FNP    Family History Family History  Problem Relation Age of Onset   Hyperlipidemia Mother    Early death Mother    Hypertension Mother    Mental illness Mother    Alcohol abuse Father    Asthma Father  COPD Father    Drug abuse Father    Early death Father    Hypertension Father    Hyperlipidemia Father    Stroke Father    Heart attack Father    Mental retardation Maternal Uncle    Heart disease Maternal Grandfather     Social History Social History   Tobacco Use   Smoking status: Never   Smokeless tobacco: Current    Types: Chew  Vaping Use   Vaping status: Every Day  Substance Use Topics   Alcohol use: Yes    Comment: occasionally   Drug use: No     Allergies   Penicillins   Review of Systems Review of Systems  Constitutional:  Negative for fever.  Respiratory: Negative.    Cardiovascular: Negative.    Gastrointestinal: Negative.   Genitourinary: Negative.   Skin:        Redness tender to touch to right middle finger 2 sutures and placed 1 is not needed the second 1 has become knotted  Neurological: Negative.      Physical Exam Triage Vital Signs ED Triage Vitals  Encounter Vitals Group     BP 06/18/24 1024 132/86     Girls Systolic BP Percentile --      Girls Diastolic BP Percentile --      Boys Systolic BP Percentile --      Boys Diastolic BP Percentile --      Pulse Rate 06/18/24 1024 81     Resp 06/18/24 1024 18     Temp 06/18/24 1024 98 F (36.7 C)     Temp Source 06/18/24 1024 Oral     SpO2 06/18/24 1024 98 %     Weight --      Height --      Head Circumference --      Peak Flow --      Pain Score 06/18/24 1026 0     Pain Loc --      Pain Education --      Exclude from Growth Chart --    No data found.  Updated Vital Signs BP 132/86 (BP Location: Right Arm)   Pulse 81   Temp 98 F (36.7 C) (Oral)   Resp 18   SpO2 98%   Visual Acuity Right Eye Distance:   Left Eye Distance:   Bilateral Distance:    Right Eye Near:   Left Eye Near:    Bilateral Near:     Physical Exam Constitutional:      Appearance: Normal appearance.   Cardiovascular:     Rate and Rhythm: Normal rate.     Pulses: Normal pulses.  Pulmonary:     Effort: Pulmonary effort is normal.  Abdominal:     General: Abdomen is flat.   Musculoskeletal:        General: Normal range of motion.   Skin:    Capillary Refill: Capillary refill takes less than 2 seconds.     Findings: Erythema present.     Comments: Dime size erythema noted to right middle finger near the knuckle area.  2 sutures in place 1 on knotted 1 knotted.  Edges are approximated together no drainage noted   Neurological:     General: No focal deficit present.     Mental Status: He is alert.      UC Treatments / Results  Labs (all labs ordered are listed, but only abnormal results are displayed) Labs Reviewed  - No data to display  EKG   Radiology No results found.  Procedures Procedures (including critical care time)  Medications Ordered in UC Medications - No data to display  Initial Impression / Assessment and Plan / UC Course  I have reviewed the triage vital signs and the nursing notes.  Pertinent labs & imaging results that were available during my care of the patient were reviewed by me and considered in my medical decision making (see chart for details).     Patient reports he had a tetanus shot completed at [facility] He will need to take full dose of antibiotics with food expressed to patient if he notices the erythema spreading to his hand he would need to be seen in the ER for IV antibiotics Patient was also states that he was instructed to not wash the area.  He will need to wash dry this area twice a day apply Neosporin and a bandage Patient will need to return in 1 week for suture removal and follow-up Final Clinical Impressions(s) / UC Diagnoses   Final diagnoses:  Cellulitis of finger of right hand  Laceration of right middle finger without damage to nail, foreign body presence unspecified, subsequent encounter     Discharge Instructions      He will need to take full dose of antibiotics with food expressed to patient if he notices the erythema spreading to his hand he would need to be seen in the ER for IV antibiotics Patient was also states that he was instructed to not wash the area.  He will need to wash dry this area twice a day apply Neosporin and a bandage Patient will need to return in 1 week for suture removal and follow-up     ED Prescriptions     Medication Sig Dispense Auth. Provider   clindamycin (CLEOCIN) 300 MG capsule Take 1 capsule (300 mg total) by mouth 3 (three) times daily. 30 capsule Merilee Andrea CROME, NP      PDMP not reviewed this encounter.   Merilee Andrea CROME, NP 06/18/24 1058

## 2024-06-18 NOTE — Discharge Instructions (Signed)
 He will need to take full dose of antibiotics with food expressed to patient if he notices the erythema spreading to his hand he would need to be seen in the ER for IV antibiotics Patient was also states that he was instructed to not wash the area.  He will need to wash dry this area twice a day apply Neosporin and a bandage Patient will need to return in 1 week for suture removal and follow-up

## 2024-06-20 ENCOUNTER — Telehealth (HOSPITAL_COMMUNITY): Payer: Self-pay | Admitting: *Deleted

## 2024-06-20 NOTE — Telephone Encounter (Signed)
 Pt advised and verbalized understanding, he states he can't come in today but he has made an appt for tomorrow. Advised if any sx again he needs to go to ED

## 2024-06-20 NOTE — Telephone Encounter (Signed)
 Clindamycin can cause a lot of stomach problems and is possible that he is having an adverse reaction to the medication, however, if he was/is experiencing chest pain I recommend he be evaluated immediately to ensure that there is not something else going on.  I recommend you come in for evaluation but if he is experiencing chest pain, lightheadedness, shortness of breath he should go to the ER.

## 2024-06-20 NOTE — Telephone Encounter (Signed)
 Pt states the last couple doses of him taking clindamycin he has got chest pain and could feel the pills going through him and burning. He did not take a dose this morning. He denies any trouble breathing but states chest pain was so bad for a couple hours that he thought he would need to go to ED. Pt states that the pain only last a couple hours then is gone.   Advised pt I would send message to provider but generally at UC patients would need to come back in to be checked again. He does state his finger looks 10x better than it did at the office visit.

## 2024-06-21 ENCOUNTER — Ambulatory Visit (HOSPITAL_COMMUNITY): Payer: Self-pay

## 2024-07-07 ENCOUNTER — Other Ambulatory Visit: Payer: Self-pay | Admitting: Nurse Practitioner

## 2024-07-07 DIAGNOSIS — J4541 Moderate persistent asthma with (acute) exacerbation: Secondary | ICD-10-CM

## 2024-07-23 ENCOUNTER — Telehealth: Payer: Self-pay | Admitting: Family Medicine

## 2024-07-23 DIAGNOSIS — J4541 Moderate persistent asthma with (acute) exacerbation: Secondary | ICD-10-CM

## 2024-07-23 NOTE — Progress Notes (Signed)
  Because you have been getting inhaler refills over the year with us , you need to be seen in person and to get on going refills by a PCP. If you can not see your PCP you can follow up at an urgent care this time, in person. We can not maintain asthma medication refills, this is something that must be monitored for your safety.     NOTE: There will be NO CHARGE for this E-Visit   If you are having a true medical emergency, please call 911.

## 2024-08-05 ENCOUNTER — Encounter (HOSPITAL_COMMUNITY): Payer: Self-pay | Admitting: Emergency Medicine

## 2024-08-05 ENCOUNTER — Ambulatory Visit (HOSPITAL_COMMUNITY)
Admission: EM | Admit: 2024-08-05 | Discharge: 2024-08-05 | Disposition: A | Discharge status: Home / Self Care | Payer: Self-pay | Attending: Internal Medicine | Admitting: Internal Medicine

## 2024-08-05 ENCOUNTER — Other Ambulatory Visit: Payer: Self-pay

## 2024-08-05 DIAGNOSIS — L239 Allergic contact dermatitis, unspecified cause: Secondary | ICD-10-CM

## 2024-08-05 DIAGNOSIS — R220 Localized swelling, mass and lump, head: Secondary | ICD-10-CM

## 2024-08-05 DIAGNOSIS — T6591XA Toxic effect of unspecified substance, accidental (unintentional), initial encounter: Secondary | ICD-10-CM

## 2024-08-05 DIAGNOSIS — J4541 Moderate persistent asthma with (acute) exacerbation: Secondary | ICD-10-CM

## 2024-08-05 MED ORDER — PREDNISONE 20 MG PO TABS: 40.0000 mg | ORAL_TABLET | Freq: Every day | ORAL | 0 refills | Status: AC

## 2024-08-05 MED ORDER — METHYLPREDNISOLONE SODIUM SUCC 125 MG IJ SOLR
INTRAMUSCULAR | Status: AC
  Filled 2024-08-05: qty 2

## 2024-08-05 MED ORDER — ALBUTEROL SULFATE HFA 108 (90 BASE) MCG/ACT IN AERS: 1.0000 | INHALATION_SPRAY | Freq: Four times a day (QID) | RESPIRATORY_TRACT | 2 refills | Status: DC | PRN

## 2024-08-05 MED ORDER — HYDROCORTISONE 2.5 % EX LOTN: TOPICAL_LOTION | Freq: Two times a day (BID) | CUTANEOUS | 0 refills | Status: DC

## 2024-08-05 MED ORDER — METHYLPREDNISOLONE SODIUM SUCC 125 MG IJ SOLR
125.0000 mg | Freq: Once | INTRAMUSCULAR | Status: AC
  Administered 2024-08-05: 125 mg via INTRAMUSCULAR

## 2024-08-05 MED ORDER — TRIAMCINOLONE ACETONIDE 0.1 % EX CREA: 1.0000 | TOPICAL_CREAM | Freq: Two times a day (BID) | CUTANEOUS | 0 refills | Status: DC

## 2024-08-05 NOTE — ED Provider Notes (Signed)
 [facility]    CSN: [id number] Arrival date & time: 08/05/24  1056      History   Chief Complaint No chief complaint on file.   HPI Russell [name] is a 33 y.o. male.   33 year old male who presents to urgent care with complaints of a diffuse rash on his arms legs and face as well as some swelling around his eye and lips.  He reports that this started over the weekend and has worsened.  He does not know what he has come in contact with that could have potentially caused this.  He denies any insect stings or working in the yard.  He does report that last week he did spray his backyard with a insect repellent and on Friday he was carrying a new type of sheet rock that contains a large amount of insulation.  He has never had a reaction like this in the past.  He denies any difficulty breathing.     Past Medical History:  Diagnosis Date   Allergy    Anxiety    Asthma    Asthma 01/13/2023   Depression    Left elbow pain 01/13/2023   Elbow > shoulder Radiates to numbing sensation in pinky and ring finger. Not lifting heavy stuff   Obesity     Patient Active Problem List   Diagnosis Date Noted   Loud snoring 02/16/2023   SOB (shortness of breath) 02/16/2023   Excessive daytime sleepiness 02/16/2023   Asthma 01/13/2023   Left elbow pain 01/13/2023   Shoulder impingement, left 01/13/2023   Other fatigue 01/13/2023   Nausea and vomiting 01/13/2023   Weakness 01/13/2023   Sleep apnea 01/13/2023   Adjustment disorder with anxious mood 10/20/2017   Bipolar 1 disorder, depressed, moderate (HCC) 09/22/2017   Family dynamics problem 09/18/2017   Self-mutilation 09/18/2017   Work environment adverse effect 09/18/2017   Bereavement reaction 09/18/2017   Closed nondisplaced fracture of shaft of fourth metacarpal bone of right hand 04/04/2017   Right hand pain 04/04/2017   GAD (generalized anxiety disorder) 12/10/2015   Tinnitus 04/09/2015   Allergy 03/02/2015   BMI  36.0-36.9,adult 11/02/2014    Past Surgical History:  Procedure Laterality Date   TONSILLECTOMY     TYMPANOSTOMY TUBE PLACEMENT         Home Medications    Prior to Admission medications   Medication Sig Start Date End Date Taking? Authorizing Provider  hydrocortisone  2.5 % lotion Apply topically 2 (two) times daily. May apply to face and neck but do not apply close to the eyes 08/05/24  Yes Exie Chrismer A, PA-C  predniSONE  (DELTASONE ) 20 MG tablet Take 2 tablets (40 mg total) by mouth daily with breakfast for 5 days. 08/05/24 08/10/24 Yes Lige Lakeman A, PA-C  triamcinolone  cream (KENALOG ) 0.1 % Apply 1 Application topically 2 (two) times daily. Do not apply this to the face or neck 08/05/24  Yes Verleen Stuckey A, PA-C  albuterol  (VENTOLIN  HFA) 108 (90 Base) MCG/ACT inhaler Inhale 1-2 puffs into the lungs every 6 (six) hours as needed. 08/05/24   Teresa Almarie LABOR, PA-C  cetirizine  (ZYRTEC ) 10 MG tablet Take 1 tablet (10 mg total) by mouth daily. 06/15/24   Fleming, Zelda W, NP  clindamycin  (CLEOCIN ) 300 MG capsule Take 1 capsule (300 mg total) by mouth 3 (three) times daily. 06/18/24   Merilee Andrea CROME, NP  fluticasone  (FLONASE ) 50 MCG/ACT nasal spray Place 2 sprays into both nostrils daily. 03/29/24  Vivienne Nest M, PA-C  mometasone -formoterol  (DULERA ) 200-5 MCG/ACT AERO Inhale 2 puffs into the lungs 2 (two) times daily. 09/12/23   Gladis Elsie BROCKS, PA-C  famotidine  (PEPCID ) 20 MG tablet Take 1 tablet (20 mg total) by mouth 2 (two) times daily. 08/08/19 10/08/20  Christopher Savannah, PA-C  Fluticasone -Salmeterol (ADVAIR DISKUS) 250-50 MCG/DOSE AEPB Inhale 1 puff into the lungs 2 (two) times daily. 08/15/19 10/08/20  Blaise Aleene KIDD, MD  montelukast  (SINGULAIR ) 10 MG tablet Take 1 tablet (10 mg total) by mouth at bedtime. 03/20/20 10/08/20  Alvia Corean CROME, FNP    Family History Family History  Problem Relation Age of Onset   Hyperlipidemia Mother    Early death Mother     Hypertension Mother    Mental illness Mother    Alcohol abuse Father    Asthma Father    COPD Father    Drug abuse Father    Early death Father    Hypertension Father    Hyperlipidemia Father    Stroke Father    Heart attack Father    Mental retardation Maternal Uncle    Heart disease Maternal Grandfather     Social History Social History   Tobacco Use   Smoking status: Never   Smokeless tobacco: Current    Types: Chew  Vaping Use   Vaping status: Some Days  Substance Use Topics   Alcohol use: Yes    Comment: occasionally   Drug use: No     Allergies   Penicillins   Review of Systems Review of Systems  Constitutional:  Negative for chills and fever.  HENT:  Positive for facial swelling. Negative for ear pain and sore throat.   Eyes:  Negative for pain and visual disturbance.  Respiratory:  Negative for cough and shortness of breath.   Cardiovascular:  Negative for chest pain and palpitations.  Gastrointestinal:  Negative for abdominal pain and vomiting.  Genitourinary:  Negative for dysuria and hematuria.  Musculoskeletal:  Negative for arthralgias and back pain.  Skin:  Positive for rash. Negative for color change.  Neurological:  Negative for seizures and syncope.  All other systems reviewed and are negative.    Physical Exam Triage Vital Signs ED Triage Vitals  Encounter Vitals Group     BP 08/05/24 1223 (!) 140/89     Girls Systolic BP Percentile --      Girls Diastolic BP Percentile --      Boys Systolic BP Percentile --      Boys Diastolic BP Percentile --      Pulse Rate 08/05/24 1223 75     Resp 08/05/24 1223 (!) 22     Temp 08/05/24 1223 98 F (36.7 C)     Temp Source 08/05/24 1223 Oral     SpO2 08/05/24 1223 100 %     Weight --      Height --      Head Circumference --      Peak Flow --      Pain Score 08/05/24 1221 7     Pain Loc --      Pain Education --      Exclude from Growth Chart --    No data found.  Updated Vital  Signs BP (!) 140/89 (BP Location: Right Arm) Comment (BP Location): large cuff  Pulse 75   Temp 98 F (36.7 C) (Oral)   Resp (!) 22   SpO2 100%   Visual Acuity Right Eye Distance:   Left Eye  Distance:   Bilateral Distance:    Right Eye Near:   Left Eye Near:    Bilateral Near:     Physical Exam Vitals and nursing note reviewed.  Constitutional:      General: He is not in acute distress.    Appearance: He is well-developed.  HENT:     Head: Normocephalic and atraumatic.      Mouth/Throat:     Pharynx: Oropharynx is clear. No pharyngeal swelling.     Comments: No lip or tongue swelling.  Eyes:     Conjunctiva/sclera: Conjunctivae normal.  Cardiovascular:     Rate and Rhythm: Normal rate and regular rhythm.     Heart sounds: No murmur heard. Pulmonary:     Effort: Pulmonary effort is normal. No respiratory distress.     Breath sounds: Normal breath sounds.  Abdominal:     Palpations: Abdomen is soft.     Tenderness: There is no abdominal tenderness.  Musculoskeletal:        General: No swelling.     Cervical back: Neck supple.  Skin:    General: Skin is warm and dry.     Capillary Refill: Capillary refill takes less than 2 seconds.     Findings: Rash present. Rash is macular and urticarial.     Comments: Rash that is flat and consistent with urticarial reaction on the arms up to the mid upper arms, legs up to mid thigh, neck and face. There is minimal reaction on the chest and back. No signs of secondary infection  Neurological:     Mental Status: He is alert.  Psychiatric:        Mood and Affect: Mood normal.      UC Treatments / Results  Labs (all labs ordered are listed, but only abnormal results are displayed) Labs Reviewed - No data to display  EKG   Radiology No results found.  Procedures Procedures (including critical care time)  Medications Ordered in UC Medications  methylPREDNISolone  sodium succinate (SOLU-MEDROL ) 125 mg/2 mL injection 125  mg (125 mg Intramuscular Given 08/05/24 1320)    Initial Impression / Assessment and Plan / UC Course  I have reviewed the triage vital signs and the nursing notes.  Pertinent labs & imaging results that were available during my care of the patient were reviewed by me and considered in my medical decision making (see chart for details).     Swelling of face  Allergic reaction to chemical substance, accidental or unintentional, initial encounter  Allergic dermatitis  Moderate persistent asthma with exacerbation - Plan: albuterol  (VENTOLIN  HFA) 108 (90 Base) MCG/ACT inhaler   Symptoms and physical exam findings are consistent with an allergic reaction likely due to contact with a substance. Unsure want to original source was but the insect spray or the dry wall is possible. We will treat for a contact allergic reaction. We will treat with the following:  Medrol  injection given today. This is a steroid to help with swelling and allergic reaction.  Start 08/06/24 Prednisone  40 mg (2 tablets) once daily for 5 days. Take this in the morning.  This is a steroid to help with inflammation and pain. Triamcinolone  cream twice daily to the affected area as needed for itching/rash.  Do not apply this to the neck or face. Hydrocortisone  cream twice daily to the face and neck as needed for itching/rash.  Make sure to stay hydrated by drinking plenty of water. If you develop worsening facial swelling, difficulty breathing, tongue swelling  or throat swelling, then go to the emergency room immediately.  Return to urgent care or PCP as needed.   Refill of albuterol  inhaler called in today  Final Clinical Impressions(s) / UC Diagnoses   Final diagnoses:  Swelling of face  Allergic reaction to chemical substance, accidental or unintentional, initial encounter  Allergic dermatitis     Discharge Instructions      Symptoms and physical exam findings are consistent with an allergic reaction likely due  to contact with a substance. Unsure want to original source was but the insect spray or the dry wall is possible. We will treat for a contact allergic reaction. We will treat with the following:  Medrol  injection given today. This is a steroid to help with swelling and allergic reaction.  Start 08/06/24 Prednisone  40 mg (2 tablets) once daily for 5 days. Take this in the morning.  This is a steroid to help with inflammation and pain. Triamcinolone  cream twice daily to the affected area as needed for itching/rash.  Do not apply this to the neck or face. Hydrocortisone  cream twice daily to the face and neck as needed for itching/rash.  Make sure to stay hydrated by drinking plenty of water. If you develop worsening facial swelling, difficulty breathing, tongue swelling or throat swelling, then go to the emergency room immediately.  Return to urgent care or PCP as needed.   Refill of albuterol  inhaler called in today    ED Prescriptions     Medication Sig Dispense Auth. Provider   albuterol  (VENTOLIN  HFA) 108 (90 Base) MCG/ACT inhaler Inhale 1-2 puffs into the lungs every 6 (six) hours as needed. 18 g Gavyn Zoss A, PA-C   predniSONE  (DELTASONE ) 20 MG tablet Take 2 tablets (40 mg total) by mouth daily with breakfast for 5 days. 10 tablet Teresa Norris A, PA-C   triamcinolone  cream (KENALOG ) 0.1 % Apply 1 Application topically 2 (two) times daily. Do not apply this to the face or neck 464 g Daril Warga A, PA-C   hydrocortisone  2.5 % lotion Apply topically 2 (two) times daily. May apply to face and neck but do not apply close to the eyes 59 mL Inigo Lantigua, Norris LABOR, PA-C      PDMP not reviewed this encounter.   Teresa Norris LABOR, [place] 08/05/24 1356

## 2024-08-05 NOTE — ED Notes (Signed)
 Confirmed with Provider Pt is waiting for DC.

## 2024-08-05 NOTE — ED Triage Notes (Signed)
 Complains of hives, swelling around eyes, particularly left eye. Symptoms started Saturday.  Patient has itching as well.  Patient has taken benadryl  for symptoms.    Patient reports he has noticed lips itching and possibly swelling.  Denies breathing or swallowing issues.

## 2024-08-05 NOTE — ED Notes (Signed)
 PT has redness on face under Lt eye. Pt speaking in full sentences . No respiratory distress noted.

## 2024-08-05 NOTE — Discharge Instructions (Addendum)
 Symptoms and physical exam findings are consistent with an allergic reaction likely due to contact with a substance. Unsure want to original source was but the insect spray or the dry wall is possible. We will treat for a contact allergic reaction. We will treat with the following:  Medrol  injection given today. This is a steroid to help with swelling and allergic reaction.  Start 08/06/24 Prednisone  40 mg (2 tablets) once daily for 5 days. Take this in the morning.  This is a steroid to help with inflammation and pain. Triamcinolone  cream twice daily to the affected area as needed for itching/rash.  Do not apply this to the neck or face. Hydrocortisone  cream twice daily to the face and neck as needed for itching/rash.  Make sure to stay hydrated by drinking plenty of water. If you develop worsening facial swelling, difficulty breathing, tongue swelling or throat swelling, then go to the emergency room immediately.  Return to urgent care or PCP as needed.   Refill of albuterol  inhaler called in today

## 2024-09-02 ENCOUNTER — Encounter (HOSPITAL_COMMUNITY): Payer: Self-pay

## 2024-09-02 ENCOUNTER — Ambulatory Visit (HOSPITAL_COMMUNITY)
Admission: EM | Admit: 2024-09-02 | Discharge: 2024-09-02 | Disposition: A | Discharge status: Home / Self Care | Payer: Self-pay | Attending: Emergency Medicine | Admitting: Emergency Medicine

## 2024-09-02 DIAGNOSIS — L0291 Cutaneous abscess, unspecified: Secondary | ICD-10-CM

## 2024-09-02 DIAGNOSIS — R1909 Other intra-abdominal and pelvic swelling, mass and lump: Secondary | ICD-10-CM

## 2024-09-02 MED ORDER — DOXYCYCLINE HYCLATE 100 MG PO CAPS: 100.0000 mg | ORAL_CAPSULE | Freq: Two times a day (BID) | ORAL | 0 refills | Status: AC

## 2024-09-02 NOTE — ED Provider Notes (Signed)
 [facility]    CSN: [id number] Arrival date & time: 09/02/24  1903      History   Chief Complaint Chief Complaint  Patient presents with   Groin Pain    HPI Russell [name] is a 33 y.o. male.  Presents with intermittent swelling and pain in his right groin area.  This has been occurring for over a week now.  He has noticed occasional redness in the area has felt hot to touch. Has been using ibuprofen  and Tylenol   Denies any pain or swelling of the testicles.  Not having any abdominal pain, nausea, vomiting, diarrhea.  He denies fever or chills No urinary symptoms. No penile discharge. Monogamous with wife, not concerned for STD.  No history of this   Does report viral URI recently  Overall he feels symptoms are improving gradually  Past Medical History:  Diagnosis Date   Allergy    Anxiety    Asthma    Asthma 01/13/2023   Depression    Left elbow pain 01/13/2023   Elbow > shoulder Radiates to numbing sensation in pinky and ring finger. Not lifting heavy stuff   Obesity     Patient Active Problem List   Diagnosis Date Noted   Loud snoring 02/16/2023   SOB (shortness of breath) 02/16/2023   Excessive daytime sleepiness 02/16/2023   Asthma 01/13/2023   Left elbow pain 01/13/2023   Shoulder impingement, left 01/13/2023   Other fatigue 01/13/2023   Nausea and vomiting 01/13/2023   Weakness 01/13/2023   Sleep apnea 01/13/2023   Adjustment disorder with anxious mood 10/20/2017   Bipolar 1 disorder, depressed, moderate (HCC) 09/22/2017   Family dynamics problem 09/18/2017   Self-mutilation 09/18/2017   Work environment adverse effect 09/18/2017   Bereavement reaction 09/18/2017   Closed nondisplaced fracture of shaft of fourth metacarpal bone of right hand 04/04/2017   Right hand pain 04/04/2017   GAD (generalized anxiety disorder) 12/10/2015   Tinnitus 04/09/2015   Allergy 03/02/2015   BMI 36.0-36.9,adult 11/02/2014    Past Surgical History:   Procedure Laterality Date   TONSILLECTOMY     TYMPANOSTOMY TUBE PLACEMENT      Home Medications    Prior to Admission medications   Medication Sig Start Date End Date Taking? Authorizing Provider  doxycycline  (VIBRAMYCIN ) 100 MG capsule Take 1 capsule (100 mg total) by mouth 2 (two) times daily for 7 days. 09/02/24 09/09/24 Yes Alix Lahmann, [place], PA-C  albuterol  (VENTOLIN  HFA) 108 (90 Base) MCG/ACT inhaler Inhale 1-2 puffs into the lungs every 6 (six) hours as needed. 08/05/24   White, Elizabeth A, PA-C  cetirizine  (ZYRTEC ) 10 MG tablet Take 1 tablet (10 mg total) by mouth daily. 06/15/24   Fleming, Zelda W, NP  fluticasone  (FLONASE ) 50 MCG/ACT nasal spray Place 2 sprays into both nostrils daily. 03/29/24   Burnette, Jennifer M, PA-C  mometasone -formoterol  (DULERA ) 200-5 MCG/ACT AERO Inhale 2 puffs into the lungs 2 (two) times daily. 09/12/23   Gladis Elsie BROCKS, PA-C  famotidine  (PEPCID ) 20 MG tablet Take 1 tablet (20 mg total) by mouth 2 (two) times daily. 08/08/19 10/08/20  Christopher Savannah, PA-C  Fluticasone -Salmeterol (ADVAIR DISKUS) 250-50 MCG/DOSE AEPB Inhale 1 puff into the lungs 2 (two) times daily. 08/15/19 10/08/20  Blaise Aleene KIDD, MD  montelukast  (SINGULAIR ) 10 MG tablet Take 1 tablet (10 mg total) by mouth at bedtime. 03/20/20 10/08/20  Alvia Corean CROME, FNP    Family History Family History  Problem Relation Age of Onset  Hyperlipidemia Mother    Early death Mother    Hypertension Mother    Mental illness Mother    Alcohol abuse Father    Asthma Father    COPD Father    Drug abuse Father    Early death Father    Hypertension Father    Hyperlipidemia Father    Stroke Father    Heart attack Father    Mental retardation Maternal Uncle    Heart disease Maternal Grandfather     Social History Social History   Tobacco Use   Smoking status: Never   Smokeless tobacco: Current    Types: Chew  Vaping Use   Vaping status: Some Days  Substance Use Topics   Alcohol use: Yes     Comment: occasionally   Drug use: No     Allergies   Penicillins   Review of Systems Review of Systems As per HPI  Physical Exam Triage Vital Signs ED Triage Vitals  Encounter Vitals Group     BP 09/02/24 1950 (!) 129/90     Girls Systolic BP Percentile --      Girls Diastolic BP Percentile --      Boys Systolic BP Percentile --      Boys Diastolic BP Percentile --      Pulse Rate 09/02/24 1950 90     Resp 09/02/24 1950 16     Temp 09/02/24 1950 99.1 F (37.3 C)     Temp Source 09/02/24 1950 Oral     SpO2 09/02/24 1950 96 %     Weight --      Height --      Head Circumference --      Peak Flow --      Pain Score 09/02/24 1951 10     Pain Loc --      Pain Education --      Exclude from Growth Chart --    No data found.  Updated Vital Signs BP (!) 129/90 (BP Location: Right Arm)   Pulse 90   Temp 99.1 F (37.3 C) (Oral)   Resp 16   SpO2 96%   Physical Exam Vitals and nursing note reviewed. Exam conducted with a chaperone present [facility] CMA).  Constitutional:      General: He is not in acute distress.    Appearance: Normal appearance.  HENT:     Mouth/Throat:     Pharynx: Oropharynx is clear.  Cardiovascular:     Rate and Rhythm: Normal rate and regular rhythm.     Pulses: Normal pulses.     Heart sounds: Normal heart sounds.  Pulmonary:     Effort: Pulmonary effort is normal.     Breath sounds: Normal breath sounds.  Abdominal:     Palpations: Abdomen is soft.     Hernia: There is no hernia in the left inguinal area or right inguinal area.  Genitourinary:    Testes: Normal.     Epididymis:     Right: Normal.       Comments: Large somewhat firm mass in the right groin. Golf-ball sized. Tender. There is another smaller area of induration in the inner thigh area. This is non tender at this time, but patient says it was hurting earlier.  Lymphadenopathy:     Lower Body: Right inguinal adenopathy present.  Neurological:     Mental Status: He is  alert and oriented to person, place, and time.     UC Treatments / Results  Labs (all  labs ordered are listed, but only abnormal results are displayed) Labs Reviewed - No data to display  EKG  Radiology No results found.  Procedures Procedures   Medications Ordered in UC Medications - No data to display  Initial Impression / Assessment and Plan / UC Course  I have reviewed the triage vital signs and the nursing notes.  Pertinent labs & imaging results that were available during my care of the patient were reviewed by me and considered in my medical decision making (see chart for details).  Consider reactive lymph node from recent viral illness, or possible abscess in the nearby thigh. I have discussed with patient. Recommend to treat for acute infection with doxycycline  BID x 7 days. Strict ED precautions are verbalized for any worsening or new symptoms (fever, chills, vomiting, weight loss, testicular symptoms, etc) Staff has set him up with a PCP for November. 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 Agrees to plan, all questions answered   Final Clinical Impressions(s) / UC Diagnoses   Final diagnoses:  Right groin mass  Abscess     Discharge Instructions      I am treating you for possible abscess in the groin area Please take the antibiotic as directed. Twice daily for 7 days. Take with food to avoid upset stomach. Finish all the pills.   Please go to the emergency department if symptoms worsen or become severe.   I recommend seeing if you qualify for medicaid.  [email or web address]  Please follow up with your new primary care provider regarding symptoms and further management.      ED Prescriptions     Medication Sig Dispense Auth. Provider   doxycycline  (VIBRAMYCIN ) 100 MG capsule Take 1 capsule (100 mg total) by mouth 2 (two) times daily for 7 days. 14 capsule Derold Dorsch, Asberry, PA-C      PDMP not reviewed this encounter.    Diyana Starrett, Asberry RIGGERS 09/02/24 2026

## 2024-09-02 NOTE — Discharge Instructions (Addendum)
 I am treating you for possible abscess in the groin area Please take the antibiotic as directed. Twice daily for 7 days. Take with food to avoid upset stomach. Finish all the pills.   Please go to the emergency department if symptoms worsen or become severe.   I recommend seeing if you qualify for medicaid.  [email or web address]  Please follow up with your new primary care provider regarding symptoms and further management.

## 2024-09-02 NOTE — ED Triage Notes (Signed)
 Patient here today with c/o right side groin pain and swelling X 9 days. He notices some redness off and on and heat to the touch. He has been taking Tylenol  and Ibuprofen  with some relief.

## 2024-09-26 ENCOUNTER — Encounter (HOSPITAL_COMMUNITY): Payer: Self-pay | Admitting: *Deleted

## 2024-09-26 ENCOUNTER — Emergency Department (HOSPITAL_COMMUNITY): Payer: Self-pay

## 2024-09-26 ENCOUNTER — Emergency Department (HOSPITAL_COMMUNITY)
Admission: EM | Admit: 2024-09-26 | Discharge: 2024-09-26 | Disposition: A | Discharge status: Home / Self Care | Payer: Self-pay | Attending: Emergency Medicine | Admitting: Emergency Medicine

## 2024-09-26 ENCOUNTER — Other Ambulatory Visit: Payer: Self-pay

## 2024-09-26 DIAGNOSIS — R591 Generalized enlarged lymph nodes: Secondary | ICD-10-CM | POA: Insufficient documentation

## 2024-09-26 DIAGNOSIS — J45909 Unspecified asthma, uncomplicated: Secondary | ICD-10-CM | POA: Insufficient documentation

## 2024-09-26 DIAGNOSIS — D72829 Elevated white blood cell count, unspecified: Secondary | ICD-10-CM | POA: Insufficient documentation

## 2024-09-26 LAB — CBC
HCT: 41.6 % (ref 39.0–52.0)
Hemoglobin: 14.4 g/dL (ref 13.0–17.0)
MCH: 28 pg (ref 26.0–34.0)
MCHC: 34.6 g/dL (ref 30.0–36.0)
MCV: 80.8 fL (ref 80.0–100.0)
Platelets: 308 K/uL (ref 150–400)
RBC: 5.15 MIL/uL (ref 4.22–5.81)
RDW: 11.9 % (ref 11.5–15.5)
WBC: 12.5 K/uL — ABNORMAL HIGH (ref 4.0–10.5)
nRBC: 0 % (ref 0.0–0.2)

## 2024-09-26 LAB — BASIC METABOLIC PANEL WITH GFR
Anion gap: 12 (ref 5–15)
BUN: 11 mg/dL (ref 6–20)
CO2: 22 mmol/L (ref 22–32)
Calcium: 8.9 mg/dL (ref 8.9–10.3)
Chloride: 103 mmol/L (ref 98–111)
Creatinine, Ser: 0.96 mg/dL (ref 0.61–1.24)
GFR, Estimated: >60 mL/min (ref >60)
Glucose, Bld: 100 mg/dL — ABNORMAL HIGH (ref 70–99)
Potassium: 3.6 mmol/L (ref 3.5–5.1)
Sodium: 137 mmol/L (ref 135–145)

## 2024-09-26 MED ORDER — KETOROLAC TROMETHAMINE 15 MG/ML IJ SOLN
15.0000 mg | Freq: Once | INTRAMUSCULAR | Status: AC
  Administered 2024-09-26: 15 mg via INTRAMUSCULAR
  Filled 2024-09-26: qty 1

## 2024-09-26 MED ORDER — ONDANSETRON 4 MG PO TBDP: 4.0000 mg | ORAL_TABLET | Freq: Three times a day (TID) | ORAL | 0 refills | Status: AC | PRN

## 2024-09-26 MED ORDER — DOXYCYCLINE HYCLATE 100 MG PO CAPS: 100.0000 mg | ORAL_CAPSULE | Freq: Two times a day (BID) | ORAL | 0 refills | Status: DC

## 2024-09-26 MED ORDER — OXYCODONE HCL 5 MG PO TABS: 5.0000 mg | ORAL_TABLET | Freq: Four times a day (QID) | ORAL | 0 refills | Status: AC | PRN

## 2024-09-26 MED ORDER — IBUPROFEN 400 MG PO TABS
600.0000 mg | ORAL_TABLET | Freq: Once | ORAL | Status: AC
  Administered 2024-09-26: 600 mg via ORAL
  Filled 2024-09-26: qty 1

## 2024-09-26 MED ORDER — IOHEXOL 350 MG/ML SOLN
100.0000 mL | Freq: Once | INTRAVENOUS | Status: AC | PRN
  Administered 2024-09-26: 100 mL via INTRAVENOUS

## 2024-09-26 NOTE — ED Triage Notes (Signed)
 Pt was seen for pain and swelling in right groin at [facility] and placed on antibiotic about 3 weeks ago.  Pt was told that if this did not get better he needed to return for evaluation.  Pt states that this lump is unchanged, it is not draining.  Pt reports that he has been having fever or chills.

## 2024-09-26 NOTE — Discharge Instructions (Addendum)
 We evaluated you for your swollen lymph nodes.  Your CT scan shows additional swollen lymph nodes in your abdomen.  We do not know the exact cause of your symptoms.  This could be due to an infection since it improved with antibiotics.  It is also possible it could be a type of tumor such as lymphoma.  We would recommend following up with an oncologist.  You may need a lymph node biopsy.  Please take Tylenol  (acetaminophen ) and Motrin  (ibuprofen ) for your symptoms at home.  You can take 1000 mg of Tylenol  every 6 hours and 600 mg of Motrin  every 6 hours as needed for your symptoms.  You can take these medicines together as needed, either at the same time, or alternating every 3 hours.  We have given you a small amount of oxycodone which you can take for pain not relieved by Tylenol  and Motrin .  Do not drink alcohol, drive, or operate machinery when taking this medicine.   Please return if you develop any new symptoms such as swelling or redness to the leg, increasing pain or uncontrolled pain, or any other new symptoms.

## 2024-09-26 NOTE — ED Provider Notes (Signed)
 [place] [facility]MENT AT [facility] Provider Note  CSN: [id number] Arrival date & time: 09/26/24 9040  Chief Complaint(s) Groin Pain  HPI Russell [name] is a 33 y.o. male without relevant past medical history presenting to the emergency department groin swelling.  Patient reports swelling to the right inguinal region, initially went to urgent care last month, was treated with doxycycline  and reported improvement however recurred.  Denies any swelling to the leg, recent travel, surgeries, history of similar symptoms, testicular pain, scrotal pain, wound.  Does report some subjective fevers and chills at home.   Past Medical History Past Medical History:  Diagnosis Date   Allergy    Anxiety    Asthma    Asthma 01/13/2023   Depression    Left elbow pain 01/13/2023   Elbow > shoulder Radiates to numbing sensation in pinky and ring finger. Not lifting heavy stuff   Obesity    Patient Active Problem List   Diagnosis Date Noted   Loud snoring 02/16/2023   SOB (shortness of breath) 02/16/2023   Excessive daytime sleepiness 02/16/2023   Asthma 01/13/2023   Left elbow pain 01/13/2023   Shoulder impingement, left 01/13/2023   Other fatigue 01/13/2023   Nausea and vomiting 01/13/2023   Weakness 01/13/2023   Sleep apnea 01/13/2023   Adjustment disorder with anxious mood 10/20/2017   Bipolar 1 disorder, depressed, moderate (HCC) 09/22/2017   Family dynamics problem 09/18/2017   Self-mutilation 09/18/2017   Work environment adverse effect 09/18/2017   Bereavement reaction 09/18/2017   Closed nondisplaced fracture of shaft of fourth metacarpal bone of right hand 04/04/2017   Right hand pain 04/04/2017   GAD (generalized anxiety disorder) 12/10/2015   Tinnitus 04/09/2015   Allergy 03/02/2015   BMI 36.0-36.9,adult 11/02/2014   Home Medication(s) Prior to Admission medications   Medication Sig Start Date End Date Taking? Authorizing Provider  doxycycline   (VIBRAMYCIN ) 100 MG capsule Take 1 capsule (100 mg total) by mouth 2 (two) times daily. 09/26/24  Yes Francesca Elsie CROME, MD  ondansetron  (ZOFRAN -ODT) 4 MG disintegrating tablet Take 1 tablet (4 mg total) by mouth every 8 (eight) hours as needed for nausea or vomiting. 09/26/24  Yes Francesca Elsie CROME, MD  oxyCODONE (ROXICODONE) 5 MG immediate release tablet Take 1 tablet (5 mg total) by mouth every 6 (six) hours as needed for severe pain (pain score 7-10). 09/26/24  Yes Francesca Elsie CROME, MD  albuterol  (VENTOLIN  HFA) 108 (90 Base) MCG/ACT inhaler Inhale 1-2 puffs into the lungs every 6 (six) hours as needed. 08/05/24   White, Elizabeth A, PA-C  cetirizine  (ZYRTEC ) 10 MG tablet Take 1 tablet (10 mg total) by mouth daily. 06/15/24   Fleming, Zelda W, NP  fluticasone  (FLONASE ) 50 MCG/ACT nasal spray Place 2 sprays into both nostrils daily. 03/29/24   Burnette, Jennifer M, PA-C  mometasone -formoterol  (DULERA ) 200-5 MCG/ACT AERO Inhale 2 puffs into the lungs 2 (two) times daily. 09/12/23   Gladis Elsie BROCKS, PA-C  famotidine  (PEPCID ) 20 MG tablet Take 1 tablet (20 mg total) by mouth 2 (two) times daily. 08/08/19 10/08/20  Christopher Savannah, PA-C  Fluticasone -Salmeterol (ADVAIR DISKUS) 250-50 MCG/DOSE AEPB Inhale 1 puff into the lungs 2 (two) times daily. 08/15/19 10/08/20  Blaise Aleene KIDD, MD  montelukast  (SINGULAIR ) 10 MG tablet Take 1 tablet (10 mg total) by mouth at bedtime. 03/20/20 10/08/20  Alvia Corean CROME, FNP  Past Surgical History Past Surgical History:  Procedure Laterality Date   TONSILLECTOMY     TYMPANOSTOMY TUBE PLACEMENT     Family History Family History  Problem Relation Age of Onset   Hyperlipidemia Mother    Early death Mother    Hypertension Mother    Mental illness Mother    Alcohol abuse Father    Asthma Father    COPD Father    Drug abuse Father     Early death Father    Hypertension Father    Hyperlipidemia Father    Stroke Father    Heart attack Father    Mental retardation Maternal Uncle    Heart disease Maternal Grandfather     Social History Social History   Tobacco Use   Smoking status: Never   Smokeless tobacco: Current    Types: Chew  Vaping Use   Vaping status: Some Days  Substance Use Topics   Alcohol use: Yes    Comment: occasionally   Drug use: No   Allergies Penicillins  Review of Systems Review of Systems  All other systems reviewed and are negative.   Physical Exam Vital Signs  I have reviewed the triage vital signs BP 123/80   Pulse 100   Temp 98.2 F (36.8 C)   Resp (!) 21   SpO2 99%  Physical Exam Vitals and nursing note reviewed.  Constitutional:      General: He is not in acute distress.    Appearance: Normal appearance.  HENT:     Mouth/Throat:     Mouth: Mucous membranes are moist.  Eyes:     Conjunctiva/sclera: Conjunctivae normal.  Cardiovascular:     Rate and Rhythm: Normal rate and regular rhythm.  Pulmonary:     Effort: Pulmonary effort is normal. No respiratory distress.     Breath sounds: Normal breath sounds.  Abdominal:     General: Abdomen is flat.     Palpations: Abdomen is soft.     Tenderness: There is no abdominal tenderness.  Genitourinary:    Comments: Chaperoned by RN, large right inguinal adenopathy, no testicular swelling or tenderness, otherwise normal external male genitalia Musculoskeletal:     Right lower leg: No edema.     Left lower leg: No edema.     Comments: No leg swelling or tenderness  Skin:    General: Skin is warm and dry.     Capillary Refill: Capillary refill takes less than 2 seconds.     Findings: No erythema.  Neurological:     Mental Status: He is alert and oriented to person, place, and time. Mental status is at baseline.  Psychiatric:        Mood and Affect: Mood normal.        Behavior: Behavior normal.     ED Results and  Treatments Labs (all labs ordered are listed, but only abnormal results are displayed) Labs Reviewed  CBC - Abnormal; Notable for the following components:      Result Value   WBC 12.5 (*)    All other components within normal limits  BASIC METABOLIC PANEL WITH GFR - Abnormal; Notable for the following components:   Glucose, Bld 100 (*)    All other components within normal limits  DIFFERENTIAL  HIV ANTIBODY (ROUTINE TESTING W REFLEX)  Radiology CT ABDOMEN PELVIS W CONTRAST Result Date: 09/26/2024 CLINICAL DATA:  Groin swelling. EXAM: CT ABDOMEN AND PELVIS WITH CONTRAST TECHNIQUE: Multidetector CT imaging of the abdomen and pelvis was performed using the standard protocol following bolus administration of intravenous contrast. RADIATION DOSE REDUCTION: This exam was performed according to the departmental dose-optimization program which includes automated exposure control, adjustment of the mA and/or kV according to patient size and/or use of iterative reconstruction technique. CONTRAST:  100mL OMNIPAQUE IOHEXOL 350 MG/ML SOLN COMPARISON:  None Available. FINDINGS: Lower chest: No acute abnormality. Hepatobiliary: No focal liver abnormality is seen. No gallstones, gallbladder wall thickening, or biliary dilatation. Pancreas: Unremarkable. No pancreatic ductal dilatation or surrounding inflammatory changes. Spleen: Normal in size without focal abnormality. Adrenals/Urinary Tract: Adrenal glands are unremarkable. Kidneys are normal, without renal calculi, focal lesion, or hydronephrosis. Bladder is unremarkable. Stomach/Bowel: Stomach is within normal limits. Appendix appears normal. No evidence of bowel wall thickening, distention, or inflammatory changes. Vascular/Lymphatic: Extensive adenopathy is seen involving the right inguinal, iliac and periaortic regions, which may be  malignant or inflammatory in etiology. Tissue sampling is recommended. Largest lymph node measures 6.8 x 2.6 cm inferior right inguinal region. 3.0 x 2.4 cm right external iliac lymph node is noted. 4.3 x 1.4 cm right periaortic lymph node is noted. Reproductive: Prostate is unremarkable. Other: No ascites or hernia is noted. Musculoskeletal: No acute or significant osseous findings. IMPRESSION: Extensive adenopathy is seen involving the right inguinal, iliac and periaortic regions, which may be malignant or inflammatory in etiology. Tissue sampling is recommended. Electronically Signed   By: Lynwood Landy Raddle M.D.   On: 09/26/2024 15:15    Pertinent labs & imaging results that were available during my care of the patient were reviewed by me and considered in my medical decision making (see MDM for details).  Medications Ordered in ED Medications  ketorolac  (TORADOL ) 15 MG/ML injection 15 mg (has no administration in time range)  ibuprofen  (ADVIL ) tablet 600 mg (600 mg Oral Given 09/26/24 1532)  iohexol (OMNIPAQUE) 350 MG/ML injection 100 mL (100 mLs Intravenous Contrast Given 09/26/24 1455)                                                                                                                                     Procedures Procedures  (including critical care time)  Medical Decision Making / ED Course   MDM:  33 year old presented to the emergency department with groin swelling.  On exam, patient has right large inguinal adenopathy without overlying skin change.  Normal GU exam, normal right lower extremity exam.  No cellulitis.  Unclear cause, differential includes infectious, no clear trigger, but does report improvement with doxycycline .  Will give additional course.  CT scan was obtained which also shows intra-abdominal lymphadenopathy and repair aortic region, concerning for possible  malignant process.  Will refer to oncology for further care including possible biopsy.   Considered other process  such as DVT but no lower extremity swelling, erythema, warmth or tenderness.  No evidence of any GI cause.  Will send HIV test as well.  Discussed return precautions.      Additional history obtained:  -External records from outside source obtained and reviewed including: Chart review including previous notes, labs, imaging, consultation notes including UC note    Lab Tests: -I ordered, reviewed, and interpreted labs.   The pertinent results include:   Labs Reviewed  CBC - Abnormal; Notable for the following components:      Result Value   WBC 12.5 (*)    All other components within normal limits  BASIC METABOLIC PANEL WITH GFR - Abnormal; Notable for the following components:   Glucose, Bld 100 (*)    All other components within normal limits  DIFFERENTIAL  HIV ANTIBODY (ROUTINE TESTING W REFLEX)    Notable for mild leukocytosis      Imaging Studies ordered: I ordered imaging studies including CT abdomnen On my interpretation imaging demonstrates LAD I independently visualized and interpreted imaging. I agree with the radiologist interpretation   Medicines ordered and prescription drug management: Meds ordered this encounter  Medications   ibuprofen  (ADVIL ) tablet 600 mg   iohexol (OMNIPAQUE) 350 MG/ML injection 100 mL   ketorolac  (TORADOL ) 15 MG/ML injection 15 mg   doxycycline  (VIBRAMYCIN ) 100 MG capsule    Sig: Take 1 capsule (100 mg total) by mouth 2 (two) times daily.    Dispense:  20 capsule    Refill:  0   oxyCODONE (ROXICODONE) 5 MG immediate release tablet    Sig: Take 1 tablet (5 mg total) by mouth every 6 (six) hours as needed for severe pain (pain score 7-10).    Dispense:  10 tablet    Refill:  0   ondansetron  (ZOFRAN -ODT) 4 MG disintegrating tablet    Sig: Take 1 tablet (4 mg total) by mouth every 8 (eight) hours as needed for nausea or vomiting.    Dispense:  20 tablet    Refill:  0    -I have reviewed the patients home  medicines and have made adjustments as needed    Social Determinants of Health:  Diagnosis or treatment significantly limited by social determinants of health: obesity   Reevaluation: After the interventions noted above, I reevaluated the patient and found that their symptoms have improved  Co morbidities that complicate the patient evaluation  Past Medical History:  Diagnosis Date   Allergy    Anxiety    Asthma    Asthma 01/13/2023   Depression    Left elbow pain 01/13/2023   Elbow > shoulder Radiates to numbing sensation in pinky and ring finger. Not lifting heavy stuff   Obesity       Dispostion: Disposition decision including need for hospitalization was considered, and patient discharged from emergency department.    Final Clinical Impression(s) / ED Diagnoses Final diagnoses:  Lymphadenopathy     This chart was dictated using voice recognition software.  Despite best efforts to proofread,  errors can occur which can change the documentation meaning.    Francesca Elsie CROME, MD 09/26/24 [id number]

## 2024-09-26 NOTE — ED Provider Triage Note (Signed)
 Emergency Medicine Provider Triage Evaluation Note  Russell [name] , a 33 y.o. male  was evaluated in triage.  Pt complains of groin swelling that has been ongoing for quite some time now.  He was seen previously at urgent care and prescribed antibiotics which he states initially helped but soon as the antibiotic course was completed the swelling got worse.  Reports subjective fevers at home. No overlying erythema.  Review of Systems  Positive: As above Negative: As above  Physical Exam  BP (!) 144/88 (BP Location: Right Arm)   Pulse 100   Temp 98.5 F (36.9 C)   Resp 17   SpO2 99%  Gen:   Awake, no distress   Resp:  Normal effort  MSK:   Moves extremities without difficulty  Other:   Medical Decision Making  Medically screening exam initiated at 2:15 PM.  Appropriate orders placed.  Thao E Barris was informed that the remainder of the evaluation will be completed by another provider, this initial triage assessment does not replace that evaluation, and the importance of remaining in the ED until their evaluation is complete.     Hildegard Loge, PA-C 09/26/24 [phone]

## 2024-09-27 ENCOUNTER — Encounter: Payer: Self-pay | Admitting: Physician Assistant

## 2024-09-27 ENCOUNTER — Inpatient Hospital Stay: Payer: Self-pay

## 2024-09-27 ENCOUNTER — Other Ambulatory Visit: Payer: Self-pay | Admitting: Physician Assistant

## 2024-09-27 ENCOUNTER — Inpatient Hospital Stay: Payer: Self-pay | Attending: Physician Assistant | Admitting: Physician Assistant

## 2024-09-27 VITALS — BP 122/83 | HR 89 | Temp 97.7°F | Resp 16 | Ht 77.0 in | Wt 267.0 lb

## 2024-09-27 DIAGNOSIS — Z814 Family history of other substance abuse and dependence: Secondary | ICD-10-CM | POA: Insufficient documentation

## 2024-09-27 DIAGNOSIS — Z8249 Family history of ischemic heart disease and other diseases of the circulatory system: Secondary | ICD-10-CM | POA: Insufficient documentation

## 2024-09-27 DIAGNOSIS — Z810 Family history of intellectual disabilities: Secondary | ICD-10-CM | POA: Insufficient documentation

## 2024-09-27 DIAGNOSIS — Z811 Family history of alcohol abuse and dependence: Secondary | ICD-10-CM | POA: Insufficient documentation

## 2024-09-27 DIAGNOSIS — L299 Pruritus, unspecified: Secondary | ICD-10-CM | POA: Insufficient documentation

## 2024-09-27 DIAGNOSIS — R509 Fever, unspecified: Secondary | ICD-10-CM | POA: Insufficient documentation

## 2024-09-27 DIAGNOSIS — F17290 Nicotine dependence, other tobacco product, uncomplicated: Secondary | ICD-10-CM

## 2024-09-27 DIAGNOSIS — J45909 Unspecified asthma, uncomplicated: Secondary | ICD-10-CM | POA: Insufficient documentation

## 2024-09-27 DIAGNOSIS — F419 Anxiety disorder, unspecified: Secondary | ICD-10-CM

## 2024-09-27 DIAGNOSIS — F17220 Nicotine dependence, chewing tobacco, uncomplicated: Secondary | ICD-10-CM | POA: Insufficient documentation

## 2024-09-27 DIAGNOSIS — Z809 Family history of malignant neoplasm, unspecified: Secondary | ICD-10-CM | POA: Insufficient documentation

## 2024-09-27 DIAGNOSIS — Z9089 Acquired absence of other organs: Secondary | ICD-10-CM | POA: Insufficient documentation

## 2024-09-27 DIAGNOSIS — F32A Depression, unspecified: Secondary | ICD-10-CM

## 2024-09-27 DIAGNOSIS — Z808 Family history of malignant neoplasm of other organs or systems: Secondary | ICD-10-CM | POA: Insufficient documentation

## 2024-09-27 DIAGNOSIS — R61 Generalized hyperhidrosis: Secondary | ICD-10-CM | POA: Insufficient documentation

## 2024-09-27 DIAGNOSIS — Z818 Family history of other mental and behavioral disorders: Secondary | ICD-10-CM | POA: Insufficient documentation

## 2024-09-27 DIAGNOSIS — Z83438 Family history of other disorder of lipoprotein metabolism and other lipidemia: Secondary | ICD-10-CM | POA: Insufficient documentation

## 2024-09-27 DIAGNOSIS — Z880 Allergy status to penicillin: Secondary | ICD-10-CM | POA: Insufficient documentation

## 2024-09-27 DIAGNOSIS — R590 Localized enlarged lymph nodes: Secondary | ICD-10-CM | POA: Insufficient documentation

## 2024-09-27 DIAGNOSIS — R591 Generalized enlarged lymph nodes: Secondary | ICD-10-CM

## 2024-09-27 DIAGNOSIS — Z825 Family history of asthma and other chronic lower respiratory diseases: Secondary | ICD-10-CM | POA: Insufficient documentation

## 2024-09-27 DIAGNOSIS — Z79899 Other long term (current) drug therapy: Secondary | ICD-10-CM | POA: Insufficient documentation

## 2024-09-27 DIAGNOSIS — Z823 Family history of stroke: Secondary | ICD-10-CM | POA: Insufficient documentation

## 2024-09-27 DIAGNOSIS — F1290 Cannabis use, unspecified, uncomplicated: Secondary | ICD-10-CM | POA: Insufficient documentation

## 2024-09-27 DIAGNOSIS — Z813 Family history of other psychoactive substance abuse and dependence: Secondary | ICD-10-CM | POA: Insufficient documentation

## 2024-09-27 LAB — CBC WITH DIFFERENTIAL (CANCER CENTER ONLY)
Abs Immature Granulocytes: 0.09 K/uL — ABNORMAL HIGH (ref 0.00–0.07)
Basophils Absolute: 0.1 K/uL (ref 0.0–0.1)
Basophils Relative: 1 %
Eosinophils Absolute: 0.4 K/uL (ref 0.0–0.5)
Eosinophils Relative: 3 %
HCT: 41.1 % (ref 39.0–52.0)
Hemoglobin: 14.9 g/dL (ref 13.0–17.0)
Immature Granulocytes: 1 %
Lymphocytes Relative: 13 %
Lymphs Abs: 1.7 K/uL (ref 0.7–4.0)
MCH: 28.2 pg (ref 26.0–34.0)
MCHC: 36.3 g/dL — ABNORMAL HIGH (ref 30.0–36.0)
MCV: 77.8 fL — ABNORMAL LOW (ref 80.0–100.0)
Monocytes Absolute: 1.1 K/uL — ABNORMAL HIGH (ref 0.1–1.0)
Monocytes Relative: 9 %
Neutro Abs: 9.4 K/uL — ABNORMAL HIGH (ref 1.7–7.7)
Neutrophils Relative %: 73 %
Platelet Count: 309 K/uL (ref 150–400)
RBC: 5.28 MIL/uL (ref 4.22–5.81)
RDW: 12.1 % (ref 11.5–15.5)
WBC Count: 12.8 K/uL — ABNORMAL HIGH (ref 4.0–10.5)
nRBC: 0 % (ref 0.0–0.2)

## 2024-09-27 LAB — CMP (CANCER CENTER ONLY)
ALT: 23 U/L (ref 0–44)
AST: 31 U/L (ref 15–41)
Albumin: 4.3 g/dL (ref 3.5–5.0)
Alkaline Phosphatase: 107 U/L (ref 38–126)
Anion gap: 12 (ref 5–15)
BUN: 13 mg/dL (ref 6–20)
CO2: 26 mmol/L (ref 22–32)
Calcium: 9.6 mg/dL (ref 8.9–10.3)
Chloride: 102 mmol/L (ref 98–111)
Creatinine: 0.95 mg/dL (ref 0.61–1.24)
GFR, Estimated: >60 mL/min (ref >60)
Glucose, Bld: 73 mg/dL (ref 70–99)
Potassium: 3.8 mmol/L (ref 3.5–5.1)
Sodium: 139 mmol/L (ref 135–145)
Total Bilirubin: 1.6 mg/dL — ABNORMAL HIGH (ref 0.0–1.2)
Total Protein: 8 g/dL (ref 6.5–8.1)

## 2024-09-27 LAB — LACTATE DEHYDROGENASE: LDH: 206 U/L — ABNORMAL HIGH (ref 98–192)

## 2024-09-27 LAB — HEPATITIS C ANTIBODY: HCV Ab: NONREACTIVE

## 2024-09-27 LAB — HIV ANTIBODY (ROUTINE TESTING W REFLEX): HIV Screen 4th Generation wRfx: NONREACTIVE

## 2024-09-27 LAB — SEDIMENTATION RATE: Sed Rate: 99 mm/h — ABNORMAL HIGH (ref 0–16)

## 2024-09-27 LAB — HEPATITIS B SURFACE ANTIGEN: Hepatitis B Surface Ag: NONREACTIVE

## 2024-09-27 LAB — PSA: Prostatic Specific Antigen: 4.1 ng/mL — ABNORMAL HIGH (ref 0.00–4.00)

## 2024-09-27 LAB — C-REACTIVE PROTEIN: CRP: 10.9 mg/dL — ABNORMAL HIGH (ref <1.0)

## 2024-09-27 LAB — HEPATITIS B SURFACE ANTIBODY,QUALITATIVE: Hep B S Ab: NONREACTIVE

## 2024-09-27 NOTE — Progress Notes (Signed)
 [facility]   Patient presented to clinic, for his scheduled appointment with [place] [employer] PA-C. I Introduced myself and provided them with my direct contact information. Patient was encouraged to call me with any questions/ concerns they have. Patient voiced understanding.   Selinda An, RN Rapid diagnostic Navigator

## 2024-09-27 NOTE — Progress Notes (Signed)
 Rapid Diagnostic Clinic [facility] [phone]   [phone]  INITIAL CONSULTATION:  Patient Care Team: Pcp, No as PCP - General Leonetti, Forestine BROCKS, RN as Oncology Nurse Navigator (Medical Oncology) Caro Selinda LABOR, RN as Oncology Nurse Navigator  CHIEF COMPLAINTS/PURPOSE OF CONSULTATION:  Right inguinal lymphadenopathy  HISTORY OF PRESENTING ILLNESS:  Russell [name] 33 y.o. male with medical history significant for anxiety, depression and asthma presents to the rapid diagnostic clinic for evaluation for right inguinal lymphadenopathy. He is unaccompanied for this visit.   On review of the previous records, [name] presented to the urgent care on 09/02/2024 with intermittent episodes of right groin pain and swelling for 3 weeks.  Patient was treated for infectious process with doxycycline  x 7 days.  Due to persistent lymphadenopathy and swelling, he presented to the emergency room on 09/26/2024.  CT abdomen pelvis was obtained which showed extensive adenopathy involving the right inguinal, iliac and periaortic regions.  Largest lymph node was measuring 6.8 x 2.6 cm in the right inguinal region.  On exam today, [name] reports his energy levels are fairly stable.  He works full-time in [employer].  Patient is noted to lose approximately 50 pounds in January 2025.  He reports his weight loss is intentional due to his profession in [employer] and lots of physical demand.  He has noticed intermittent episodes of night sweats in the last 3 weeks without requiring changing his sheets or close.  He denies nausea, vomiting or bowel habit changes.  He did notice a fever last week with a Tmax of 102 but had bodyaches with a runny nose, suspicious for viral infection.  He reports having either hot flashes versus fevers but has not checked his temperature.  He has not started his second round of doxycycline  that was prescribed yesterday.  He has no other palpable  lumps or bumps outside of the right inguinal region.  He does report bilateral inguinal itching without rash.He denies any urinary symptoms or penile discharge. He denies chills, shortness of breath, chest pain or cough.  Rest of the ROS is below.   MEDICAL HISTORY:  Past Medical History:  Diagnosis Date   Allergy    Anxiety    Asthma    Asthma 01/13/2023   Depression    Left elbow pain 01/13/2023   Elbow > shoulder Radiates to numbing sensation in pinky and ring finger. Not lifting heavy stuff   Obesity     SURGICAL HISTORY: Past Surgical History:  Procedure Laterality Date   TONSILLECTOMY     TYMPANOSTOMY TUBE PLACEMENT      SOCIAL HISTORY: Social History   Socioeconomic History   Marital status: Significant Other    Spouse name: Not on file   Number of children: Not on file   Years of education: Not on file   Highest education level: Not on file  Occupational History   Not on file  Tobacco Use   Smoking status: Never   Smokeless tobacco: Current    Types: Chew  Vaping Use   Vaping status: Some Days  Substance and Sexual Activity   Alcohol use: Not Currently   Drug use: Yes    Frequency: 7.0 times per week    Types: Marijuana    Comment: smoke marijuana   Sexual activity: Yes  Other Topics Concern   Not on file  Social History Narrative   Not on file   Social Drivers of Health   Financial Resource Strain: Not  on file  Food Insecurity: Not on file  Transportation Needs: Not on file  Physical Activity: Not on file  Stress: Not on file  Social Connections: Not on file  Intimate Partner Violence: Not on file    FAMILY HISTORY: Family History  Problem Relation Age of Onset   Hyperlipidemia Mother    Early death Mother    Hypertension Mother    Mental illness Mother    Alcohol abuse Father    Asthma Father    COPD Father    Drug abuse Father    Early death Father    Hypertension Father    Hyperlipidemia Father    Stroke Father    Heart attack  Father    Cancer Maternal Grandmother    Heart disease Maternal Grandfather    Skin cancer Maternal Grandfather    Mental retardation Maternal Uncle     ALLERGIES:  is allergic to penicillins.  MEDICATIONS:  Current Outpatient Medications  Medication Sig Dispense Refill   albuterol  (VENTOLIN  HFA) 108 (90 Base) MCG/ACT inhaler Inhale 1-2 puffs into the lungs every 6 (six) hours as needed. 18 g 2   cetirizine  (ZYRTEC ) 10 MG tablet Take 1 tablet (10 mg total) by mouth daily. 30 tablet 1   doxycycline  (VIBRAMYCIN ) 100 MG capsule Take 1 capsule (100 mg total) by mouth 2 (two) times daily. 20 capsule 0   fluticasone  (FLONASE ) 50 MCG/ACT nasal spray Place 2 sprays into both nostrils daily. 16 g 0   mometasone -formoterol  (DULERA ) 200-5 MCG/ACT AERO Inhale 2 puffs into the lungs 2 (two) times daily. 13 g 0   ondansetron  (ZOFRAN -ODT) 4 MG disintegrating tablet Take 1 tablet (4 mg total) by mouth every 8 (eight) hours as needed for nausea or vomiting. 20 tablet 0   oxyCODONE (ROXICODONE) 5 MG immediate release tablet Take 1 tablet (5 mg total) by mouth every 6 (six) hours as needed for severe pain (pain score 7-10). 10 tablet 0   No current facility-administered medications for this visit.    REVIEW OF SYSTEMS:   Constitutional: ( - ) fevers, ( - )  chills , ( +) night sweats Eyes: ( - ) blurriness of vision, ( - ) double vision, ( - ) watery eyes Ears, nose, mouth, throat, and face: ( - ) mucositis, ( - ) sore throat Respiratory: ( - ) cough, ( - ) dyspnea, ( - ) wheezes Cardiovascular: ( - ) palpitation, ( - ) chest discomfort, ( - ) lower extremity swelling Gastrointestinal:  ( - ) nausea, ( - ) heartburn, ( - ) change in bowel habits Skin: ( - ) abnormal skin rashes Lymphatics: ( - ) new lymphadenopathy, ( - ) easy bruising Neurological: ( - ) numbness, ( - ) tingling, ( - ) new weaknesses Behavioral/Psych: ( - ) mood change, ( - ) new changes  All other systems were reviewed with the  patient and are negative.  PHYSICAL EXAMINATION: ECOG PERFORMANCE STATUS: 1 - Symptomatic but completely ambulatory  Vitals:   09/27/24 1355  BP: 122/83  Pulse: 89  Resp: 16  Temp: 97.7 F (36.5 C)  SpO2: 100%   Filed Weights   09/27/24 1355  Weight: 267 lb (121.1 kg)    GENERAL: well appearing male in NAD  SKIN: skin color, texture, turgor are normal, no rashes or significant lesions EYES: conjunctiva are pink and non-injected, sclera clear OROPHARYNX: no exudate, no erythema; lips, buccal mucosa, and tongue normal  NECK: supple, non-tender LYMPH:  no palpable  lymphadenopathy in the cervical, axillary or supraclavicular lymph nodes. Large, non-tender firm mass in the inguinal inguinal region (closer to proximal thigh) LUNGS: clear to auscultation and percussion with normal breathing effort HEART: regular rate & rhythm and no murmurs and no lower extremity edema ABDOMEN: soft, non-tender, non-distended, normal bowel sounds. No hepatosplenomegaly Musculoskeletal: no cyanosis of digits and no clubbing  PSYCH: alert & oriented x 3, fluent speech NEURO: no focal motor/sensory deficits  LABORATORY DATA:  I have reviewed the data as listed    Latest Ref Rng & Units 09/26/2024   10:13 AM 01/13/2023   10:42 AM 07/12/2015    3:50 PM  CBC  WBC 4.0 - 10.5 K/uL 12.5  6.6  6.5   Hemoglobin 13.0 - 17.0 g/dL 85.5  85.1  84.8   Hematocrit 39.0 - 52.0 % 41.6  42.0  44.3   Platelets 150 - 400 K/uL 308  245.0         Latest Ref Rng & Units 09/26/2024   10:13 AM 01/13/2023   10:42 AM  CMP  Glucose 70 - 99 mg/dL 899  94   BUN 6 - 20 mg/dL 11  13   Creatinine 9.38 - 1.24 mg/dL 9.03  9.09   Sodium 864 - 145 mmol/L 137  140   Potassium 3.5 - 5.1 mmol/L 3.6  4.1   Chloride 98 - 111 mmol/L 103  107   CO2 22 - 32 mmol/L 22  24   Calcium 8.9 - 10.3 mg/dL 8.9  9.0   Total Protein 6.0 - 8.3 g/dL  6.9   Total Bilirubin 0.2 - 1.2 mg/dL  1.1   Alkaline Phos 39 - 117 U/L  41   AST 0 - 37 U/L   15   ALT 0 - 53 U/L  26      RADIOGRAPHIC STUDIES: I have personally reviewed the radiological images as listed and agreed with the findings in the report. CT ABDOMEN PELVIS W CONTRAST Result Date: 09/26/2024 CLINICAL DATA:  Groin swelling. EXAM: CT ABDOMEN AND PELVIS WITH CONTRAST TECHNIQUE: Multidetector CT imaging of the abdomen and pelvis was performed using the standard protocol following bolus administration of intravenous contrast. RADIATION DOSE REDUCTION: This exam was performed according to the departmental dose-optimization program which includes automated exposure control, adjustment of the mA and/or kV according to patient size and/or use of iterative reconstruction technique. CONTRAST:  100mL OMNIPAQUE IOHEXOL 350 MG/ML SOLN COMPARISON:  None Available. FINDINGS: Lower chest: No acute abnormality. Hepatobiliary: No focal liver abnormality is seen. No gallstones, gallbladder wall thickening, or biliary dilatation. Pancreas: Unremarkable. No pancreatic ductal dilatation or surrounding inflammatory changes. Spleen: Normal in size without focal abnormality. Adrenals/Urinary Tract: Adrenal glands are unremarkable. Kidneys are normal, without renal calculi, focal lesion, or hydronephrosis. Bladder is unremarkable. Stomach/Bowel: Stomach is within normal limits. Appendix appears normal. No evidence of bowel wall thickening, distention, or inflammatory changes. Vascular/Lymphatic: Extensive adenopathy is seen involving the right inguinal, iliac and periaortic regions, which may be malignant or inflammatory in etiology. Tissue sampling is recommended. Largest lymph node measures 6.8 x 2.6 cm inferior right inguinal region. 3.0 x 2.4 cm right external iliac lymph node is noted. 4.3 x 1.4 cm right periaortic lymph node is noted. Reproductive: Prostate is unremarkable. Other: No ascites or hernia is noted. Musculoskeletal: No acute or significant osseous findings. IMPRESSION: Extensive adenopathy is seen  involving the right inguinal, iliac and periaortic regions, which may be malignant or inflammatory in etiology. Tissue sampling is recommended.  Electronically Signed   By: Lynwood Landy Raddle M.D.   On: 09/26/2024 15:15    ASSESSMENT & PLAN Russell [name] is a 33 y.o. male who presents to the diagnostic clinic for evaluation of right inguinal lymphadenopathy.   #Lymphadenopathy: --Differentials include infectious process, inflammatory process, lymphoproliferative disorder or metastatic disease.  --Labs today to check CBC, CMP, LDH, flow cytometry, PSA, Hepatitis panel, HIV, RPR, ESR, CRP, GC urine.  --Referral sent to general surgery for evaluation of excisional biopsy.  --Continue to take ibuprofen  and tylenol  for pain control. Patient has history of narcotic dependence so would like to avoid opioids.  --Okay to continue 10 day supply of doxycycline  that was prescribed yesterday.  --RTC once workup is complete.    Orders Placed This Encounter  Procedures   CBC with Differential (Cancer Center Only)    Standing Status:   Future    Expiration Date:   09/27/2025   CMP (Cancer Center only)    Standing Status:   Future    Expiration Date:   09/27/2025   Lactate dehydrogenase (LDH)    Standing Status:   Future    Expiration Date:   09/27/2025   Sedimentation rate    Standing Status:   Future    Expiration Date:   09/27/2025   C-reactive protein    Standing Status:   Future    Expiration Date:   09/27/2025   Flow Cytometry, Peripheral Blood (Oncology)    Standing Status:   Future    Expiration Date:   09/27/2025   PSA (For [facility])    Standing Status:   Future    Expiration Date:   09/27/2025   HIV antibody (with reflex)    Standing Status:   Future    Expiration Date:   09/27/2025   Hepatitis B surface antibody    Standing Status:   Future    Expiration Date:   09/27/2025   Hepatitis B surface antigen    Standing Status:   Future    Expiration Date:   09/27/2025   Hepatitis  C antibody    Standing Status:   Future    Expiration Date:   09/27/2025   Hepatitis B core antibody, total    Standing Status:   Future    Expiration Date:   09/27/2025   RPR    Standing Status:   Future    Expiration Date:   09/27/2025   GC probe amplification, urine    All questions were answered. The patient knows to call the clinic with any problems, questions or concerns.  I have spent a total of 60 minutes minutes of face-to-face and non-face-to-face time, preparing to see the patient, obtaining and/or reviewing separately obtained history, performing a medically appropriate examination, counseling and educating the patient, ordering medications/tests/procedures, referring and communicating with other health care professionals, documenting clinical information in the electronic health record, independently interpreting results and communicating results to the patient, and care coordination.   Johnston Police, PA-C Department of Hematology/Oncology [facility] at [facility] Phone: [phone]   Patient was seen with Dr. Federico  I have read the above note and personally examined the patient. I agree with the assessment and plan as noted above.  Briefly Russell [name] is a 33 year old male who presents for evaluation of inguinal lymphadenopathy.  He notes this has developed over the last several weeks.  CT scan performed on 09/26/2024 showed adenopathy in the right inguinal and iliac as well  as periaortic regions.  Largest lymph node measured 6.8 x 2.6 cm in the right inguinal region.  Additionally there is a 3.0 x 2.4 cm right external iliac node.  At this time findings are highly suspicious for malignancy, though infectious etiology and laboratory disorder are on the differential.  We will order flow cytometry as well as inflammatory markers with ESR and CRP.  Additionally we requested an excisional biopsy but if excisional biopsy is not feasible would recommend  pursuing core biopsy.  The patient voiced understanding of our findings and plan moving forward.   Norleen IVAR Kidney, MD Department of Hematology/Oncology [facility] at [facility] Phone: [phone] Pager: [phone] Email: [email or web address]

## 2024-09-27 NOTE — Patient Instructions (Signed)
 Rapid Diagnostic Service Visit Discharge Information and Instructions  Thank you for choosing [facility] for your healthcare needs.  Below is a summary of today's discussion, along with our contact information and an outline of what to expect next.  Reason for Visit:  Lymphadenopathy  Proposed Diagnostic Care Plan: Labs Biopsy (Excisional versus core needle)   What to Expect: - Generally, when lab tests are ordered the results can take up to 1 week for results to be available.  At that point, we will contact you to discuss your results with you.  Unless there is a critical result, we will typically wait for all of your lab results to be available before contacting you. - If a biopsy is part of your Care Plan, those results can take on average 7-10 days to result.  Once results are available, we will contact you to discuss your pathology results and any next steps. - If you have additional imaging ordered, such as a CT Scan, MRI, Ultrasound, Bone Scan, or PET scan, your imaging will need to be authorized then scheduled with the earliest available appointment.  You may be asked to travel to another hospital within [facility] who has a sooner availability, please consider doing so if asked. - If you use MyChart, your results will be available to you in the MyChart portal.  Your provider will be in touch with you as soon as all of your results are available to be discussed.  Your Diagnostic Clinic Provider:  Johnston Police PA-C and Dr. Norleen Kidney, contact number [phone] Your Diagnostic Navigator:  Colene Raider RN, contact number [phone]  If you or your caregiver have number blocking on your cell phones, please ensure the cancer center's numbers are not blocked.  If you are not a registered MyChart user, please consider enrolling in MyChart to receive your test results and visit notes.  You can also access your discharge instructions electronically.  MyChart also gives you an  electronic means to communicate with your Care Team instead of needing to call in to the cancer center.  We appreciate you trusting us  with your healthcare and look forward to partnering with you as we work to uncover what your potential diagnosis may be.  Please do not hesitate to reach out at any point with questions or concerns.

## 2024-09-28 LAB — HEPATITIS B CORE ANTIBODY, TOTAL: HEP B CORE AB: NEGATIVE

## 2024-09-28 LAB — RPR: RPR Ser Ql: NONREACTIVE

## 2024-09-30 ENCOUNTER — Other Ambulatory Visit (HOSPITAL_COMMUNITY)
Admission: RE | Admit: 2024-09-30 | Discharge: 2024-09-30 | Disposition: A | Discharge status: Home / Self Care | Payer: Self-pay | Source: Ambulatory Visit | Attending: Physician Assistant | Admitting: Physician Assistant

## 2024-09-30 ENCOUNTER — Encounter (HOSPITAL_COMMUNITY): Payer: Self-pay

## 2024-09-30 ENCOUNTER — Other Ambulatory Visit: Payer: Self-pay | Admitting: Physician Assistant

## 2024-09-30 ENCOUNTER — Encounter: Payer: Self-pay | Admitting: Medical Oncology

## 2024-09-30 DIAGNOSIS — R591 Generalized enlarged lymph nodes: Secondary | ICD-10-CM | POA: Insufficient documentation

## 2024-09-30 LAB — SURGICAL PATHOLOGY

## 2024-09-30 NOTE — Progress Notes (Signed)
 Rapid Diagnostic Services  Patient informed this morning, per Irene Thayil PA-C, that surgeon recommended core needle biopsy first instead of a surgical excision. Patient knows to expect call from scheduling. Patient denies any questions at this time.  Encouraged to call with questions/concerns.   Colene KYM Raider, RN, BSN, [facility] Oncology Nurse Navigator, Rapid Diagnostic Services 09/30/2024 9:57 AM

## 2024-09-30 NOTE — Progress Notes (Signed)
  PROCEDURE / BIOPSY REVIEW Date: 09/30/24  Requested Biopsy site: LNs Reason for request: adenopathy. No DX Imaging review: Best seen on CT AP  Decision: Approved Imaging modality to perform: Ultrasound Schedule with: No sedation / Local anesthetic Schedule for: Any VIR  Additional comments:  Rapid Diagnostic Clinic [facility]) Pt. Expedite request per referring provider. @Schedulers . US  R inguinal LN Bx. Local only  Please contact me with questions, concerns, or if issue pertaining to this request arise.  Thom Hall, MD [facility] Radiolog   ___________________________________________________  Karalee Wilkie POUR, MD  Brayson Livesey Approved for US  guided CORE biopsy of RIGHT INGUINAL LYMPH NODE.  Extra cores requested for molecular testing.  No sedation.  HKM       Previous Messages    ----- Message ----- From: Vale Peraza Sent: 09/30/2024  11:15 AM EDT To: Kerly Rigsbee; Ir Procedure Requests Subject: US  core Biopsy ( lymph nodes)                  Procedure : US  Core Biopsy ( Lymph nodes)  Reason : right inguinal lymphadenopathy. Please collect extra core biopsies for molecular testing. Dx: Lymphadenopathy [R59.1 (ICD-10-CM)]    History : Ct abd pelv w/  Provider : Neomi Johnston DASEN, PA-C  Contact: [phone]

## 2024-10-01 LAB — FLOW CYTOMETRY

## 2024-10-01 LAB — URINE CYTOLOGY ANCILLARY ONLY
Chlamydia: NEGATIVE
Comment: NEGATIVE
Comment: NORMAL
Neisseria Gonorrhea: NEGATIVE

## 2024-10-04 ENCOUNTER — Encounter: Payer: Self-pay | Admitting: Physician Assistant

## 2024-10-07 NOTE — Progress Notes (Signed)
 Contacted Mr Garriga to let him know that we would be referring him to Urology for evaluation of his elevated PSA. Left voicemail to call us  back. Will follow up.  Selinda An, RN Oncology Nurse Navigator, Rapid Diagnostic Services  10/07/2024 8:44 AM

## 2024-10-10 ENCOUNTER — Ambulatory Visit (HOSPITAL_COMMUNITY)
Admission: RE | Admit: 2024-10-10 | Discharge: 2024-10-10 | Disposition: A | Discharge status: Home / Self Care | Payer: Self-pay | Source: Ambulatory Visit | Attending: Physician Assistant | Admitting: Physician Assistant

## 2024-10-10 DIAGNOSIS — R591 Generalized enlarged lymph nodes: Secondary | ICD-10-CM

## 2024-10-10 DIAGNOSIS — R590 Localized enlarged lymph nodes: Secondary | ICD-10-CM | POA: Insufficient documentation

## 2024-10-10 DIAGNOSIS — C8460 Anaplastic large cell lymphoma, ALK-positive, unspecified site: Secondary | ICD-10-CM | POA: Insufficient documentation

## 2024-10-10 MED ORDER — LIDOCAINE HCL 1 % IJ SOLN
INTRAMUSCULAR | Status: AC
  Filled 2024-10-10: qty 20

## 2024-10-10 NOTE — Procedures (Signed)
 Interventional Radiology Procedure Note  Procedure: US  Guided Biopsy of enlarged right inguinal lymph node  Complications: None  Estimated Blood Loss: < 10 mL  Findings: 16 G core biopsy of right inguinal lymph node performed under US  guidance.  Five core samples obtained and sent to Pathology.  Marcey DASEN. [place], M.D Pager:  [phone]

## 2024-10-15 ENCOUNTER — Telehealth: Payer: Self-pay | Admitting: Physician Assistant

## 2024-10-15 ENCOUNTER — Encounter: Payer: Self-pay | Admitting: Medical Oncology

## 2024-10-15 DIAGNOSIS — C8586 Other specified types of non-Hodgkin lymphoma, intrapelvic lymph nodes: Secondary | ICD-10-CM

## 2024-10-15 LAB — SURGICAL PATHOLOGY

## 2024-10-15 NOTE — Telephone Encounter (Signed)
 I called Mr. Russell [name] to review the biopsy results from 10/10/2024. 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 Patient will follow up with Dr. Federico on 10/25/2024 to review diagnosis and treatment recommendations. We will arrange for staging PET/CT, baseline echocardiogram and request port placement in anticipation for chemotherapy.   [name] was given time to ask questions and is agreeable with the plan.

## 2024-10-15 NOTE — Progress Notes (Signed)
 Rapid Diagnostic Services  LVM with patient inquiring of his availability so that I may schedule an echocardiogram as well as port placement. My direct call back number provided. Asked patient to call back at his earliest convenience.   Colene KYM Raider, RN, BSN, [facility] Oncology Nurse Navigator, Rapid Diagnostic Services 10/15/2024 4:39 PM

## 2024-10-16 ENCOUNTER — Encounter: Payer: Self-pay | Admitting: Medical Oncology

## 2024-10-16 ENCOUNTER — Other Ambulatory Visit: Payer: Self-pay | Admitting: Physician Assistant

## 2024-10-16 DIAGNOSIS — J4541 Moderate persistent asthma with (acute) exacerbation: Secondary | ICD-10-CM

## 2024-10-16 MED ORDER — ALBUTEROL SULFATE HFA 108 (90 BASE) MCG/ACT IN AERS: 1.0000 | INHALATION_SPRAY | Freq: Four times a day (QID) | RESPIRATORY_TRACT | 2 refills | Status: DC | PRN

## 2024-10-16 NOTE — Progress Notes (Signed)
  Rapid Diagnostic Service for [facility]  Diagnostic Nurse Navigator Treatment Team Hand-Off Note  10/16/24  Patient Name:  Russell [name] Patient MRN:  [id number] Patient DOB:  Feb 21, 1991   Patient Care Team: Pcp, No as PCP - General [name], RN as Oncology Nurse Navigator (Medical Oncology) Caro Selinda LABOR, RN as Oncology Nurse Navigator  Chief Complaint ALK Positive Anaplastic Large Cell Lymphoma  Oncology History   No history exists.    Cancer Staging  No matching staging information was found for the patient.   SDOH Screening and Interventions Updated:  No  SDOH Screenings   Depression (PHQ2-9): Medium Risk (01/13/2023)  Tobacco Use: High Risk (09/27/2024)     Genetics Assessment Completed:  No Genetics Referral Made:  no  Care Team Updated:  Yes  Colene KYM Golden, RN, BSN, [facility] Oncology Nurse Navigator, Rapid Diagnostic Services 10/16/2024 3:42 PM

## 2024-10-17 ENCOUNTER — Encounter (HOSPITAL_COMMUNITY)
Admission: RE | Admit: 2024-10-17 | Discharge: 2024-10-17 | Disposition: A | Discharge status: Home / Self Care | Payer: Self-pay | Source: Ambulatory Visit | Attending: Physician Assistant | Admitting: Physician Assistant

## 2024-10-17 DIAGNOSIS — C8586 Other specified types of non-Hodgkin lymphoma, intrapelvic lymph nodes: Secondary | ICD-10-CM | POA: Insufficient documentation

## 2024-10-17 LAB — GLUCOSE, CAPILLARY: Glucose-Capillary: 98 mg/dL (ref 70–99)

## 2024-10-17 MED ORDER — FLUDEOXYGLUCOSE F - 18 (FDG) INJECTION
11.0000 | Freq: Once | INTRAVENOUS | Status: AC
  Administered 2024-10-17: 13.31 via INTRAVENOUS

## 2024-10-18 ENCOUNTER — Ambulatory Visit (HOSPITAL_COMMUNITY)
Admission: RE | Admit: 2024-10-18 | Discharge: 2024-10-18 | Disposition: A | Discharge status: Home / Self Care | Payer: Self-pay | Source: Ambulatory Visit | Attending: Physician Assistant | Admitting: Physician Assistant

## 2024-10-18 DIAGNOSIS — I517 Cardiomegaly: Secondary | ICD-10-CM | POA: Insufficient documentation

## 2024-10-18 DIAGNOSIS — Z0189 Encounter for other specified special examinations: Secondary | ICD-10-CM

## 2024-10-18 DIAGNOSIS — C8586 Other specified types of non-Hodgkin lymphoma, intrapelvic lymph nodes: Secondary | ICD-10-CM | POA: Insufficient documentation

## 2024-10-18 LAB — ECHOCARDIOGRAM COMPLETE
Area-P 1/2: 3.74 cm2
Calc EF: 55 %
S' Lateral: 2.7 cm
Single Plane A2C EF: 59.2 %
Single Plane A4C EF: 48.5 %

## 2024-10-18 NOTE — Progress Notes (Signed)
  Echocardiogram 2D Echocardiogram has been performed.  Russell [name] 10/18/2024, 2:45 PM

## 2024-10-22 ENCOUNTER — Other Ambulatory Visit: Payer: Self-pay | Admitting: Radiology

## 2024-10-22 NOTE — H&P (Signed)
 Chief Complaint: Newly diagnosed ALK positive anaplastic large cell lymphoma; referred for Port-A-Cath placement to assist with treatment  Referring Provider(s): Dorsey,J  Supervising Physician: Philip Cornet  Patient Status: [facility] - Out-pt  History of Present Illness: Russell [name] is a 33 y.o. male with past medical history significant for anxiety, asthma, depression, obesity who presents now with newly diagnosed ALK positive anaplastic large cell lymphoma.  He is scheduled today for Port-A-Cath placement to assist with treatment.  He is known to IR team from right inguinal lymph node biopsy on 10/10/2024.  *** Patient is Full Code  Past Medical History:  Diagnosis Date   Allergy    Anxiety    Asthma    Asthma 01/13/2023   Depression    Left elbow pain 01/13/2023   Elbow > shoulder Radiates to numbing sensation in pinky and ring finger. Not lifting heavy stuff   Obesity     Past Surgical History:  Procedure Laterality Date   TONSILLECTOMY     TYMPANOSTOMY TUBE PLACEMENT      Allergies: Penicillins  Medications: Prior to Admission medications   Medication Sig Start Date End Date Taking? Authorizing Provider  albuterol  (VENTOLIN  HFA) 108 (90 Base) MCG/ACT inhaler Inhale 1-2 puffs into the lungs every 6 (six) hours as needed. 10/16/24   Neomi Johnston DASEN, PA-C  cetirizine  (ZYRTEC ) 10 MG tablet Take 1 tablet (10 mg total) by mouth daily. 06/15/24   Fleming, Zelda W, NP  doxycycline  (VIBRAMYCIN ) 100 MG capsule Take 1 capsule (100 mg total) by mouth 2 (two) times daily. 09/26/24   Francesca Elsie CROME, MD  fluticasone  (FLONASE ) 50 MCG/ACT nasal spray Place 2 sprays into both nostrils daily. 03/29/24   Burnette, Jennifer M, PA-C  mometasone -formoterol  (DULERA ) 200-5 MCG/ACT AERO Inhale 2 puffs into the lungs 2 (two) times daily. 09/12/23   Gladis Elsie BROCKS, PA-C  ondansetron  (ZOFRAN -ODT) 4 MG disintegrating tablet Take 1 tablet (4 mg total) by mouth every 8 (eight) hours as  needed for nausea or vomiting. 09/26/24   Francesca Elsie CROME, MD  oxyCODONE (ROXICODONE) 5 MG immediate release tablet Take 1 tablet (5 mg total) by mouth every 6 (six) hours as needed for severe pain (pain score 7-10). 09/26/24   Francesca Elsie CROME, MD  famotidine  (PEPCID ) 20 MG tablet Take 1 tablet (20 mg total) by mouth 2 (two) times daily. 08/08/19 10/08/20  Christopher Savannah, PA-C  Fluticasone -Salmeterol (ADVAIR DISKUS) 250-50 MCG/DOSE AEPB Inhale 1 puff into the lungs 2 (two) times daily. 08/15/19 10/08/20  Blaise Aleene KIDD, MD  montelukast  (SINGULAIR ) 10 MG tablet Take 1 tablet (10 mg total) by mouth at bedtime. 03/20/20 10/08/20  Alvia Corean CROME, FNP     Family History  Problem Relation Age of Onset   Hyperlipidemia Mother    Early death Mother    Hypertension Mother    Mental illness Mother    Alcohol abuse Father    Asthma Father    COPD Father    Drug abuse Father    Early death Father    Hypertension Father    Hyperlipidemia Father    Stroke Father    Heart attack Father    Cancer Maternal Grandmother    Heart disease Maternal Grandfather    Skin cancer Maternal Grandfather    Mental retardation Maternal Uncle     Social History   Socioeconomic History   Marital status: Significant Other    Spouse name: Not on file   Number of children: Not on  file   Years of education: Not on file   Highest education level: Not on file  Occupational History   Not on file  Tobacco Use   Smoking status: Never   Smokeless tobacco: Current    Types: Chew  Vaping Use   Vaping status: Some Days  Substance and Sexual Activity   Alcohol use: Not Currently   Drug use: Yes    Frequency: 7.0 times per week    Types: Marijuana    Comment: smoke marijuana   Sexual activity: Yes  Other Topics Concern   Not on file  Social History Narrative   Not on file   Social Drivers of Health   Financial Resource Strain: Not on file  Food Insecurity: Not on file  Transportation Needs: Not  on file  Physical Activity: Not on file  Stress: Not on file  Social Connections: Not on file       Review of Systems  Vital Signs:   Advance Care Plan: No documents on file    Physical Exam  Imaging: ECHOCARDIOGRAM COMPLETE Result Date: 10/18/2024    ECHOCARDIOGRAM REPORT   Patient Name:   Russell [name] Date of Exam: 10/18/2024 Medical Rec #:  [id number]      Height:       77.0 in Accession #:    [id number]     Weight:       267.0 lb Date of Birth:  04-23-1991     BSA:          2.529 m Patient Age:    32 years       BP:           122/83 mmHg Patient Gender: M              HR:           96 bpm. Exam Location:  Inpatient Procedure: 2D Echo (Both Spectral and Color Flow Doppler were utilized during            procedure). Indications:    Chemo  History:        Patient has no prior history of Echocardiogram examinations.                 Signs/Symptoms:Chemo.  Sonographer:    Norleen Amour Referring Phys: [id number] IRENE T THAYIL IMPRESSIONS  1. Note that the baseline strain is mildly reduced at -14.9% prior to chemotherapy initiation.  2. Left ventricular ejection fraction, by estimation, is 55 to 60%. Left ventricular ejection fraction by 2D MOD biplane is 55.0 %. The left ventricle has normal function. The left ventricle has no regional wall motion abnormalities. There is mild concentric left ventricular hypertrophy. Left ventricular diastolic parameters are consistent with Grade I diastolic dysfunction (impaired relaxation). The average left ventricular global longitudinal strain is -14.9 %. The global longitudinal strain is abnormal.  3. Right ventricular systolic function is normal. The right ventricular size is normal. Tricuspid regurgitation signal is inadequate for assessing PA pressure.  4. The mitral valve is normal in structure. Trivial mitral valve regurgitation. No evidence of mitral stenosis.  5. The aortic valve is tricuspid. Aortic valve regurgitation is not visualized. No aortic  stenosis is present.  6. The inferior vena cava is normal in size with greater than 50% respiratory variability, suggesting right atrial pressure of 3 mmHg. Comparison(s): No prior Echocardiogram. Conclusion(s)/Recommendation(s): Otherwise normal echocardiogram, with minor abnormalities described in the report. FINDINGS  Left Ventricle: Left ventricular ejection fraction, by estimation, is 55  to 60%. Left ventricular ejection fraction by 2D MOD biplane is 55.0 %. The left ventricle has normal function. The left ventricle has no regional wall motion abnormalities. The average left ventricular global longitudinal strain is -14.9 %. Strain was performed and the global longitudinal strain is abnormal. The left ventricular internal cavity size was normal in size. There is mild concentric left ventricular hypertrophy. Left  ventricular diastolic parameters are consistent with Grade I diastolic dysfunction (impaired relaxation). Right Ventricle: The right ventricular size is normal. No increase in right ventricular wall thickness. Right ventricular systolic function is normal. Tricuspid regurgitation signal is inadequate for assessing PA pressure. Left Atrium: Left atrial size was normal in size. Right Atrium: Right atrial size was normal in size. Pericardium: There is no evidence of pericardial effusion. Mitral Valve: The mitral valve is normal in structure. Trivial mitral valve regurgitation. No evidence of mitral valve stenosis. Tricuspid Valve: The tricuspid valve is normal in structure. Tricuspid valve regurgitation is trivial. No evidence of tricuspid stenosis. Aortic Valve: The aortic valve is tricuspid. Aortic valve regurgitation is not visualized. No aortic stenosis is present. Pulmonic Valve: The pulmonic valve was normal in structure. Pulmonic valve regurgitation is not visualized. No evidence of pulmonic stenosis. Aorta: The aortic root and ascending aorta are structurally normal, with no evidence of  dilitation. Venous: The inferior vena cava is normal in size with greater than 50% respiratory variability, suggesting right atrial pressure of 3 mmHg. IAS/Shunts: No atrial level shunt detected by color flow Doppler.  LEFT VENTRICLE PLAX 2D                        Biplane EF (MOD) LVIDd:         4.80 cm         LV Biplane EF:   Left LVIDs:         2.70 cm                          ventricular LV PW:         1.10 cm                          ejection LV IVS:        1.10 cm                          fraction by LVOT diam:     2.50 cm                          2D MOD LV SV:         58                               biplane is LV SV Index:   23                               55.0 %. LVOT Area:     4.91 cm LV IVRT:       77 msec         Diastology  LV e' medial:    5.77 cm/s                                LV E/e' medial:  10.1 LV Volumes (MOD)               LV e' lateral:   6.64 cm/s LV vol d, MOD    157.0 ml      LV E/e' lateral: 8.8 A2C: LV vol d, MOD    144.0 ml      2D Longitudinal A4C:                           Strain LV vol s, MOD    64.1 ml       2D Strain GLS   -14.9 % A2C:                           Avg: LV vol s, MOD    74.1 ml A4C: LV SV MOD A2C:   92.9 ml LV SV MOD A4C:   144.0 ml LV SV MOD BP:    86.1 ml RIGHT VENTRICLE             IVC RV Basal diam:  3.50 cm     IVC diam: 2.00 cm RV S prime:     12.00 cm/s TAPSE (M-mode): 2.5 cm      PULMONARY VEINS                             Diastolic Velocity: 44.80 cm/s                             S/D Velocity:       1.20                             Systolic Velocity:  52.80 cm/s LEFT ATRIUM             Index        RIGHT ATRIUM           Index LA diam:        4.10 cm 1.62 cm/m   RA Area:     19.70 cm LA Vol (A2C):   54.0 ml 21.35 ml/m  RA Volume:   52.70 ml  20.84 ml/m LA Vol (A4C):   42.2 ml 16.69 ml/m LA Biplane Vol: 47.8 ml 18.90 ml/m  AORTIC VALVE             PULMONIC VALVE LVOT Vmax:   78.20 cm/s  PV Vmax:       0.94 m/s LVOT Vmean:   50.800 cm/s PV Peak grad:  3.6 mmHg LVOT VTI:    0.119 m  AORTA Ao Root diam: 3.80 cm Ao Asc diam:  3.40 cm MITRAL VALVE MV Area (PHT): 3.74 cm    SHUNTS MV Decel Time: 203 msec    Systemic VTI:  0.12 m MV E velocity: 58.20 cm/s  Systemic Diam: 2.50 cm MV A velocity: 82.30 cm/s MV E/A ratio:  0.71 Georganna Archer Electronically signed by Georganna Archer Signature Date/Time: 10/18/2024/3:10:14 PM    Final    NM PET Image Initial (PI) Skull Base To Thigh Result Date: 10/17/2024  EXAM: PET AND CT SKULL BASE TO MID THIGH 10/17/2024 05:14:42 PM TECHNIQUE: RADIOPHARMACEUTICAL: 13.31 mCi F-18 FDG Uptake time 60 minutes. Glucose level 98 mg/dl. Blood pool SUV 2.4; hepatic parenchyma SUV 3.6. PET imaging was acquired from the base of the skull to the mid thighs. Non-contrast enhanced computed tomography was obtained for attenuation correction and anatomic localization. COMPARISON: None available. CLINICAL HISTORY: Newly diagnosed lymphoma, staging. FINDINGS: HEAD AND NECK: Nonspecific posterior nasopharyngeal activity with maximum SUV 7.3. Mildly asymmetric palatine tonsillar activity with maximum SUV on the left at 7.1 and on the right at 4.8. No metabolically active cervical lymphadenopathy. CHEST: 0.8 cm right axillary lymph node on image 83 series 4 has a maximum SUV of 11.2, L5. Out metabolic activity, maximum SUV 3.9, this is a relatively non masslike configuration and is stable on the CT data from 03/10/2015, favoring thymic tissue over lymphoma involvement. No metabolically active pulmonary nodules. ABDOMEN AND PELVIS: Hypermetabolic retroperitoneal, mesenteric, right external iliac, and right inguinal adenopathy. Index small lymph node adjacent to the SMA origin with maximum SUV 6.5, L4. Index left common iliac node 0.9 cm in short axis on image 172 series 4 with maximum SUV 18.3, L5. Bulky right inguinal adenopathy including a lower inguinal node measuring 2.7 cm in short axis on image 250 series 4 with maximum  SUV 24.3, L5. Low grade stranding in the retroperitoneum and along the right pelvic sidewall and right groin region. No focal accentuated splenic activity identified. The spleen measures 12.5 x 6.8 x 12.0 cm (530 cc) compatible with mild splenomegaly. Physiologic activity within the gastrointestinal and genitourinary systems. BONES AND SOFT TISSUE: No abnormal FDG activity localizes to the bones. No metabolically active aggressive osseous lesion. IMPRESSION: 1. Hypermetabolic retroperitoneal, mesenteric, right external iliac, and right inguinal adenopathy, consistent with lymphoma involvement. 2. Hypermetabolic right axillary lymph node measuring 0.8 cm with maximum SUV 11.2, consistent with nodal involvement. 3. Nonspecific posterior nasopharyngeal hypermetabolism and mildly asymmetric palatine tonsillar activity, favor benign physiologic activity over neoplastic involvement. 4. Mild splenomegaly without focal accentuated splenic activity. Electronically signed by: Ryan Salvage MD 10/17/2024 05:43 PM EDT RP Workstation: [id number]   US  CORE BIOPSY (LYMPH NODES) Result Date: 10/10/2024 INDICATION: Right inguinal, iliac and para-aortic lymphadenopathy. The patient presents for right inguinal lymph node biopsy. EXAM: ULTRASOUND GUIDED CORE BIOPSY OF RIGHT INGUINAL LYMPH NODE MEDICATIONS: None. ANESTHESIA/SEDATION: None PROCEDURE: The procedure, risks, benefits, and alternatives were explained to the patient. Questions regarding the procedure were encouraged and answered. The patient understands and consents to the procedure. A time out was performed prior to initiating the procedure. The right groin region was prepped with chlorhexidine in a sterile fashion, and a sterile drape was applied covering the operative field. A sterile gown and sterile gloves were used for the procedure. Local anesthesia was provided with 1% Lidocaine . Enlarged right inguinal lymph nodes were localized by ultrasound. A 16 gauge core  biopsy device was utilized in obtaining 5 core biopsy samples adjacent enlarged lymph nodes in the anterior right inguinal region. Post biopsy ultrasound images were obtained. COMPLICATIONS: None immediate. FINDINGS: Multitude of enlarged right inguinal lymph nodes are identified. Two adjacent anterior nodes each measuring up to approximately 3.4 cm in maximal diameter were targeted with multiple samples obtained in both lymph nodes. IMPRESSION: Ultrasound-guided core biopsy performed of enlarged right inguinal lymph nodes. Electronically Signed   By: Marcey Moan M.D.   On: 10/10/2024 15:55   CT ABDOMEN PELVIS W CONTRAST Result Date: 09/26/2024 CLINICAL DATA:  Groin swelling.  EXAM: CT ABDOMEN AND PELVIS WITH CONTRAST TECHNIQUE: Multidetector CT imaging of the abdomen and pelvis was performed using the standard protocol following bolus administration of intravenous contrast. RADIATION DOSE REDUCTION: This exam was performed according to the departmental dose-optimization program which includes automated exposure control, adjustment of the mA and/or kV according to patient size and/or use of iterative reconstruction technique. CONTRAST:  100mL OMNIPAQUE IOHEXOL 350 MG/ML SOLN COMPARISON:  None Available. FINDINGS: Lower chest: No acute abnormality. Hepatobiliary: No focal liver abnormality is seen. No gallstones, gallbladder wall thickening, or biliary dilatation. Pancreas: Unremarkable. No pancreatic ductal dilatation or surrounding inflammatory changes. Spleen: Normal in size without focal abnormality. Adrenals/Urinary Tract: Adrenal glands are unremarkable. Kidneys are normal, without renal calculi, focal lesion, or hydronephrosis. Bladder is unremarkable. Stomach/Bowel: Stomach is within normal limits. Appendix appears normal. No evidence of bowel wall thickening, distention, or inflammatory changes. Vascular/Lymphatic: Extensive adenopathy is seen involving the right inguinal, iliac and periaortic regions,  which may be malignant or inflammatory in etiology. Tissue sampling is recommended. Largest lymph node measures 6.8 x 2.6 cm inferior right inguinal region. 3.0 x 2.4 cm right external iliac lymph node is noted. 4.3 x 1.4 cm right periaortic lymph node is noted. Reproductive: Prostate is unremarkable. Other: No ascites or hernia is noted. Musculoskeletal: No acute or significant osseous findings. IMPRESSION: Extensive adenopathy is seen involving the right inguinal, iliac and periaortic regions, which may be malignant or inflammatory in etiology. Tissue sampling is recommended. Electronically Signed   By: Lynwood Landy Raddle M.D.   On: 09/26/2024 15:15    Labs:  CBC: Recent Labs    09/26/24 1013 09/27/24 1543  WBC 12.5* 12.8*  HGB 14.4 14.9  HCT 41.6 41.1  PLT 308 309    COAGS: No results for input(s): INR, APTT in the last 8760 hours.  BMP: Recent Labs    09/26/24 1013 09/27/24 1543  NA 137 139  K 3.6 3.8  CL 103 102  CO2 22 26  GLUCOSE 100* 73  BUN 11 13  CALCIUM 8.9 9.6  CREATININE 0.96 0.95  GFRNONAA >60 >60    LIVER FUNCTION TESTS: Recent Labs    09/27/24 1543  BILITOT 1.6*  AST 31  ALT 23  ALKPHOS 107  PROT 8.0  ALBUMIN 4.3    TUMOR MARKERS: No results for input(s): AFPTM, CEA, CA199, CHROMGRNA in the last 8760 hours.  Assessment and Plan: 33 y.o. male with past medical history significant for anxiety, asthma, depression, obesity who presents now with newly diagnosed ALK positive anaplastic large cell lymphoma.  He is scheduled today for Port-A-Cath placement to assist with treatment.  He is known to IR team from right inguinal lymph node biopsy on 10/10/2024.Risks and benefits of image guided port-a-catheter placement was discussed with the patient including, but not limited to bleeding, infection, pneumothorax, or fibrin sheath development and need for additional procedures.  All of the patient's questions were answered, patient is agreeable to  proceed. Consent signed and in chart.    Thank you for allowing our service to participate in Russell [name] 's care.  Electronically Signed: D. Franky Rakers, PA-C   10/22/2024, 5:17 PM      I spent a total of   20 minutes  in face to face in clinical consultation, greater than 50% of which was counseling/coordinating care for port a cath placement ii

## 2024-10-23 ENCOUNTER — Encounter (HOSPITAL_COMMUNITY): Payer: Self-pay

## 2024-10-23 ENCOUNTER — Ambulatory Visit (HOSPITAL_COMMUNITY)
Admission: RE | Admit: 2024-10-23 | Discharge: 2024-10-23 | Disposition: A | Discharge status: Home / Self Care | Payer: Self-pay | Source: Ambulatory Visit | Attending: Physician Assistant | Admitting: Physician Assistant

## 2024-10-23 ENCOUNTER — Other Ambulatory Visit: Payer: Self-pay

## 2024-10-23 DIAGNOSIS — F17220 Nicotine dependence, chewing tobacco, uncomplicated: Secondary | ICD-10-CM | POA: Insufficient documentation

## 2024-10-23 DIAGNOSIS — C8586 Other specified types of non-Hodgkin lymphoma, intrapelvic lymph nodes: Secondary | ICD-10-CM | POA: Insufficient documentation

## 2024-10-23 HISTORY — PX: IR IMAGING GUIDED PORT INSERTION: IMG5740

## 2024-10-23 MED ORDER — FENTANYL CITRATE (PF) 100 MCG/2ML IJ SOLN
INTRAMUSCULAR | Status: AC | PRN
  Administered 2024-10-23: 50 ug via INTRAVENOUS
  Administered 2024-10-23 (×2): 25 ug via INTRAVENOUS

## 2024-10-23 MED ORDER — MIDAZOLAM HCL 2 MG/2ML IJ SOLN
INTRAMUSCULAR | Status: AC
  Filled 2024-10-23: qty 2

## 2024-10-23 MED ORDER — LIDOCAINE HCL 1 % IJ SOLN
20.0000 mL | Freq: Once | INTRAMUSCULAR | Status: AC
  Administered 2024-10-23: 7 mL via INTRADERMAL

## 2024-10-23 MED ORDER — FENTANYL CITRATE (PF) 100 MCG/2ML IJ SOLN
INTRAMUSCULAR | Status: AC
  Filled 2024-10-23: qty 2

## 2024-10-23 MED ORDER — HEPARIN SOD (PORK) LOCK FLUSH 100 UNIT/ML IV SOLN
INTRAVENOUS | Status: AC
Start: 2024-10-23 — End: 2024-10-23
  Filled 2024-10-23: qty 5

## 2024-10-23 MED ORDER — SODIUM CHLORIDE 0.9 % IV SOLN: INTRAVENOUS | Status: DC

## 2024-10-23 MED ORDER — HEPARIN SOD (PORK) LOCK FLUSH 100 UNIT/ML IV SOLN
500.0000 [IU] | Freq: Once | INTRAVENOUS | Status: AC
  Administered 2024-10-23: 500 [IU] via INTRAVENOUS

## 2024-10-23 MED ORDER — MIDAZOLAM HCL (PF) 2 MG/2ML IJ SOLN
INTRAMUSCULAR | Status: AC | PRN
  Administered 2024-10-23 (×4): .5 mg via INTRAVENOUS

## 2024-10-23 MED ORDER — LIDOCAINE-EPINEPHRINE 1 %-1:100000 IJ SOLN
20.0000 mL | Freq: Once | INTRAMUSCULAR | Status: AC
  Administered 2024-10-23: 20 mL via INTRADERMAL

## 2024-10-23 MED ORDER — LIDOCAINE-EPINEPHRINE 1 %-1:100000 IJ SOLN
INTRAMUSCULAR | Status: AC
  Filled 2024-10-23: qty 1

## 2024-10-23 MED ORDER — LIDOCAINE HCL 1 % IJ SOLN
INTRAMUSCULAR | Status: AC
  Filled 2024-10-23: qty 20

## 2024-10-23 NOTE — Procedures (Signed)
 Interventional Radiology Procedure:   Indications: Large cell lymphoma  Procedure: Port placement  Findings: Right jugular port, tip at SVC/RA junction.    Complications: None     EBL: Minimal, less than 10 ml  Plan: Discharge in one hour.  Keep port site and incisions dry for at least 24 hours.     Sartaj Hoskin R. Philip, MD  Pager: [phone]

## 2024-10-23 NOTE — Progress Notes (Signed)
 Appointment scheduled with Dr Federico 10/25/24. PET 10/30, ECHO 10/31, Port 11/5.

## 2024-10-23 NOTE — Discharge Instructions (Signed)

## 2024-10-24 ENCOUNTER — Other Ambulatory Visit: Payer: Self-pay | Admitting: Hematology and Oncology

## 2024-10-24 ENCOUNTER — Ambulatory Visit: Payer: Self-pay | Admitting: Family Medicine

## 2024-10-24 DIAGNOSIS — R591 Generalized enlarged lymph nodes: Secondary | ICD-10-CM

## 2024-10-24 NOTE — Progress Notes (Deleted)
 [facility] [phone]   [phone]  PROGRESS NOTE  Patient Care Team: Patient, No Pcp Per as PCP - General (General Practice) Elana Montie CROME, RN as Oncology Nurse Navigator  Hematological/Oncological History # Anaplastic T Cell Lymphoma, Alk Positive. Stage IV   Interval History:  Russell [name] 33 y.o. male with medical history significant for *** presents for a follow up visit. The patient's last visit was on ***. In the interim since the last visit ***  MEDICAL HISTORY:  Past Medical History:  Diagnosis Date   Allergy    Anxiety    Asthma    Asthma 01/13/2023   Depression    Left elbow pain 01/13/2023   Elbow > shoulder Radiates to numbing sensation in pinky and ring finger. Not lifting heavy stuff   Obesity     SURGICAL HISTORY: Past Surgical History:  Procedure Laterality Date   IR IMAGING GUIDED PORT INSERTION  10/23/2024   TONSILLECTOMY     TYMPANOSTOMY TUBE PLACEMENT      SOCIAL HISTORY: Social History   Socioeconomic History   Marital status: Significant Other    Spouse name: Not on file   Number of children: Not on file   Years of education: Not on file   Highest education level: Not on file  Occupational History   Not on file  Tobacco Use   Smoking status: Never   Smokeless tobacco: Current    Types: Chew  Vaping Use   Vaping status: Some Days  Substance and Sexual Activity   Alcohol use: Not Currently   Drug use: Yes    Frequency: 7.0 times per week    Types: Marijuana    Comment: smoke marijuana   Sexual activity: Yes  Other Topics Concern   Not on file  Social History Narrative   Not on file   Social Drivers of Health   Financial Resource Strain: Not on file  Food Insecurity: Not on file  Transportation Needs: Not on file  Physical Activity: Not on file  Stress: Not on file  Social Connections: Not on file  Intimate Partner Violence: Not on file    FAMILY HISTORY: Family History   Problem Relation Age of Onset   Hyperlipidemia Mother    Early death Mother    Hypertension Mother    Mental illness Mother    Alcohol abuse Father    Asthma Father    COPD Father    Drug abuse Father    Early death Father    Hypertension Father    Hyperlipidemia Father    Stroke Father    Heart attack Father    Cancer Maternal Grandmother    Heart disease Maternal Grandfather    Skin cancer Maternal Grandfather    Mental retardation Maternal Uncle     ALLERGIES:  is allergic to penicillins.  MEDICATIONS:  Current Outpatient Medications  Medication Sig Dispense Refill   albuterol  (VENTOLIN  HFA) 108 (90 Base) MCG/ACT inhaler Inhale 1-2 puffs into the lungs every 6 (six) hours as needed. 18 g 2   cetirizine  (ZYRTEC ) 10 MG tablet Take 1 tablet (10 mg total) by mouth daily. 30 tablet 1   doxycycline  (VIBRAMYCIN ) 100 MG capsule Take 1 capsule (100 mg total) by mouth 2 (two) times daily. 20 capsule 0   fluticasone  (FLONASE ) 50 MCG/ACT nasal spray Place 2 sprays into both nostrils daily. 16 g 0   mometasone -formoterol  (DULERA ) 200-5 MCG/ACT AERO Inhale 2 puffs into the lungs 2 (two) times  daily. 13 g 0   ondansetron  (ZOFRAN -ODT) 4 MG disintegrating tablet Take 1 tablet (4 mg total) by mouth every 8 (eight) hours as needed for nausea or vomiting. 20 tablet 0   oxyCODONE (ROXICODONE) 5 MG immediate release tablet Take 1 tablet (5 mg total) by mouth every 6 (six) hours as needed for severe pain (pain score 7-10). 10 tablet 0   No current facility-administered medications for this visit.    REVIEW OF SYSTEMS:   Constitutional: ( - ) fevers, ( - )  chills , ( - ) night sweats Eyes: ( - ) blurriness of vision, ( - ) double vision, ( - ) watery eyes Ears, nose, mouth, throat, and face: ( - ) mucositis, ( - ) sore throat Respiratory: ( - ) cough, ( - ) dyspnea, ( - ) wheezes Cardiovascular: ( - ) palpitation, ( - ) chest discomfort, ( - ) lower extremity swelling Gastrointestinal:  ( - )  nausea, ( - ) heartburn, ( - ) change in bowel habits Skin: ( - ) abnormal skin rashes Lymphatics: ( - ) new lymphadenopathy, ( - ) easy bruising Neurological: ( - ) numbness, ( - ) tingling, ( - ) new weaknesses Behavioral/Psych: ( - ) mood change, ( - ) new changes  All other systems were reviewed with the patient and are negative.  PHYSICAL EXAMINATION: ECOG PERFORMANCE STATUS: {CHL ONC ECOG PS:[phone]}  There were no vitals filed for this visit. There were no vitals filed for this visit.  GENERAL: alert, no distress and comfortable SKIN: skin color, texture, turgor are normal, no rashes or significant lesions EYES: conjunctiva are pink and non-injected, sclera clear OROPHARYNX: no exudate, no erythema; lips, buccal mucosa, and tongue normal  NECK: supple, non-tender LYMPH:  no palpable lymphadenopathy in the cervical, axillary or inguinal LUNGS: clear to auscultation and percussion with normal breathing effort HEART: regular rate & rhythm and no murmurs and no lower extremity edema ABDOMEN: soft, non-tender, non-distended, normal bowel sounds Musculoskeletal: no cyanosis of digits and no clubbing  PSYCH: alert & oriented x 3, fluent speech NEURO: no focal motor/sensory deficits  LABORATORY DATA:  I have reviewed the data as listed    Latest Ref Rng & Units 09/27/2024    3:43 PM 09/26/2024   10:13 AM 01/13/2023   10:42 AM  CBC  WBC 4.0 - 10.5 K/uL 12.8  12.5  6.6   Hemoglobin 13.0 - 17.0 g/dL 85.0  85.5  85.1   Hematocrit 39.0 - 52.0 % 41.1  41.6  42.0   Platelets 150 - 400 K/uL 309  308  245.0        Latest Ref Rng & Units 09/27/2024    3:43 PM 09/26/2024   10:13 AM 01/13/2023   10:42 AM  CMP  Glucose 70 - 99 mg/dL 73  899  94   BUN 6 - 20 mg/dL 13  11  13    Creatinine 0.61 - 1.24 mg/dL 9.04  9.03  9.09   Sodium 135 - 145 mmol/L 139  137  140   Potassium 3.5 - 5.1 mmol/L 3.8  3.6  4.1   Chloride 98 - 111 mmol/L 102  103  107   CO2 22 - 32 mmol/L 26  22  24     Calcium 8.9 - 10.3 mg/dL 9.6  8.9  9.0   Total Protein 6.5 - 8.1 g/dL 8.0   6.9   Total Bilirubin 0.0 - 1.2 mg/dL 1.6   1.1  Alkaline Phos 38 - 126 U/L 107   41   AST 15 - 41 U/L 31   15   ALT 0 - 44 U/L 23   26     No results found for: MPROTEIN No results found for: KPAFRELGTCHN, LAMBDASER, KAPLAMBRATIO   BLOOD FILM: *** Review of the peripheral blood smear showed normal appearing white cells with neutrophils that were appropriately lobated and granulated. There was no predominance of bi-lobed or hyper-segmented neutrophils appreciated. No Dohle bodies were noted. There was no left shifting, immature forms or blasts noted. Lymphocytes remain normal in size without any predominance of large granular lymphocytes. Red cells show no anisopoikilocytosis, macrocytes , microcytes or polychromasia. There were no schistocytes, target cells, echinocytes, acanthocytes, dacrocytes, or stomatocytes.There was no rouleaux formation, nucleated red cells, or intra-cellular inclusions noted. The platelets are normal in size, shape, and color without any clumping evident.  RADIOGRAPHIC STUDIES: I have personally reviewed the radiological images as listed and agreed with the findings in the report. IR IMAGING GUIDED PORT INSERTION Result Date: 10/23/2024 INDICATION: 34 year old with large cell lymphoma. EXAM: FLUOROSCOPIC AND ULTRASOUND GUIDED PLACEMENT OF A SUBCUTANEOUS PORT MEDICATIONS: Moderate sedation ANESTHESIA/SEDATION: Moderate (conscious) sedation was employed during this procedure. A total of Versed 2 mg and fentanyl 100 mcg was administered intravenously at the order of the provider performing the procedure. Total intra-service moderate sedation time: 26 minutes. Patient's level of consciousness and vital signs were monitored continuously by radiology nurse throughout the procedure under the supervision of the provider performing the procedure. FLUOROSCOPY TIME:  Radiation Exposure Index (as  provided by the fluoroscopic device): 2 mGy Kerma COMPLICATIONS: None immediate. PROCEDURE: The procedure, risks, benefits, and alternatives were explained to the patient. Questions regarding the procedure were encouraged and answered. The patient understands and consents to the procedure. Patient was placed supine on the interventional table. Ultrasound confirmed a patent right internal jugular vein. Ultrasound image was saved for documentation. The right chest and neck were cleaned with a skin antiseptic and a sterile drape was placed. Maximal barrier sterile technique was utilized including caps, mask, sterile gowns, sterile gloves, sterile drape, hand hygiene and skin antiseptic. The right neck was anesthetized with 1% lidocaine . Small incision was made in the right neck with a blade. Micropuncture set was placed in the right internal jugular vein with ultrasound guidance. The micropuncture wire was used for measurement purposes. The right chest was anesthetized with 1% lidocaine  with epinephrine . #15 blade was used to make an incision and a subcutaneous port pocket was formed. 8 french Power Port was assembled. Subcutaneous tunnel was formed with a stiff tunneling device. The port catheter was brought through the subcutaneous tunnel. The port was placed in the subcutaneous pocket. The micropuncture set was exchanged for a peel-away sheath. The catheter was placed through the peel-away sheath and the tip was positioned at the superior cavoatrial junction. Catheter placement was confirmed with fluoroscopy. The port was accessed and flushed with heparinized saline. The port pocket was closed using two layers of absorbable sutures and Dermabond. The vein skin site was closed using a single layer of absorbable suture and Dermabond. Sterile dressings were applied. Patient tolerated the procedure well without an immediate complication. Ultrasound and fluoroscopic images were taken and saved for this procedure.  IMPRESSION: Placement of a subcutaneous power-injectable port device. Catheter tip at the superior cavoatrial junction. Electronically Signed   By: Juliene Balder M.D.   On: 10/23/2024 15:02   ECHOCARDIOGRAM COMPLETE Result Date: 10/18/2024  ECHOCARDIOGRAM REPORT   Patient Name:   ABHISHEK LEVESQUE Date of Exam: 10/18/2024 Medical Rec #:  [id number]      Height:       77.0 in Accession #:    [id number]     Weight:       267.0 lb Date of Birth:  Oct 24, 1991     BSA:          2.529 m Patient Age:    32 years       BP:           122/83 mmHg Patient Gender: M              HR:           96 bpm. Exam Location:  Inpatient Procedure: 2D Echo (Both Spectral and Color Flow Doppler were utilized during            procedure). Indications:    Chemo  History:        Patient has no prior history of Echocardiogram examinations.                 Signs/Symptoms:Chemo.  Sonographer:    Norleen Amour Referring Phys: [id number] IRENE T THAYIL IMPRESSIONS  1. Note that the baseline strain is mildly reduced at -14.9% prior to chemotherapy initiation.  2. Left ventricular ejection fraction, by estimation, is 55 to 60%. Left ventricular ejection fraction by 2D MOD biplane is 55.0 %. The left ventricle has normal function. The left ventricle has no regional wall motion abnormalities. There is mild concentric left ventricular hypertrophy. Left ventricular diastolic parameters are consistent with Grade I diastolic dysfunction (impaired relaxation). The average left ventricular global longitudinal strain is -14.9 %. The global longitudinal strain is abnormal.  3. Right ventricular systolic function is normal. The right ventricular size is normal. Tricuspid regurgitation signal is inadequate for assessing PA pressure.  4. The mitral valve is normal in structure. Trivial mitral valve regurgitation. No evidence of mitral stenosis.  5. The aortic valve is tricuspid. Aortic valve regurgitation is not visualized. No aortic stenosis is present.  6. The  inferior vena cava is normal in size with greater than 50% respiratory variability, suggesting right atrial pressure of 3 mmHg. Comparison(s): No prior Echocardiogram. Conclusion(s)/Recommendation(s): Otherwise normal echocardiogram, with minor abnormalities described in the report. FINDINGS  Left Ventricle: Left ventricular ejection fraction, by estimation, is 55 to 60%. Left ventricular ejection fraction by 2D MOD biplane is 55.0 %. The left ventricle has normal function. The left ventricle has no regional wall motion abnormalities. The average left ventricular global longitudinal strain is -14.9 %. Strain was performed and the global longitudinal strain is abnormal. The left ventricular internal cavity size was normal in size. There is mild concentric left ventricular hypertrophy. Left  ventricular diastolic parameters are consistent with Grade I diastolic dysfunction (impaired relaxation). Right Ventricle: The right ventricular size is normal. No increase in right ventricular wall thickness. Right ventricular systolic function is normal. Tricuspid regurgitation signal is inadequate for assessing PA pressure. Left Atrium: Left atrial size was normal in size. Right Atrium: Right atrial size was normal in size. Pericardium: There is no evidence of pericardial effusion. Mitral Valve: The mitral valve is normal in structure. Trivial mitral valve regurgitation. No evidence of mitral valve stenosis. Tricuspid Valve: The tricuspid valve is normal in structure. Tricuspid valve regurgitation is trivial. No evidence of tricuspid stenosis. Aortic Valve: The aortic valve is tricuspid. Aortic valve regurgitation is not visualized. No aortic stenosis is  present. Pulmonic Valve: The pulmonic valve was normal in structure. Pulmonic valve regurgitation is not visualized. No evidence of pulmonic stenosis. Aorta: The aortic root and ascending aorta are structurally normal, with no evidence of dilitation. Venous: The inferior vena  cava is normal in size with greater than 50% respiratory variability, suggesting right atrial pressure of 3 mmHg. IAS/Shunts: No atrial level shunt detected by color flow Doppler.  LEFT VENTRICLE PLAX 2D                        Biplane EF (MOD) LVIDd:         4.80 cm         LV Biplane EF:   Left LVIDs:         2.70 cm                          ventricular LV PW:         1.10 cm                          ejection LV IVS:        1.10 cm                          fraction by LVOT diam:     2.50 cm                          2D MOD LV SV:         58                               biplane is LV SV Index:   23                               55.0 %. LVOT Area:     4.91 cm LV IVRT:       77 msec         Diastology                                LV e' medial:    5.77 cm/s                                LV E/e' medial:  10.1 LV Volumes (MOD)               LV e' lateral:   6.64 cm/s LV vol d, MOD    157.0 ml      LV E/e' lateral: 8.8 A2C: LV vol d, MOD    144.0 ml      2D Longitudinal A4C:                           Strain LV vol s, MOD    64.1 ml       2D Strain GLS   -14.9 % A2C:                           Avg: LV vol s, MOD  74.1 ml A4C: LV SV MOD A2C:   92.9 ml LV SV MOD A4C:   144.0 ml LV SV MOD BP:    86.1 ml RIGHT VENTRICLE             IVC RV Basal diam:  3.50 cm     IVC diam: 2.00 cm RV S prime:     12.00 cm/s TAPSE (M-mode): 2.5 cm      PULMONARY VEINS                             Diastolic Velocity: 44.80 cm/s                             S/D Velocity:       1.20                             Systolic Velocity:  52.80 cm/s LEFT ATRIUM             Index        RIGHT ATRIUM           Index LA diam:        4.10 cm 1.62 cm/m   RA Area:     19.70 cm LA Vol (A2C):   54.0 ml 21.35 ml/m  RA Volume:   52.70 ml  20.84 ml/m LA Vol (A4C):   42.2 ml 16.69 ml/m LA Biplane Vol: 47.8 ml 18.90 ml/m  AORTIC VALVE             PULMONIC VALVE LVOT Vmax:   78.20 cm/s  PV Vmax:       0.94 m/s LVOT Vmean:  50.800 cm/s PV Peak grad:  3.6 mmHg  LVOT VTI:    0.119 m  AORTA Ao Root diam: 3.80 cm Ao Asc diam:  3.40 cm MITRAL VALVE MV Area (PHT): 3.74 cm    SHUNTS MV Decel Time: 203 msec    Systemic VTI:  0.12 m MV E velocity: 58.20 cm/s  Systemic Diam: 2.50 cm MV A velocity: 82.30 cm/s MV E/A ratio:  0.71 Georganna Archer Electronically signed by Georganna Archer Signature Date/Time: 10/18/2024/3:10:14 PM    Final    NM PET Image Initial (PI) Skull Base To Thigh Result Date: 10/17/2024 EXAM: PET AND CT SKULL BASE TO MID THIGH 10/17/2024 05:14:42 PM TECHNIQUE: RADIOPHARMACEUTICAL: 13.31 mCi F-18 FDG Uptake time 60 minutes. Glucose level 98 mg/dl. Blood pool SUV 2.4; hepatic parenchyma SUV 3.6. PET imaging was acquired from the base of the skull to the mid thighs. Non-contrast enhanced computed tomography was obtained for attenuation correction and anatomic localization. COMPARISON: None available. CLINICAL HISTORY: Newly diagnosed lymphoma, staging. FINDINGS: HEAD AND NECK: Nonspecific posterior nasopharyngeal activity with maximum SUV 7.3. Mildly asymmetric palatine tonsillar activity with maximum SUV on the left at 7.1 and on the right at 4.8. No metabolically active cervical lymphadenopathy. CHEST: 0.8 cm right axillary lymph node on image 83 series 4 has a maximum SUV of 11.2, L5. Out metabolic activity, maximum SUV 3.9, this is a relatively non masslike configuration and is stable on the CT data from 03/10/2015, favoring thymic tissue over lymphoma involvement. No metabolically active pulmonary nodules. ABDOMEN AND PELVIS: Hypermetabolic retroperitoneal, mesenteric, right external iliac, and right inguinal adenopathy. Index small lymph node adjacent to the SMA origin with maximum SUV 6.5, L4. Index left common iliac  node 0.9 cm in short axis on image 172 series 4 with maximum SUV 18.3, L5. Bulky right inguinal adenopathy including a lower inguinal node measuring 2.7 cm in short axis on image 250 series 4 with maximum SUV 24.3, L5. Low grade stranding  in the retroperitoneum and along the right pelvic sidewall and right groin region. No focal accentuated splenic activity identified. The spleen measures 12.5 x 6.8 x 12.0 cm (530 cc) compatible with mild splenomegaly. Physiologic activity within the gastrointestinal and genitourinary systems. BONES AND SOFT TISSUE: No abnormal FDG activity localizes to the bones. No metabolically active aggressive osseous lesion. IMPRESSION: 1. Hypermetabolic retroperitoneal, mesenteric, right external iliac, and right inguinal adenopathy, consistent with lymphoma involvement. 2. Hypermetabolic right axillary lymph node measuring 0.8 cm with maximum SUV 11.2, consistent with nodal involvement. 3. Nonspecific posterior nasopharyngeal hypermetabolism and mildly asymmetric palatine tonsillar activity, favor benign physiologic activity over neoplastic involvement. 4. Mild splenomegaly without focal accentuated splenic activity. Electronically signed by: Ryan Salvage MD 10/17/2024 05:43 PM EDT RP Workstation: [id number]   US  CORE BIOPSY (LYMPH NODES) Result Date: 10/10/2024 INDICATION: Right inguinal, iliac and para-aortic lymphadenopathy. The patient presents for right inguinal lymph node biopsy. EXAM: ULTRASOUND GUIDED CORE BIOPSY OF RIGHT INGUINAL LYMPH NODE MEDICATIONS: None. ANESTHESIA/SEDATION: None PROCEDURE: The procedure, risks, benefits, and alternatives were explained to the patient. Questions regarding the procedure were encouraged and answered. The patient understands and consents to the procedure. A time out was performed prior to initiating the procedure. The right groin region was prepped with chlorhexidine in a sterile fashion, and a sterile drape was applied covering the operative field. A sterile gown and sterile gloves were used for the procedure. Local anesthesia was provided with 1% Lidocaine . Enlarged right inguinal lymph nodes were localized by ultrasound. A 16 gauge core biopsy device was utilized in  obtaining 5 core biopsy samples adjacent enlarged lymph nodes in the anterior right inguinal region. Post biopsy ultrasound images were obtained. COMPLICATIONS: None immediate. FINDINGS: Multitude of enlarged right inguinal lymph nodes are identified. Two adjacent anterior nodes each measuring up to approximately 3.4 cm in maximal diameter were targeted with multiple samples obtained in both lymph nodes. IMPRESSION: Ultrasound-guided core biopsy performed of enlarged right inguinal lymph nodes. Electronically Signed   By: Marcey Moan M.D.   On: 10/10/2024 15:55   CT ABDOMEN PELVIS W CONTRAST Result Date: 09/26/2024 CLINICAL DATA:  Groin swelling. EXAM: CT ABDOMEN AND PELVIS WITH CONTRAST TECHNIQUE: Multidetector CT imaging of the abdomen and pelvis was performed using the standard protocol following bolus administration of intravenous contrast. RADIATION DOSE REDUCTION: This exam was performed according to the departmental dose-optimization program which includes automated exposure control, adjustment of the mA and/or kV according to patient size and/or use of iterative reconstruction technique. CONTRAST:  100mL OMNIPAQUE IOHEXOL 350 MG/ML SOLN COMPARISON:  None Available. FINDINGS: Lower chest: No acute abnormality. Hepatobiliary: No focal liver abnormality is seen. No gallstones, gallbladder wall thickening, or biliary dilatation. Pancreas: Unremarkable. No pancreatic ductal dilatation or surrounding inflammatory changes. Spleen: Normal in size without focal abnormality. Adrenals/Urinary Tract: Adrenal glands are unremarkable. Kidneys are normal, without renal calculi, focal lesion, or hydronephrosis. Bladder is unremarkable. Stomach/Bowel: Stomach is within normal limits. Appendix appears normal. No evidence of bowel wall thickening, distention, or inflammatory changes. Vascular/Lymphatic: Extensive adenopathy is seen involving the right inguinal, iliac and periaortic regions, which may be malignant or  inflammatory in etiology. Tissue sampling is recommended. Largest lymph node measures 6.8 x 2.6 cm inferior right inguinal region.  3.0 x 2.4 cm right external iliac lymph node is noted. 4.3 x 1.4 cm right periaortic lymph node is noted. Reproductive: Prostate is unremarkable. Other: No ascites or hernia is noted. Musculoskeletal: No acute or significant osseous findings. IMPRESSION: Extensive adenopathy is seen involving the right inguinal, iliac and periaortic regions, which may be malignant or inflammatory in etiology. Tissue sampling is recommended. Electronically Signed   By: Lynwood Landy Raddle M.D.   On: 09/26/2024 15:15    ASSESSMENT & PLAN ***  No orders of the defined types were placed in this encounter.   All questions were answered. The patient knows to call the clinic with any problems, questions or concerns.  A total of more than 30 minutes were spent on this encounter with face-to-face time and non-face-to-face time, including preparing to see the patient, ordering tests and/or medications, counseling the patient and coordination of care as outlined above.   Norleen IVAR Kidney, MD Department of Hematology/Oncology [facility] Phone: [phone] Pager: [phone] Email: [email or web address]  10/24/2024 8:33 PM

## 2024-10-25 ENCOUNTER — Inpatient Hospital Stay: Payer: Self-pay | Admitting: Physician Assistant

## 2024-10-25 ENCOUNTER — Inpatient Hospital Stay: Payer: Self-pay

## 2024-10-25 ENCOUNTER — Telehealth: Payer: Self-pay | Admitting: Hematology and Oncology

## 2024-10-25 ENCOUNTER — Inpatient Hospital Stay: Payer: Self-pay | Admitting: Hematology and Oncology

## 2024-10-25 NOTE — Progress Notes (Signed)
 Called patient d/t having to reschedule his appointment today. Patient got reminder in MyChart this morning and was unable to make appointment and has rescheduled. When I called he also told me that he needs help with transportation and needs to talk to a social worker about finances. I told patient I would contact the social worker and have them call him and Sherlean from transportation would call him also.

## 2024-10-28 NOTE — Progress Notes (Unsigned)
 [facility] [phone]   [phone]  PROGRESS NOTE  Patient Care Team: Patient, No Pcp Per as PCP - General (General Practice) Elana Montie CROME, RN as Oncology Nurse Navigator  Hematological/Oncological History # Anaplastic T Cell Lymphoma, Alk Positive. Stage IV  09/26/2024: CT scan performed for groin swelling shows extensive adenopathy in the right inguinal, iliac, and in the peri-aortic regions 09/27/2024: Establish care with the rapid diagnostic clinic. 10/10/2024: US -guided core biopsy showed ALK positive anaplastic large cell lymphoma 10/17/2024: PET CT scan shows extensive abdominal lymphadenopathy with axillary lymph node involvement as well.  Confirms at least stage III disease.  Interval History:  Russell [name] 33 y.o. male with medical history significant for newly diagnosed Anaplastic T Cell lymphoma (ALK positive) who presents for a follow up visit. The patient's last visit was on 09/27/2024 at which time he established care. In the interim since the last visit he underwent port placement and PET CT scan and presents today for further evaluation management.  On exam today Russell [name] is accompanied by his ex-girlfriend who he has a child with.  He reports that he has had no major changes in his health in interim since her last visit.  He reports that the port is causing him some discomfort, predominantly in his neck.  He reports that he has not had any other major changes in his symptoms, though his weight continues to decline.  He is down to 256 pounds, from 267 pounds at our last visit.  He reports that the lymph nodes do cause him pain in his groin area.  Otherwise he has been at his baseline level of health.  The bulk of our discussion focused on the results of the PET CT scan and steps moving forward.  We discussed the chemotherapy regimen and plan for treatment.  He voices understanding of our plan moving forward.   MEDICAL HISTORY:   Past Medical History:  Diagnosis Date   Allergy    Anxiety    Asthma    Asthma 01/13/2023   Depression    Left elbow pain 01/13/2023   Elbow > shoulder Radiates to numbing sensation in pinky and ring finger. Not lifting heavy stuff   Obesity     SURGICAL HISTORY: Past Surgical History:  Procedure Laterality Date   IR IMAGING GUIDED PORT INSERTION  10/23/2024   TONSILLECTOMY     TYMPANOSTOMY TUBE PLACEMENT      SOCIAL HISTORY: Social History   Socioeconomic History   Marital status: Significant Other    Spouse name: Not on file   Number of children: Not on file   Years of education: Not on file   Highest education level: Not on file  Occupational History   Not on file  Tobacco Use   Smoking status: Never   Smokeless tobacco: Current    Types: Chew  Vaping Use   Vaping status: Some Days  Substance and Sexual Activity   Alcohol use: Not Currently   Drug use: Yes    Frequency: 7.0 times per week    Types: Marijuana    Comment: smoke marijuana   Sexual activity: Yes  Other Topics Concern   Not on file  Social History Narrative   Not on file   Social Drivers of Health   Financial Resource Strain: Not on file  Food Insecurity: Not on file  Transportation Needs: Not on file  Physical Activity: Not on file  Stress: Not on file  Social Connections: Not  on file  Intimate Partner Violence: Not on file    FAMILY HISTORY: Family History  Problem Relation Age of Onset   Hyperlipidemia Mother    Early death Mother    Hypertension Mother    Mental illness Mother    Alcohol abuse Father    Asthma Father    COPD Father    Drug abuse Father    Early death Father    Hypertension Father    Hyperlipidemia Father    Stroke Father    Heart attack Father    Cancer Maternal Grandmother    Heart disease Maternal Grandfather    Skin cancer Maternal Grandfather    Mental retardation Maternal Uncle     ALLERGIES:  is allergic to penicillins.  MEDICATIONS:   Current Outpatient Medications  Medication Sig Dispense Refill   albuterol  (VENTOLIN  HFA) 108 (90 Base) MCG/ACT inhaler Inhale 1-2 puffs into the lungs every 6 (six) hours as needed. 18 g 2   allopurinol (ZYLOPRIM) 300 MG tablet Take 1 tablet (300 mg total) by mouth daily. 90 tablet 1   cetirizine  (ZYRTEC ) 10 MG tablet Take 1 tablet (10 mg total) by mouth daily. 30 tablet 1   doxycycline  (VIBRAMYCIN ) 100 MG capsule Take 1 capsule (100 mg total) by mouth 2 (two) times daily. (Patient not taking: Reported on 10/29/2024) 20 capsule 0   fluticasone  (FLONASE ) 50 MCG/ACT nasal spray Place 2 sprays into both nostrils daily. 16 g 0   lidocaine -prilocaine (EMLA) cream Apply 1 Application topically as needed. 30 g 0   mometasone -formoterol  (DULERA ) 200-5 MCG/ACT AERO Inhale 2 puffs into the lungs 2 (two) times daily. 13 g 0   ondansetron  (ZOFRAN ) 8 MG tablet Take 1 tablet (8 mg total) by mouth every 8 (eight) hours as needed. 30 tablet 0   ondansetron  (ZOFRAN -ODT) 4 MG disintegrating tablet Take 1 tablet (4 mg total) by mouth every 8 (eight) hours as needed for nausea or vomiting. (Patient not taking: Reported on 10/29/2024) 20 tablet 0   oxyCODONE (ROXICODONE) 5 MG immediate release tablet Take 1 tablet (5 mg total) by mouth every 6 (six) hours as needed for severe pain (pain score 7-10). (Patient not taking: Reported on 10/29/2024) 10 tablet 0   predniSONE  (DELTASONE ) 20 MG tablet Take 3 tablets (60 mg total) by mouth daily with breakfast. Take 3 pills on the first day of chemotherapy and continue for total of 5 days.  Repeat each cycle. 15 tablet 5   prochlorperazine (COMPAZINE) 10 MG tablet Take 1 tablet (10 mg total) by mouth every 6 (six) hours as needed for nausea or vomiting. 30 tablet 0   No current facility-administered medications for this visit.    REVIEW OF SYSTEMS:   Constitutional: ( - ) fevers, ( - )  chills , ( - ) night sweats Eyes: ( - ) blurriness of vision, ( - ) double vision, ( - )  watery eyes Ears, nose, mouth, throat, and face: ( - ) mucositis, ( - ) sore throat Respiratory: ( - ) cough, ( - ) dyspnea, ( - ) wheezes Cardiovascular: ( - ) palpitation, ( - ) chest discomfort, ( - ) lower extremity swelling Gastrointestinal:  ( - ) nausea, ( - ) heartburn, ( - ) change in bowel habits Skin: ( - ) abnormal skin rashes Lymphatics: ( - ) new lymphadenopathy, ( - ) easy bruising Neurological: ( - ) numbness, ( - ) tingling, ( - ) new weaknesses Behavioral/Psych: ( - ) mood change, ( - )  new changes  All other systems were reviewed with the patient and are negative.  PHYSICAL EXAMINATION: ECOG PERFORMANCE STATUS: 1 - Symptomatic but completely ambulatory  Vitals:   10/29/24 1042 10/29/24 1044  BP: (!) 150/103 (!) 148/90  Pulse: 90   Resp: 16   Temp: 98.1 F (36.7 C)   SpO2: 99%    Filed Weights   10/29/24 1042  Weight: 256 lb 11.2 oz (116.4 kg)    GENERAL: Well-appearing middle-age Caucasian male, alert, no distress and comfortable SKIN: skin color, texture, turgor are normal, no rashes or significant lesions EYES: conjunctiva are pink and non-injected, sclera clear LUNGS: clear to auscultation and percussion with normal breathing effort HEART: regular rate & rhythm and no murmurs and no lower extremity edema Musculoskeletal: no cyanosis of digits and no clubbing  PSYCH: alert & oriented x 3, fluent speech NEURO: no focal motor/sensory deficits  LABORATORY DATA:  I have reviewed the data as listed    Latest Ref Rng & Units 10/29/2024   10:13 AM 09/27/2024    3:43 PM 09/26/2024   10:13 AM  CBC  WBC 4.0 - 10.5 K/uL 11.1  12.8  12.5   Hemoglobin 13.0 - 17.0 g/dL 85.7  85.0  85.5   Hematocrit 39.0 - 52.0 % 40.1  41.1  41.6   Platelets 150 - 400 K/uL 263  309  308        Latest Ref Rng & Units 10/29/2024   10:13 AM 09/27/2024    3:43 PM 09/26/2024   10:13 AM  CMP  Glucose 70 - 99 mg/dL 897  73  899   BUN 6 - 20 mg/dL 11  13  11    Creatinine 0.61 -  1.24 mg/dL 9.19  9.04  9.03   Sodium 135 - 145 mmol/L 143  139  137   Potassium 3.5 - 5.1 mmol/L 3.8  3.8  3.6   Chloride 98 - 111 mmol/L 109  102  103   CO2 22 - 32 mmol/L 25  26  22    Calcium 8.9 - 10.3 mg/dL 9.6  9.6  8.9   Total Protein 6.5 - 8.1 g/dL 7.2  8.0    Total Bilirubin 0.0 - 1.2 mg/dL 0.7  1.6    Alkaline Phos 38 - 126 U/L 61  107    AST 15 - 41 U/L 10  31    ALT 0 - 44 U/L 9  23      RADIOGRAPHIC STUDIES: IR IMAGING GUIDED PORT INSERTION Result Date: 10/23/2024 INDICATION: 33 year old with large cell lymphoma. EXAM: FLUOROSCOPIC AND ULTRASOUND GUIDED PLACEMENT OF A SUBCUTANEOUS PORT MEDICATIONS: Moderate sedation ANESTHESIA/SEDATION: Moderate (conscious) sedation was employed during this procedure. A total of Versed 2 mg and fentanyl 100 mcg was administered intravenously at the order of the provider performing the procedure. Total intra-service moderate sedation time: 26 minutes. Patient's level of consciousness and vital signs were monitored continuously by radiology nurse throughout the procedure under the supervision of the provider performing the procedure. FLUOROSCOPY TIME:  Radiation Exposure Index (as provided by the fluoroscopic device): 2 mGy Kerma COMPLICATIONS: None immediate. PROCEDURE: The procedure, risks, benefits, and alternatives were explained to the patient. Questions regarding the procedure were encouraged and answered. The patient understands and consents to the procedure. Patient was placed supine on the interventional table. Ultrasound confirmed a patent right internal jugular vein. Ultrasound image was saved for documentation. The right chest and neck were cleaned with a skin antiseptic and a sterile drape  was placed. Maximal barrier sterile technique was utilized including caps, mask, sterile gowns, sterile gloves, sterile drape, hand hygiene and skin antiseptic. The right neck was anesthetized with 1% lidocaine . Small incision was made in the right neck with a  blade. Micropuncture set was placed in the right internal jugular vein with ultrasound guidance. The micropuncture wire was used for measurement purposes. The right chest was anesthetized with 1% lidocaine  with epinephrine . #15 blade was used to make an incision and a subcutaneous port pocket was formed. 8 french Power Port was assembled. Subcutaneous tunnel was formed with a stiff tunneling device. The port catheter was brought through the subcutaneous tunnel. The port was placed in the subcutaneous pocket. The micropuncture set was exchanged for a peel-away sheath. The catheter was placed through the peel-away sheath and the tip was positioned at the superior cavoatrial junction. Catheter placement was confirmed with fluoroscopy. The port was accessed and flushed with heparinized saline. The port pocket was closed using two layers of absorbable sutures and Dermabond. The vein skin site was closed using a single layer of absorbable suture and Dermabond. Sterile dressings were applied. Patient tolerated the procedure well without an immediate complication. Ultrasound and fluoroscopic images were taken and saved for this procedure. IMPRESSION: Placement of a subcutaneous power-injectable port device. Catheter tip at the superior cavoatrial junction. Electronically Signed   By: Juliene Balder M.D.   On: 10/23/2024 15:02   ECHOCARDIOGRAM COMPLETE Result Date: 10/18/2024    ECHOCARDIOGRAM REPORT   Patient Name:   Russell [name] Date of Exam: 10/18/2024 Medical Rec #:  [id number]      Height:       77.0 in Accession #:    [id number]     Weight:       267.0 lb Date of Birth:  August 11, 1991     BSA:          2.529 m Patient Age:    32 years       BP:           122/83 mmHg Patient Gender: M              HR:           96 bpm. Exam Location:  Inpatient Procedure: 2D Echo (Both Spectral and Color Flow Doppler were utilized during            procedure). Indications:    Chemo  History:        Patient has no prior history of  Echocardiogram examinations.                 Signs/Symptoms:Chemo.  Sonographer:    Norleen Amour Referring Phys: [id number] IRENE T THAYIL IMPRESSIONS  1. Note that the baseline strain is mildly reduced at -14.9% prior to chemotherapy initiation.  2. Left ventricular ejection fraction, by estimation, is 55 to 60%. Left ventricular ejection fraction by 2D MOD biplane is 55.0 %. The left ventricle has normal function. The left ventricle has no regional wall motion abnormalities. There is mild concentric left ventricular hypertrophy. Left ventricular diastolic parameters are consistent with Grade I diastolic dysfunction (impaired relaxation). The average left ventricular global longitudinal strain is -14.9 %. The global longitudinal strain is abnormal.  3. Right ventricular systolic function is normal. The right ventricular size is normal. Tricuspid regurgitation signal is inadequate for assessing PA pressure.  4. The mitral valve is normal in structure. Trivial mitral valve regurgitation. No evidence of mitral stenosis.  5. The aortic valve  is tricuspid. Aortic valve regurgitation is not visualized. No aortic stenosis is present.  6. The inferior vena cava is normal in size with greater than 50% respiratory variability, suggesting right atrial pressure of 3 mmHg. Comparison(s): No prior Echocardiogram. Conclusion(s)/Recommendation(s): Otherwise normal echocardiogram, with minor abnormalities described in the report. FINDINGS  Left Ventricle: Left ventricular ejection fraction, by estimation, is 55 to 60%. Left ventricular ejection fraction by 2D MOD biplane is 55.0 %. The left ventricle has normal function. The left ventricle has no regional wall motion abnormalities. The average left ventricular global longitudinal strain is -14.9 %. Strain was performed and the global longitudinal strain is abnormal. The left ventricular internal cavity size was normal in size. There is mild concentric left ventricular hypertrophy.  Left  ventricular diastolic parameters are consistent with Grade I diastolic dysfunction (impaired relaxation). Right Ventricle: The right ventricular size is normal. No increase in right ventricular wall thickness. Right ventricular systolic function is normal. Tricuspid regurgitation signal is inadequate for assessing PA pressure. Left Atrium: Left atrial size was normal in size. Right Atrium: Right atrial size was normal in size. Pericardium: There is no evidence of pericardial effusion. Mitral Valve: The mitral valve is normal in structure. Trivial mitral valve regurgitation. No evidence of mitral valve stenosis. Tricuspid Valve: The tricuspid valve is normal in structure. Tricuspid valve regurgitation is trivial. No evidence of tricuspid stenosis. Aortic Valve: The aortic valve is tricuspid. Aortic valve regurgitation is not visualized. No aortic stenosis is present. Pulmonic Valve: The pulmonic valve was normal in structure. Pulmonic valve regurgitation is not visualized. No evidence of pulmonic stenosis. Aorta: The aortic root and ascending aorta are structurally normal, with no evidence of dilitation. Venous: The inferior vena cava is normal in size with greater than 50% respiratory variability, suggesting right atrial pressure of 3 mmHg. IAS/Shunts: No atrial level shunt detected by color flow Doppler.  LEFT VENTRICLE PLAX 2D                        Biplane EF (MOD) LVIDd:         4.80 cm         LV Biplane EF:   Left LVIDs:         2.70 cm                          ventricular LV PW:         1.10 cm                          ejection LV IVS:        1.10 cm                          fraction by LVOT diam:     2.50 cm                          2D MOD LV SV:         58                               biplane is LV SV Index:   23  55.0 %. LVOT Area:     4.91 cm LV IVRT:       77 msec         Diastology                                LV e' medial:    5.77 cm/s                                 LV E/e' medial:  10.1 LV Volumes (MOD)               LV e' lateral:   6.64 cm/s LV vol d, MOD    157.0 ml      LV E/e' lateral: 8.8 A2C: LV vol d, MOD    144.0 ml      2D Longitudinal A4C:                           Strain LV vol s, MOD    64.1 ml       2D Strain GLS   -14.9 % A2C:                           Avg: LV vol s, MOD    74.1 ml A4C: LV SV MOD A2C:   92.9 ml LV SV MOD A4C:   144.0 ml LV SV MOD BP:    86.1 ml RIGHT VENTRICLE             IVC RV Basal diam:  3.50 cm     IVC diam: 2.00 cm RV S prime:     12.00 cm/s TAPSE (M-mode): 2.5 cm      PULMONARY VEINS                             Diastolic Velocity: 44.80 cm/s                             S/D Velocity:       1.20                             Systolic Velocity:  52.80 cm/s LEFT ATRIUM             Index        RIGHT ATRIUM           Index LA diam:        4.10 cm 1.62 cm/m   RA Area:     19.70 cm LA Vol (A2C):   54.0 ml 21.35 ml/m  RA Volume:   52.70 ml  20.84 ml/m LA Vol (A4C):   42.2 ml 16.69 ml/m LA Biplane Vol: 47.8 ml 18.90 ml/m  AORTIC VALVE             PULMONIC VALVE LVOT Vmax:   78.20 cm/s  PV Vmax:       0.94 m/s LVOT Vmean:  50.800 cm/s PV Peak grad:  3.6 mmHg LVOT VTI:    0.119 m  AORTA Ao Root diam: 3.80 cm Ao Asc diam:  3.40 cm MITRAL VALVE MV Area (PHT): 3.74 cm    SHUNTS MV Decel Time:  203 msec    Systemic VTI:  0.12 m MV E velocity: 58.20 cm/s  Systemic Diam: 2.50 cm MV A velocity: 82.30 cm/s MV E/A ratio:  0.71 Georganna Archer Electronically signed by Georganna Archer Signature Date/Time: 10/18/2024/3:10:14 PM    Final    NM PET Image Initial (PI) Skull Base To Thigh Result Date: 10/17/2024 EXAM: PET AND CT SKULL BASE TO MID THIGH 10/17/2024 05:14:42 PM TECHNIQUE: RADIOPHARMACEUTICAL: 13.31 mCi F-18 FDG Uptake time 60 minutes. Glucose level 98 mg/dl. Blood pool SUV 2.4; hepatic parenchyma SUV 3.6. PET imaging was acquired from the base of the skull to the mid thighs. Non-contrast enhanced computed tomography was obtained for  attenuation correction and anatomic localization. COMPARISON: None available. CLINICAL HISTORY: Newly diagnosed lymphoma, staging. FINDINGS: HEAD AND NECK: Nonspecific posterior nasopharyngeal activity with maximum SUV 7.3. Mildly asymmetric palatine tonsillar activity with maximum SUV on the left at 7.1 and on the right at 4.8. No metabolically active cervical lymphadenopathy. CHEST: 0.8 cm right axillary lymph node on image 83 series 4 has a maximum SUV of 11.2, L5. Out metabolic activity, maximum SUV 3.9, this is a relatively non masslike configuration and is stable on the CT data from 03/10/2015, favoring thymic tissue over lymphoma involvement. No metabolically active pulmonary nodules. ABDOMEN AND PELVIS: Hypermetabolic retroperitoneal, mesenteric, right external iliac, and right inguinal adenopathy. Index small lymph node adjacent to the SMA origin with maximum SUV 6.5, L4. Index left common iliac node 0.9 cm in short axis on image 172 series 4 with maximum SUV 18.3, L5. Bulky right inguinal adenopathy including a lower inguinal node measuring 2.7 cm in short axis on image 250 series 4 with maximum SUV 24.3, L5. Low grade stranding in the retroperitoneum and along the right pelvic sidewall and right groin region. No focal accentuated splenic activity identified. The spleen measures 12.5 x 6.8 x 12.0 cm (530 cc) compatible with mild splenomegaly. Physiologic activity within the gastrointestinal and genitourinary systems. BONES AND SOFT TISSUE: No abnormal FDG activity localizes to the bones. No metabolically active aggressive osseous lesion. IMPRESSION: 1. Hypermetabolic retroperitoneal, mesenteric, right external iliac, and right inguinal adenopathy, consistent with lymphoma involvement. 2. Hypermetabolic right axillary lymph node measuring 0.8 cm with maximum SUV 11.2, consistent with nodal involvement. 3. Nonspecific posterior nasopharyngeal hypermetabolism and mildly asymmetric palatine tonsillar activity,  favor benign physiologic activity over neoplastic involvement. 4. Mild splenomegaly without focal accentuated splenic activity. Electronically signed by: Ryan Salvage MD 10/17/2024 05:43 PM EDT RP Workstation: [id number]   US  CORE BIOPSY (LYMPH NODES) Result Date: 10/10/2024 INDICATION: Right inguinal, iliac and para-aortic lymphadenopathy. The patient presents for right inguinal lymph node biopsy. EXAM: ULTRASOUND GUIDED CORE BIOPSY OF RIGHT INGUINAL LYMPH NODE MEDICATIONS: None. ANESTHESIA/SEDATION: None PROCEDURE: The procedure, risks, benefits, and alternatives were explained to the patient. Questions regarding the procedure were encouraged and answered. The patient understands and consents to the procedure. A time out was performed prior to initiating the procedure. The right groin region was prepped with chlorhexidine in a sterile fashion, and a sterile drape was applied covering the operative field. A sterile gown and sterile gloves were used for the procedure. Local anesthesia was provided with 1% Lidocaine . Enlarged right inguinal lymph nodes were localized by ultrasound. A 16 gauge core biopsy device was utilized in obtaining 5 core biopsy samples adjacent enlarged lymph nodes in the anterior right inguinal region. Post biopsy ultrasound images were obtained. COMPLICATIONS: None immediate. FINDINGS: Multitude of enlarged right inguinal lymph nodes are identified. Two adjacent anterior nodes  each measuring up to approximately 3.4 cm in maximal diameter were targeted with multiple samples obtained in both lymph nodes. IMPRESSION: Ultrasound-guided core biopsy performed of enlarged right inguinal lymph nodes. Electronically Signed   By: Marcey Moan M.D.   On: 10/10/2024 15:55    ASSESSMENT & PLAN Russell [name] 33 y.o. male with medical history significant for newly diagnosed Anaplastic T Cell lymphoma (ALK positive) who presents for a follow up visit.  # Anaplastic T Cell Lymphoma, Alk  Positive. Stage III  -- Diagnosis confirmed based on biopsy, PET CT scan confirms at least stage III disease -- Biopsy confirms anaplastic T-cell lymphoma with ALK positivity --Will plan to proceed with brentuximab CHP treatment.  This will be administered every 3 weeks for total of 6 cycles -- Will plan for interval PET CT scan after cycle 3 -- Discussed with patient we will plan to start on 11/06/2024. -- Supportive therapy as noted below. -- Return to clinic for the start of treatment.    #Supportive Care -- chemotherapy education to be scheduled  -- port placed -- zofran  8mg  q8H PRN and compazine 10mg  PO q6H for nausea -- allopurinol 300mg  PO daily for TLS prophylaxis -- EMLA cream for port -- no pain medication required at this time.    Orders Placed This Encounter  Procedures   CBC with Differential (Cancer Center Only)    Standing Status:   Future    Expected Date:   11/06/2024    Expiration Date:   11/06/2025   CMP (Cancer Center only)    Standing Status:   Future    Expected Date:   11/06/2024    Expiration Date:   11/06/2025    All questions were answered. The patient knows to call the clinic with any problems, questions or concerns.  A total of more than 30 minutes were spent on this encounter with face-to-face time and non-face-to-face time, including preparing to see the patient, ordering tests and/or medications, counseling the patient and coordination of care as outlined above.   Norleen IVAR Kidney, MD Department of Hematology/Oncology [facility] Phone: [phone] Pager: [phone] Email: [email or web address]  10/29/2024 2:57 PM

## 2024-10-29 ENCOUNTER — Encounter: Payer: Self-pay | Admitting: Licensed Clinical Social Worker

## 2024-10-29 ENCOUNTER — Inpatient Hospital Stay (HOSPITAL_BASED_OUTPATIENT_CLINIC_OR_DEPARTMENT_OTHER): Payer: Self-pay | Admitting: Hematology and Oncology

## 2024-10-29 ENCOUNTER — Inpatient Hospital Stay: Payer: Self-pay | Attending: Physician Assistant

## 2024-10-29 ENCOUNTER — Other Ambulatory Visit (HOSPITAL_COMMUNITY): Payer: Self-pay

## 2024-10-29 ENCOUNTER — Encounter: Payer: Self-pay | Admitting: Hematology and Oncology

## 2024-10-29 ENCOUNTER — Inpatient Hospital Stay: Payer: Self-pay | Admitting: Licensed Clinical Social Worker

## 2024-10-29 VITALS — BP 148/90 | HR 90 | Temp 98.1°F | Resp 16 | Wt 256.7 lb

## 2024-10-29 DIAGNOSIS — R591 Generalized enlarged lymph nodes: Secondary | ICD-10-CM

## 2024-10-29 DIAGNOSIS — F419 Anxiety disorder, unspecified: Secondary | ICD-10-CM | POA: Insufficient documentation

## 2024-10-29 DIAGNOSIS — Z811 Family history of alcohol abuse and dependence: Secondary | ICD-10-CM | POA: Insufficient documentation

## 2024-10-29 DIAGNOSIS — Z9089 Acquired absence of other organs: Secondary | ICD-10-CM | POA: Insufficient documentation

## 2024-10-29 DIAGNOSIS — Z813 Family history of other psychoactive substance abuse and dependence: Secondary | ICD-10-CM | POA: Insufficient documentation

## 2024-10-29 DIAGNOSIS — Z8249 Family history of ischemic heart disease and other diseases of the circulatory system: Secondary | ICD-10-CM | POA: Insufficient documentation

## 2024-10-29 DIAGNOSIS — Z808 Family history of malignant neoplasm of other organs or systems: Secondary | ICD-10-CM | POA: Insufficient documentation

## 2024-10-29 DIAGNOSIS — F32A Depression, unspecified: Secondary | ICD-10-CM | POA: Insufficient documentation

## 2024-10-29 DIAGNOSIS — Z5971 Insufficient health insurance coverage: Secondary | ICD-10-CM | POA: Insufficient documentation

## 2024-10-29 DIAGNOSIS — Z83438 Family history of other disorder of lipoprotein metabolism and other lipidemia: Secondary | ICD-10-CM | POA: Insufficient documentation

## 2024-10-29 DIAGNOSIS — Z9221 Personal history of antineoplastic chemotherapy: Secondary | ICD-10-CM | POA: Insufficient documentation

## 2024-10-29 DIAGNOSIS — F17290 Nicotine dependence, other tobacco product, uncomplicated: Secondary | ICD-10-CM | POA: Insufficient documentation

## 2024-10-29 DIAGNOSIS — Z825 Family history of asthma and other chronic lower respiratory diseases: Secondary | ICD-10-CM | POA: Insufficient documentation

## 2024-10-29 DIAGNOSIS — Z810 Family history of intellectual disabilities: Secondary | ICD-10-CM | POA: Insufficient documentation

## 2024-10-29 DIAGNOSIS — F1290 Cannabis use, unspecified, uncomplicated: Secondary | ICD-10-CM | POA: Insufficient documentation

## 2024-10-29 DIAGNOSIS — D45 Polycythemia vera: Secondary | ICD-10-CM | POA: Insufficient documentation

## 2024-10-29 DIAGNOSIS — R590 Localized enlarged lymph nodes: Secondary | ICD-10-CM | POA: Insufficient documentation

## 2024-10-29 DIAGNOSIS — F17220 Nicotine dependence, chewing tobacco, uncomplicated: Secondary | ICD-10-CM | POA: Insufficient documentation

## 2024-10-29 DIAGNOSIS — C8586 Other specified types of non-Hodgkin lymphoma, intrapelvic lymph nodes: Secondary | ICD-10-CM

## 2024-10-29 DIAGNOSIS — Z880 Allergy status to penicillin: Secondary | ICD-10-CM | POA: Insufficient documentation

## 2024-10-29 DIAGNOSIS — Z823 Family history of stroke: Secondary | ICD-10-CM | POA: Insufficient documentation

## 2024-10-29 DIAGNOSIS — R5383 Other fatigue: Secondary | ICD-10-CM | POA: Insufficient documentation

## 2024-10-29 DIAGNOSIS — Z818 Family history of other mental and behavioral disorders: Secondary | ICD-10-CM | POA: Insufficient documentation

## 2024-10-29 DIAGNOSIS — C8460 Anaplastic large cell lymphoma, ALK-positive, unspecified site: Secondary | ICD-10-CM | POA: Insufficient documentation

## 2024-10-29 DIAGNOSIS — Z79899 Other long term (current) drug therapy: Secondary | ICD-10-CM | POA: Insufficient documentation

## 2024-10-29 DIAGNOSIS — Z809 Family history of malignant neoplasm, unspecified: Secondary | ICD-10-CM | POA: Insufficient documentation

## 2024-10-29 DIAGNOSIS — J45909 Unspecified asthma, uncomplicated: Secondary | ICD-10-CM | POA: Insufficient documentation

## 2024-10-29 DIAGNOSIS — Z7952 Long term (current) use of systemic steroids: Secondary | ICD-10-CM | POA: Insufficient documentation

## 2024-10-29 LAB — CBC WITH DIFFERENTIAL (CANCER CENTER ONLY)
Abs Immature Granulocytes: 0.06 K/uL (ref 0.00–0.07)
Basophils Absolute: 0.1 K/uL (ref 0.0–0.1)
Basophils Relative: 1 %
Eosinophils Absolute: 0.8 K/uL — ABNORMAL HIGH (ref 0.0–0.5)
Eosinophils Relative: 7 %
HCT: 40.1 % (ref 39.0–52.0)
Hemoglobin: 14.2 g/dL (ref 13.0–17.0)
Immature Granulocytes: 1 %
Lymphocytes Relative: 17 %
Lymphs Abs: 1.8 K/uL (ref 0.7–4.0)
MCH: 26.9 pg (ref 26.0–34.0)
MCHC: 35.4 g/dL (ref 30.0–36.0)
MCV: 75.9 fL — ABNORMAL LOW (ref 80.0–100.0)
Monocytes Absolute: 0.9 K/uL (ref 0.1–1.0)
Monocytes Relative: 8 %
Neutro Abs: 7.5 K/uL (ref 1.7–7.7)
Neutrophils Relative %: 66 %
Platelet Count: 263 K/uL (ref 150–400)
RBC: 5.28 MIL/uL (ref 4.22–5.81)
RDW: 13.5 % (ref 11.5–15.5)
WBC Count: 11.1 K/uL — ABNORMAL HIGH (ref 4.0–10.5)
nRBC: 0 % (ref 0.0–0.2)

## 2024-10-29 LAB — CMP (CANCER CENTER ONLY)
ALT: 9 U/L (ref 0–44)
AST: 10 U/L — ABNORMAL LOW (ref 15–41)
Albumin: 4.1 g/dL (ref 3.5–5.0)
Alkaline Phosphatase: 61 U/L (ref 38–126)
Anion gap: 9 (ref 5–15)
BUN: 11 mg/dL (ref 6–20)
CO2: 25 mmol/L (ref 22–32)
Calcium: 9.6 mg/dL (ref 8.9–10.3)
Chloride: 109 mmol/L (ref 98–111)
Creatinine: 0.8 mg/dL (ref 0.61–1.24)
GFR, Estimated: >60 mL/min (ref >60)
Glucose, Bld: 102 mg/dL — ABNORMAL HIGH (ref 70–99)
Potassium: 3.8 mmol/L (ref 3.5–5.1)
Sodium: 143 mmol/L (ref 135–145)
Total Bilirubin: 0.7 mg/dL (ref 0.0–1.2)
Total Protein: 7.2 g/dL (ref 6.5–8.1)

## 2024-10-29 LAB — LACTATE DEHYDROGENASE: LDH: 186 U/L (ref 105–235)

## 2024-10-29 MED ORDER — ONDANSETRON HCL 8 MG PO TABS
8.0000 mg | ORAL_TABLET | Freq: Three times a day (TID) | ORAL | 0 refills | Status: DC | PRN
  Filled 2024-10-29: qty 30, 10d supply, fill #0

## 2024-10-29 MED ORDER — LIDOCAINE-PRILOCAINE 2.5-2.5 % EX CREA: 1.0000 | TOPICAL_CREAM | CUTANEOUS | 0 refills | Status: DC | PRN

## 2024-10-29 MED ORDER — PREDNISONE 20 MG PO TABS
60.0000 mg | ORAL_TABLET | Freq: Every day | ORAL | 5 refills | Status: DC
  Filled 2024-10-29: qty 15, 5d supply, fill #0

## 2024-10-29 MED ORDER — ONDANSETRON HCL 8 MG PO TABS: 8.0000 mg | ORAL_TABLET | Freq: Three times a day (TID) | ORAL | 0 refills | Status: DC | PRN

## 2024-10-29 MED ORDER — ALLOPURINOL 300 MG PO TABS
300.0000 mg | ORAL_TABLET | Freq: Every day | ORAL | 1 refills | Status: AC
  Filled 2024-10-29: qty 90, 90d supply, fill #0
  Filled 2024-10-29: qty 16, 16d supply, fill #0
  Filled 2024-10-29: qty 74, 74d supply, fill #0

## 2024-10-29 MED ORDER — PROCHLORPERAZINE MALEATE 10 MG PO TABS: 10.0000 mg | ORAL_TABLET | Freq: Four times a day (QID) | ORAL | 0 refills | Status: DC | PRN

## 2024-10-29 MED ORDER — PREDNISONE 20 MG PO TABS: 60.0000 mg | ORAL_TABLET | Freq: Every day | ORAL | 5 refills | Status: DC

## 2024-10-29 MED ORDER — PROCHLORPERAZINE MALEATE 10 MG PO TABS
10.0000 mg | ORAL_TABLET | Freq: Four times a day (QID) | ORAL | 0 refills | Status: DC | PRN
  Filled 2024-10-29: qty 30, 8d supply, fill #0

## 2024-10-29 MED ORDER — LIDOCAINE-PRILOCAINE 2.5-2.5 % EX CREA
1.0000 | TOPICAL_CREAM | CUTANEOUS | 0 refills | Status: AC | PRN
  Filled 2024-10-29: qty 30, 7d supply, fill #0

## 2024-10-29 MED ORDER — ALLOPURINOL 300 MG PO TABS: 300.0000 mg | ORAL_TABLET | Freq: Every day | ORAL | 1 refills | Status: DC

## 2024-10-29 NOTE — Progress Notes (Signed)
 START ON PATHWAY REGIMEN - Lymphoma and CLL     A cycle is every 21 days:     Cyclophosphamide      Doxorubicin      Prednisone       Brentuximab vedotin   **Always confirm dose/schedule in your pharmacy ordering system**  Patient Characteristics: T-Cell Lymphoma (Systemic), First Line, ALCL (Anaplastic Large Cell Lymphoma), ALK Positive Disease Type: Not Applicable Disease Type: T-Cell Lymphoma (Systemic) Disease Type: Not Applicable Line of Therapy: First Line T-Cell Lymphoma Subtype: ALCL (Anaplastic Large Cell Lymphoma) ALK Fusion/Rearrangement Status: Positive Intent of Therapy: Curative Intent, Discussed with Patient

## 2024-10-29 NOTE — Progress Notes (Signed)
 Appointment with Dr Federico,  I met patient to give my card and go over the needs that he expressed to me in our phone conversation on 10/25/24. I assured him that I would touch base with the social worker and the transportation coordinator again to have them reach out to him.

## 2024-10-29 NOTE — Progress Notes (Signed)
 [facility]den[facility] changed to [employer] per Vernell Bruns due to patient assistance.  Harlene Nasuti, PharmD Oncology Infusion Pharmacist 10/29/2024 11:07 AM

## 2024-10-30 ENCOUNTER — Encounter: Payer: Self-pay | Admitting: Hematology and Oncology

## 2024-10-30 ENCOUNTER — Other Ambulatory Visit: Payer: Self-pay

## 2024-10-30 NOTE — Progress Notes (Signed)
 [facility] CSW Progress Note  Clinical Child Psychotherapist contacted patient by phone to follow-up on SDOH needs.    Interventions: Covering CSW received a request to contact pt urgently for housing concerns.  Pt reports he rents and has been informed by his landlord that he and his family will need to vacate.  Per pt he originally had a lease for the rental; however, when the lease expired a new lease was not signed and pt pays month to month.  Pt does not have a timeline for when he will need to vacate his current rental, but states they are being asked to leave as soon as possible.  Pt resides w/ his significant other and their 2 children who are ages 23 and 12.  Pt's 22 year old has Down's Syndrome.  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  Pt states his significant other is working, but they do not have enough in savings to get into a new rental.  Pt reports they do receive SNAP benefits.  Covering CSW discussed shelter possibilities w/ pt, including a family shelter.  Pt requesting financial assistance to establish a new rental.  Pt does meet presumptive eligibility through SNAP benefits.  Pt provided w/ contact details for the patient financial assistance navigator and instructed to call once treatment begins.  Primary CSW informed of the above and will follow up w/ pt.           Devere JONELLE Manna, LCSW Clinical Social Worker [facility]

## 2024-10-31 ENCOUNTER — Encounter: Payer: Self-pay | Admitting: Hematology and Oncology

## 2024-10-31 ENCOUNTER — Inpatient Hospital Stay: Payer: Self-pay

## 2024-10-31 ENCOUNTER — Other Ambulatory Visit: Payer: Self-pay

## 2024-10-31 DIAGNOSIS — C8586 Other specified types of non-Hodgkin lymphoma, intrapelvic lymph nodes: Secondary | ICD-10-CM

## 2024-10-31 DIAGNOSIS — F4322 Adjustment disorder with anxiety: Secondary | ICD-10-CM

## 2024-10-31 NOTE — Progress Notes (Signed)
 Pharmacist Chemotherapy Monitoring - Initial Assessment    Anticipated start date: 11/07/24   The following has been reviewed per standard work regarding the patient's treatment regimen: The patient's diagnosis, treatment plan and drug doses, and organ/hematologic function Lab orders and baseline tests specific to treatment regimen  The treatment plan start date, drug sequencing, and pre-medications Prior authorization status  Patient's documented medication list, including drug-drug interaction screen and prescriptions for anti-emetics and supportive care specific to the treatment regimen The drug concentrations, fluid compatibility, administration routes, and timing of the medications to be used The patient's access for treatment and lifetime cumulative dose history, if applicable  The patient's medication allergies and previous infusion related reactions, if applicable   Changes made to treatment plan:  N/A  Follow up needed:  No insurance on file.  F/u pt assitance (brentuximab)   Russell [name], RPH, 10/31/2024  8:45 AM

## 2024-11-04 ENCOUNTER — Inpatient Hospital Stay: Payer: Self-pay

## 2024-11-04 ENCOUNTER — Inpatient Hospital Stay: Payer: Self-pay | Admitting: Licensed Clinical Social Worker

## 2024-11-04 ENCOUNTER — Inpatient Hospital Stay: Payer: Self-pay | Admitting: Hematology and Oncology

## 2024-11-04 ENCOUNTER — Telehealth: Payer: Self-pay | Admitting: *Deleted

## 2024-11-04 ENCOUNTER — Other Ambulatory Visit: Payer: Self-pay | Admitting: Hematology and Oncology

## 2024-11-04 DIAGNOSIS — C8460 Anaplastic large cell lymphoma, ALK-positive, unspecified site: Secondary | ICD-10-CM

## 2024-11-04 DIAGNOSIS — C8586 Other specified types of non-Hodgkin lymphoma, intrapelvic lymph nodes: Secondary | ICD-10-CM

## 2024-11-04 MED FILL — Fosaprepitant Dimeglumine For IV Infusion 150 MG (Base Eq): INTRAVENOUS | Qty: 5 | Status: AC

## 2024-11-04 NOTE — Telephone Encounter (Signed)
 Received call from pt. He states he cannot afford any of his home medications. He starts chemo tomorrow and does not have the antiemetics, his allopurinol or his prednisone . His prescriptions were sent to [facility]. He states they are about $20 a piece and he has 5 prescriptions. Noted SW note from Devere Manna from last week with his housing concerns.  Spoke with Devere today and she will contact patient to discuss his financial needs.

## 2024-11-04 NOTE — Progress Notes (Signed)
 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     A cycle is every 21 days:     Cyclophosphamide      Doxorubicin      Prednisone       Brentuximab vedotin   **Always confirm dose/schedule in your pharmacy ordering system**  PRIOR TREATMENT: [id number]: A + CHP (Brentuximab + Cyclophosphamide + Doxorubicin + Prednisone ) q21 Days x 6 Cycles  START OFF PATHWAY REGIMEN - Lymphoma and CLL   [id number]:CHOP + G-CSF q21 Days:   A cycle is every 21 days:     Prednisone       Cyclophosphamide      Doxorubicin      Vincristine      Pegfilgrastim-xxxx   **Always confirm dose/schedule in your pharmacy ordering system**  Patient Characteristics: T-Cell Lymphoma (Systemic), First Line, ALCL (Anaplastic Large Cell Lymphoma), ALK Positive Disease Type: Not Applicable Disease Type: T-Cell Lymphoma (Systemic) Disease Type: Not Applicable Line of Therapy: First Line T-Cell Lymphoma Subtype: ALCL (Anaplastic Large Cell Lymphoma) ALK Fusion/Rearrangement Status: Positive Intent of Therapy: Curative Intent, Discussed with Patient

## 2024-11-05 ENCOUNTER — Inpatient Hospital Stay (HOSPITAL_BASED_OUTPATIENT_CLINIC_OR_DEPARTMENT_OTHER): Payer: Self-pay | Admitting: Hematology and Oncology

## 2024-11-05 ENCOUNTER — Inpatient Hospital Stay: Payer: Self-pay

## 2024-11-05 ENCOUNTER — Inpatient Hospital Stay: Payer: Self-pay | Admitting: Licensed Clinical Social Worker

## 2024-11-05 ENCOUNTER — Encounter: Payer: Self-pay | Admitting: Hematology and Oncology

## 2024-11-05 VITALS — BP 126/84 | HR 84 | Temp 97.2°F | Resp 17 | Ht 77.0 in | Wt 251.0 lb

## 2024-11-05 DIAGNOSIS — C8586 Other specified types of non-Hodgkin lymphoma, intrapelvic lymph nodes: Secondary | ICD-10-CM

## 2024-11-05 DIAGNOSIS — R591 Generalized enlarged lymph nodes: Secondary | ICD-10-CM

## 2024-11-05 DIAGNOSIS — C8460 Anaplastic large cell lymphoma, ALK-positive, unspecified site: Secondary | ICD-10-CM

## 2024-11-05 LAB — CBC WITH DIFFERENTIAL (CANCER CENTER ONLY)
Abs Immature Granulocytes: 0.06 K/uL (ref 0.00–0.07)
Basophils Absolute: 0.1 K/uL (ref 0.0–0.1)
Basophils Relative: 1 %
Eosinophils Absolute: 0.5 K/uL (ref 0.0–0.5)
Eosinophils Relative: 4 %
HCT: 38.6 % — ABNORMAL LOW (ref 39.0–52.0)
Hemoglobin: 13.7 g/dL (ref 13.0–17.0)
Immature Granulocytes: 1 %
Lymphocytes Relative: 17 %
Lymphs Abs: 1.8 K/uL (ref 0.7–4.0)
MCH: 26.9 pg (ref 26.0–34.0)
MCHC: 35.5 g/dL (ref 30.0–36.0)
MCV: 75.8 fL — ABNORMAL LOW (ref 80.0–100.0)
Monocytes Absolute: 0.6 K/uL (ref 0.1–1.0)
Monocytes Relative: 6 %
Neutro Abs: 7.4 K/uL (ref 1.7–7.7)
Neutrophils Relative %: 71 %
Platelet Count: 299 K/uL (ref 150–400)
RBC: 5.09 MIL/uL (ref 4.22–5.81)
RDW: 13.4 % (ref 11.5–15.5)
WBC Count: 10.3 K/uL (ref 4.0–10.5)
nRBC: 0 % (ref 0.0–0.2)

## 2024-11-05 LAB — CMP (CANCER CENTER ONLY)
ALT: 10 U/L (ref 0–44)
AST: 13 U/L — ABNORMAL LOW (ref 15–41)
Albumin: 4.3 g/dL (ref 3.5–5.0)
Alkaline Phosphatase: 72 U/L (ref 38–126)
Anion gap: 12 (ref 5–15)
BUN: 9 mg/dL (ref 6–20)
CO2: 24 mmol/L (ref 22–32)
Calcium: 9.4 mg/dL (ref 8.9–10.3)
Chloride: 105 mmol/L (ref 98–111)
Creatinine: 0.86 mg/dL (ref 0.61–1.24)
GFR, Estimated: >60 mL/min (ref >60)
Glucose, Bld: 92 mg/dL (ref 70–99)
Potassium: 3.5 mmol/L (ref 3.5–5.1)
Sodium: 141 mmol/L (ref 135–145)
Total Bilirubin: 0.8 mg/dL (ref 0.0–1.2)
Total Protein: 7.2 g/dL (ref 6.5–8.1)

## 2024-11-05 MED ORDER — SODIUM CHLORIDE 0.9 % IV SOLN
750.0000 mg/m2 | Freq: Once | INTRAVENOUS | Status: AC
  Administered 2024-11-05: 2000 mg via INTRAVENOUS
  Filled 2024-11-05: qty 100

## 2024-11-05 MED ORDER — SODIUM CHLORIDE 0.9 % IV SOLN: INTRAVENOUS | Status: DC

## 2024-11-05 MED ORDER — DEXAMETHASONE SOD PHOSPHATE PF 10 MG/ML IJ SOLN
10.0000 mg | Freq: Once | INTRAMUSCULAR | Status: AC
  Administered 2024-11-05: 10 mg via INTRAVENOUS

## 2024-11-05 MED ORDER — VINCRISTINE SULFATE CHEMO INJECTION 1 MG/ML
2.0000 mg | Freq: Once | INTRAVENOUS | Status: AC
  Administered 2024-11-05: 2 mg via INTRAVENOUS
  Filled 2024-11-05: qty 2

## 2024-11-05 MED ORDER — DOXORUBICIN HCL CHEMO IV INJECTION 2 MG/ML
50.0000 mg/m2 | Freq: Once | INTRAVENOUS | Status: AC
  Administered 2024-11-05: 126 mg via INTRAVENOUS
  Filled 2024-11-05: qty 63

## 2024-11-05 MED ORDER — PALONOSETRON HCL INJECTION 0.25 MG/5ML
0.2500 mg | Freq: Once | INTRAVENOUS | Status: AC
  Administered 2024-11-05: 0.25 mg via INTRAVENOUS
  Filled 2024-11-05: qty 5

## 2024-11-05 MED ORDER — SODIUM CHLORIDE 0.9 % IV SOLN
150.0000 mg | Freq: Once | INTRAVENOUS | Status: AC
  Administered 2024-11-05: 150 mg via INTRAVENOUS
  Filled 2024-11-05: qty 150

## 2024-11-05 NOTE — Patient Instructions (Signed)
 [facility] - A DEPT OF [facility]  Discharge Instructions: Thank you for choosing [facility] to provide your oncology and hematology care.   If you have a lab appointment with the Cancer Center, please go directly to the Cancer Center and check in at the registration area.   Wear comfortable clothing and clothing appropriate for easy access to any Portacath or PICC line.   We strive to give you quality time with your provider. You may need to reschedule your appointment if you arrive late (15 or more minutes).  Arriving late affects you and other patients whose appointments are after yours.  Also, if you miss three or more appointments without notifying the office, you may be dismissed from the clinic at the provider's discretion.      For prescription refill requests, have your pharmacy contact our office and allow 72 hours for refills to be completed.    Today you received the following chemotherapy and/or immunotherapy agents: Doxorubicin (Adriamycin), Vincristine (Oncovin), & Cyclophosphamide (Cytoxan)    To help prevent nausea and vomiting after your treatment, we encourage you to take your nausea medication as directed.  BELOW ARE SYMPTOMS THAT SHOULD BE REPORTED IMMEDIATELY: *FEVER GREATER THAN 100.4 F (38 C) OR HIGHER *CHILLS OR SWEATING *NAUSEA AND VOMITING THAT IS NOT CONTROLLED WITH YOUR NAUSEA MEDICATION *UNUSUAL SHORTNESS OF BREATH *UNUSUAL BRUISING OR BLEEDING *URINARY PROBLEMS (pain or burning when urinating, or frequent urination) *BOWEL PROBLEMS (unusual diarrhea, constipation, pain near the anus) TENDERNESS IN MOUTH AND THROAT WITH OR WITHOUT PRESENCE OF ULCERS (sore throat, sores in mouth, or a toothache) UNUSUAL RASH, SWELLING OR PAIN  UNUSUAL VAGINAL DISCHARGE OR ITCHING   Items with * indicate a potential emergency and should be followed up as soon as possible or go to the Emergency Department if any problems should  occur.  Please show the CHEMOTHERAPY ALERT CARD or IMMUNOTHERAPY ALERT CARD at check-in to the Emergency Department and triage nurse.  Should you have questions after your visit or need to cancel or reschedule your appointment, please contact [facility]  Dept: [phone]  and follow the prompts.  Office hours are 8:00 a.m. to 4:30 p.m. Monday - Friday. Please note that voicemails left after 4:00 p.m. may not be returned until the following business day.  We are closed weekends and major holidays. You have access to a nurse at all times for urgent questions. Please call the main number to the clinic Dept: [phone] and follow the prompts.   For any non-urgent questions, you may also contact your provider using MyChart. We now offer e-Visits for anyone 45 and older to request care online for non-urgent symptoms. For details visit [email or web address].   Also download the MyChart app! Go to the app store, search MyChart, open the app, select , and log in with your MyChart username and password.

## 2024-11-05 NOTE — Progress Notes (Signed)
 [facility] [phone]   [phone]  PROGRESS NOTE  Patient Care Team: Patient, No Pcp Per as PCP - General (General Practice) Elana Montie CROME, RN as Oncology Nurse Navigator  Hematological/Oncological History # Anaplastic T Cell Lymphoma, Alk Positive. Stage IV  09/26/2024: CT scan performed for groin swelling shows extensive adenopathy in the right inguinal, iliac, and in the peri-aortic regions 09/27/2024: Establish care with the rapid diagnostic clinic. 10/10/2024: US -guided core biopsy showed ALK positive anaplastic large cell lymphoma 10/17/2024: PET CT scan shows extensive abdominal lymphadenopathy with axillary lymph node involvement as well.  Confirms at least stage III disease. 11/05/2024: Cycle 1 Day 1 of CHOP (Brentixumab to be administered once patient has Medicaid)  Interval History:  Russell [name] 33 y.o. male with medical history significant for newly diagnosed Anaplastic T Cell lymphoma (ALK positive) who presents for a follow up visit. The patient's last visit was on 10/29/2024. In the interim since the last visit he completed chemotherapy education and presents today for cycle 1 of treatment.  On exam today Russell [name] is accompanied by his ex-girlfriend.  He reports that he has had no major changes in his health since her last visit.  He denies any runny nose, sore throat, cough.  He is not having any fevers, chills, sweats.  He continues to sleep poorly and is only sleeping about 1 hour per night.  He reports he feels exhausted and cannot sleep.  He notes he has been on trazodone before in the past but unfortunately he began to abuse it.  He has tried melatonin before in the past but is open to something like ZzzQuil.  He notes he did well with chemotherapy education and has no residual questions concerns or complaints.  Full 10 point ROS otherwise negative and he is willing and able to proceed with treatment today.  The bulk of our  discussion focused on assuring the patient is everything necessary to start treatment today.  MEDICAL HISTORY:  Past Medical History:  Diagnosis Date   Allergy    Anxiety    Asthma    Asthma 01/13/2023   Depression    Left elbow pain 01/13/2023   Elbow > shoulder Radiates to numbing sensation in pinky and ring finger. Not lifting heavy stuff   Obesity     SURGICAL HISTORY: Past Surgical History:  Procedure Laterality Date   IR IMAGING GUIDED PORT INSERTION  10/23/2024   TONSILLECTOMY     TYMPANOSTOMY TUBE PLACEMENT      SOCIAL HISTORY: Social History   Socioeconomic History   Marital status: Significant Other    Spouse name: Not on file   Number of children: Not on file   Years of education: Not on file   Highest education level: Not on file  Occupational History   Not on file  Tobacco Use   Smoking status: Never   Smokeless tobacco: Current    Types: Chew  Vaping Use   Vaping status: Some Days  Substance and Sexual Activity   Alcohol use: Not Currently   Drug use: Yes    Frequency: 7.0 times per week    Types: Marijuana    Comment: smoke marijuana   Sexual activity: Yes  Other Topics Concern   Not on file  Social History Narrative   Not on file   Social Drivers of Health   Financial Resource Strain: Not on file  Food Insecurity: Not on file  Transportation Needs: Not on file  Physical  Activity: Not on file  Stress: Not on file  Social Connections: Not on file  Intimate Partner Violence: Not on file    FAMILY HISTORY: Family History  Problem Relation Age of Onset   Hyperlipidemia Mother    Early death Mother    Hypertension Mother    Mental illness Mother    Alcohol abuse Father    Asthma Father    COPD Father    Drug abuse Father    Early death Father    Hypertension Father    Hyperlipidemia Father    Stroke Father    Heart attack Father    Cancer Maternal Grandmother    Heart disease Maternal Grandfather    Skin cancer Maternal  Grandfather    Mental retardation Maternal Uncle     ALLERGIES:  is allergic to penicillins.  MEDICATIONS:  Current Outpatient Medications  Medication Sig Dispense Refill   albuterol  (VENTOLIN  HFA) 108 (90 Base) MCG/ACT inhaler Inhale 1-2 puffs into the lungs every 6 (six) hours as needed. 18 g 2   allopurinol (ZYLOPRIM) 300 MG tablet Take 1 tablet (300 mg total) by mouth daily. 90 tablet 1   cetirizine  (ZYRTEC ) 10 MG tablet Take 1 tablet (10 mg total) by mouth daily. 30 tablet 1   doxycycline  (VIBRAMYCIN ) 100 MG capsule Take 1 capsule (100 mg total) by mouth 2 (two) times daily. (Patient not taking: Reported on 10/29/2024) 20 capsule 0   fluticasone  (FLONASE ) 50 MCG/ACT nasal spray Place 2 sprays into both nostrils daily. 16 g 0   lidocaine -prilocaine (EMLA) cream Apply topically as needed. 30 g 0   mometasone -formoterol  (DULERA ) 200-5 MCG/ACT AERO Inhale 2 puffs into the lungs 2 (two) times daily. 13 g 0   ondansetron  (ZOFRAN ) 8 MG tablet Take 1 tablet (8 mg total) by mouth every 8 (eight) hours as needed. 30 tablet 0   ondansetron  (ZOFRAN -ODT) 4 MG disintegrating tablet Take 1 tablet (4 mg total) by mouth every 8 (eight) hours as needed for nausea or vomiting. (Patient not taking: Reported on 10/29/2024) 20 tablet 0   oxyCODONE (ROXICODONE) 5 MG immediate release tablet Take 1 tablet (5 mg total) by mouth every 6 (six) hours as needed for severe pain (pain score 7-10). (Patient not taking: Reported on 10/29/2024) 10 tablet 0   predniSONE  (DELTASONE ) 20 MG tablet Take 3 tablets by mouth daily with breakfast on the first day of chemotherapy and continue for total of 5 days.  Repeat each cycle. 15 tablet 5   prochlorperazine (COMPAZINE) 10 MG tablet Take 1 tablet (10 mg total) by mouth every 6 (six) hours as needed for nausea or vomiting. 30 tablet 0   No current facility-administered medications for this visit.   Facility-Administered Medications Ordered in Other Visits  Medication Dose  Route Frequency Provider Last Rate Last Admin   0.9 %  sodium chloride  infusion   Intravenous Continuous Federico Norleen DASEN IV, MD 10 mL/hr at 11/05/24 1430 New Bag at 11/05/24 1430   cyclophosphamide (CYTOXAN) 2,000 mg in sodium chloride  0.9 % 500 mL chemo infusion  750 mg/m2 (Treatment Plan Recorded) Intravenous Once Eason Housman T IV, MD       vinCRIStine (ONCOVIN) 2 mg in sodium chloride  0.9 % 50 mL chemo infusion  2 mg Intravenous Once Justus Duerr T IV, MD 312 mL/hr at 11/05/24 1628 2 mg at 11/05/24 1628    REVIEW OF SYSTEMS:   Constitutional: ( - ) fevers, ( - )  chills , ( - ) night  sweats Eyes: ( - ) blurriness of vision, ( - ) double vision, ( - ) watery eyes Ears, nose, mouth, throat, and face: ( - ) mucositis, ( - ) sore throat Respiratory: ( - ) cough, ( - ) dyspnea, ( - ) wheezes Cardiovascular: ( - ) palpitation, ( - ) chest discomfort, ( - ) lower extremity swelling Gastrointestinal:  ( - ) nausea, ( - ) heartburn, ( - ) change in bowel habits Skin: ( - ) abnormal skin rashes Lymphatics: ( - ) new lymphadenopathy, ( - ) easy bruising Neurological: ( - ) numbness, ( - ) tingling, ( - ) new weaknesses Behavioral/Psych: ( - ) mood change, ( - ) new changes  All other systems were reviewed with the patient and are negative.  PHYSICAL EXAMINATION: ECOG PERFORMANCE STATUS: 1 - Symptomatic but completely ambulatory  Vitals:   11/05/24 1344  BP: 126/84  Pulse: 84  Resp: 17  Temp: (!) 97.2 F (36.2 C)  SpO2: 100%    Filed Weights   11/05/24 1344  Weight: 251 lb (113.9 kg)     GENERAL: Well-appearing middle-age Caucasian male, alert, no distress and comfortable SKIN: skin color, texture, turgor are normal, no rashes or significant lesions EYES: conjunctiva are pink and non-injected, sclera clear LUNGS: clear to auscultation and percussion with normal breathing effort HEART: regular rate & rhythm and no murmurs and no lower extremity edema Musculoskeletal: no cyanosis of  digits and no clubbing  PSYCH: alert & oriented x 3, fluent speech NEURO: no focal motor/sensory deficits  LABORATORY DATA:  I have reviewed the data as listed    Latest Ref Rng & Units 11/05/2024    1:17 PM 10/29/2024   10:13 AM 09/27/2024    3:43 PM  CBC  WBC 4.0 - 10.5 K/uL 10.3  11.1  12.8   Hemoglobin 13.0 - 17.0 g/dL 86.2  85.7  85.0   Hematocrit 39.0 - 52.0 % 38.6  40.1  41.1   Platelets 150 - 400 K/uL 299  263  309        Latest Ref Rng & Units 11/05/2024    1:17 PM 10/29/2024   10:13 AM 09/27/2024    3:43 PM  CMP  Glucose 70 - 99 mg/dL 92  897  73   BUN 6 - 20 mg/dL 9  11  13    Creatinine 0.61 - 1.24 mg/dL 9.13  9.19  9.04   Sodium 135 - 145 mmol/L 141  143  139   Potassium 3.5 - 5.1 mmol/L 3.5  3.8  3.8   Chloride 98 - 111 mmol/L 105  109  102   CO2 22 - 32 mmol/L 24  25  26    Calcium 8.9 - 10.3 mg/dL 9.4  9.6  9.6   Total Protein 6.5 - 8.1 g/dL 7.2  7.2  8.0   Total Bilirubin 0.0 - 1.2 mg/dL 0.8  0.7  1.6   Alkaline Phos 38 - 126 U/L 72  61  107   AST 15 - 41 U/L 13  10  31    ALT 0 - 44 U/L 10  9  23      RADIOGRAPHIC STUDIES: IR IMAGING GUIDED PORT INSERTION Result Date: 10/23/2024 INDICATION: 33 year old with large cell lymphoma. EXAM: FLUOROSCOPIC AND ULTRASOUND GUIDED PLACEMENT OF A SUBCUTANEOUS PORT MEDICATIONS: Moderate sedation ANESTHESIA/SEDATION: Moderate (conscious) sedation was employed during this procedure. A total of Versed 2 mg and fentanyl 100 mcg was administered intravenously at the order of the  provider performing the procedure. Total intra-service moderate sedation time: 26 minutes. Patient's level of consciousness and vital signs were monitored continuously by radiology nurse throughout the procedure under the supervision of the provider performing the procedure. FLUOROSCOPY TIME:  Radiation Exposure Index (as provided by the fluoroscopic device): 2 mGy Kerma COMPLICATIONS: None immediate. PROCEDURE: The procedure, risks, benefits, and  alternatives were explained to the patient. Questions regarding the procedure were encouraged and answered. The patient understands and consents to the procedure. Patient was placed supine on the interventional table. Ultrasound confirmed a patent right internal jugular vein. Ultrasound image was saved for documentation. The right chest and neck were cleaned with a skin antiseptic and a sterile drape was placed. Maximal barrier sterile technique was utilized including caps, mask, sterile gowns, sterile gloves, sterile drape, hand hygiene and skin antiseptic. The right neck was anesthetized with 1% lidocaine . Small incision was made in the right neck with a blade. Micropuncture set was placed in the right internal jugular vein with ultrasound guidance. The micropuncture wire was used for measurement purposes. The right chest was anesthetized with 1% lidocaine  with epinephrine . #15 blade was used to make an incision and a subcutaneous port pocket was formed. 8 french Power Port was assembled. Subcutaneous tunnel was formed with a stiff tunneling device. The port catheter was brought through the subcutaneous tunnel. The port was placed in the subcutaneous pocket. The micropuncture set was exchanged for a peel-away sheath. The catheter was placed through the peel-away sheath and the tip was positioned at the superior cavoatrial junction. Catheter placement was confirmed with fluoroscopy. The port was accessed and flushed with heparinized saline. The port pocket was closed using two layers of absorbable sutures and Dermabond. The vein skin site was closed using a single layer of absorbable suture and Dermabond. Sterile dressings were applied. Patient tolerated the procedure well without an immediate complication. Ultrasound and fluoroscopic images were taken and saved for this procedure. IMPRESSION: Placement of a subcutaneous power-injectable port device. Catheter tip at the superior cavoatrial junction. Electronically  Signed   By: Juliene Balder M.D.   On: 10/23/2024 15:02   ECHOCARDIOGRAM COMPLETE Result Date: 10/18/2024    ECHOCARDIOGRAM REPORT   Patient Name:   Russell [name] Date of Exam: 10/18/2024 Medical Rec #:  [id number]      Height:       77.0 in Accession #:    [id number]     Weight:       267.0 lb Date of Birth:  Jul 30, 1991     BSA:          2.529 m Patient Age:    32 years       BP:           122/83 mmHg Patient Gender: M              HR:           96 bpm. Exam Location:  Inpatient Procedure: 2D Echo (Both Spectral and Color Flow Doppler were utilized during            procedure). Indications:    Chemo  History:        Patient has no prior history of Echocardiogram examinations.                 Signs/Symptoms:Chemo.  Sonographer:    Norleen Amour Referring Phys: [id number] IRENE T THAYIL IMPRESSIONS  1. Note that the baseline strain is mildly reduced at -14.9% prior to chemotherapy initiation.  2. Left ventricular ejection fraction, by estimation, is 55 to 60%. Left ventricular ejection fraction by 2D MOD biplane is 55.0 %. The left ventricle has normal function. The left ventricle has no regional wall motion abnormalities. There is mild concentric left ventricular hypertrophy. Left ventricular diastolic parameters are consistent with Grade I diastolic dysfunction (impaired relaxation). The average left ventricular global longitudinal strain is -14.9 %. The global longitudinal strain is abnormal.  3. Right ventricular systolic function is normal. The right ventricular size is normal. Tricuspid regurgitation signal is inadequate for assessing PA pressure.  4. The mitral valve is normal in structure. Trivial mitral valve regurgitation. No evidence of mitral stenosis.  5. The aortic valve is tricuspid. Aortic valve regurgitation is not visualized. No aortic stenosis is present.  6. The inferior vena cava is normal in size with greater than 50% respiratory variability, suggesting right atrial pressure of 3 mmHg.  Comparison(s): No prior Echocardiogram. Conclusion(s)/Recommendation(s): Otherwise normal echocardiogram, with minor abnormalities described in the report. FINDINGS  Left Ventricle: Left ventricular ejection fraction, by estimation, is 55 to 60%. Left ventricular ejection fraction by 2D MOD biplane is 55.0 %. The left ventricle has normal function. The left ventricle has no regional wall motion abnormalities. The average left ventricular global longitudinal strain is -14.9 %. Strain was performed and the global longitudinal strain is abnormal. The left ventricular internal cavity size was normal in size. There is mild concentric left ventricular hypertrophy. Left  ventricular diastolic parameters are consistent with Grade I diastolic dysfunction (impaired relaxation). Right Ventricle: The right ventricular size is normal. No increase in right ventricular wall thickness. Right ventricular systolic function is normal. Tricuspid regurgitation signal is inadequate for assessing PA pressure. Left Atrium: Left atrial size was normal in size. Right Atrium: Right atrial size was normal in size. Pericardium: There is no evidence of pericardial effusion. Mitral Valve: The mitral valve is normal in structure. Trivial mitral valve regurgitation. No evidence of mitral valve stenosis. Tricuspid Valve: The tricuspid valve is normal in structure. Tricuspid valve regurgitation is trivial. No evidence of tricuspid stenosis. Aortic Valve: The aortic valve is tricuspid. Aortic valve regurgitation is not visualized. No aortic stenosis is present. Pulmonic Valve: The pulmonic valve was normal in structure. Pulmonic valve regurgitation is not visualized. No evidence of pulmonic stenosis. Aorta: The aortic root and ascending aorta are structurally normal, with no evidence of dilitation. Venous: The inferior vena cava is normal in size with greater than 50% respiratory variability, suggesting right atrial pressure of 3 mmHg. IAS/Shunts: No  atrial level shunt detected by color flow Doppler.  LEFT VENTRICLE PLAX 2D                        Biplane EF (MOD) LVIDd:         4.80 cm         LV Biplane EF:   Left LVIDs:         2.70 cm                          ventricular LV PW:         1.10 cm                          ejection LV IVS:        1.10 cm  fraction by LVOT diam:     2.50 cm                          2D MOD LV SV:         58                               biplane is LV SV Index:   23                               55.0 %. LVOT Area:     4.91 cm LV IVRT:       77 msec         Diastology                                LV e' medial:    5.77 cm/s                                LV E/e' medial:  10.1 LV Volumes (MOD)               LV e' lateral:   6.64 cm/s LV vol d, MOD    157.0 ml      LV E/e' lateral: 8.8 A2C: LV vol d, MOD    144.0 ml      2D Longitudinal A4C:                           Strain LV vol s, MOD    64.1 ml       2D Strain GLS   -14.9 % A2C:                           Avg: LV vol s, MOD    74.1 ml A4C: LV SV MOD A2C:   92.9 ml LV SV MOD A4C:   144.0 ml LV SV MOD BP:    86.1 ml RIGHT VENTRICLE             IVC RV Basal diam:  3.50 cm     IVC diam: 2.00 cm RV S prime:     12.00 cm/s TAPSE (M-mode): 2.5 cm      PULMONARY VEINS                             Diastolic Velocity: 44.80 cm/s                             S/D Velocity:       1.20                             Systolic Velocity:  52.80 cm/s LEFT ATRIUM             Index        RIGHT ATRIUM           Index LA diam:        4.10 cm 1.62 cm/m   RA Area:     19.70  cm LA Vol (A2C):   54.0 ml 21.35 ml/m  RA Volume:   52.70 ml  20.84 ml/m LA Vol (A4C):   42.2 ml 16.69 ml/m LA Biplane Vol: 47.8 ml 18.90 ml/m  AORTIC VALVE             PULMONIC VALVE LVOT Vmax:   78.20 cm/s  PV Vmax:       0.94 m/s LVOT Vmean:  50.800 cm/s PV Peak grad:  3.6 mmHg LVOT VTI:    0.119 m  AORTA Ao Root diam: 3.80 cm Ao Asc diam:  3.40 cm MITRAL VALVE MV Area (PHT): 3.74 cm    SHUNTS MV Decel  Time: 203 msec    Systemic VTI:  0.12 m MV E velocity: 58.20 cm/s  Systemic Diam: 2.50 cm MV A velocity: 82.30 cm/s MV E/A ratio:  0.71 Georganna Archer Electronically signed by Georganna Archer Signature Date/Time: 10/18/2024/3:10:14 PM    Final    NM PET Image Initial (PI) Skull Base To Thigh Result Date: 10/17/2024 EXAM: PET AND CT SKULL BASE TO MID THIGH 10/17/2024 05:14:42 PM TECHNIQUE: RADIOPHARMACEUTICAL: 13.31 mCi F-18 FDG Uptake time 60 minutes. Glucose level 98 mg/dl. Blood pool SUV 2.4; hepatic parenchyma SUV 3.6. PET imaging was acquired from the base of the skull to the mid thighs. Non-contrast enhanced computed tomography was obtained for attenuation correction and anatomic localization. COMPARISON: None available. CLINICAL HISTORY: Newly diagnosed lymphoma, staging. FINDINGS: HEAD AND NECK: Nonspecific posterior nasopharyngeal activity with maximum SUV 7.3. Mildly asymmetric palatine tonsillar activity with maximum SUV on the left at 7.1 and on the right at 4.8. No metabolically active cervical lymphadenopathy. CHEST: 0.8 cm right axillary lymph node on image 83 series 4 has a maximum SUV of 11.2, L5. Out metabolic activity, maximum SUV 3.9, this is a relatively non masslike configuration and is stable on the CT data from 03/10/2015, favoring thymic tissue over lymphoma involvement. No metabolically active pulmonary nodules. ABDOMEN AND PELVIS: Hypermetabolic retroperitoneal, mesenteric, right external iliac, and right inguinal adenopathy. Index small lymph node adjacent to the SMA origin with maximum SUV 6.5, L4. Index left common iliac node 0.9 cm in short axis on image 172 series 4 with maximum SUV 18.3, L5. Bulky right inguinal adenopathy including a lower inguinal node measuring 2.7 cm in short axis on image 250 series 4 with maximum SUV 24.3, L5. Low grade stranding in the retroperitoneum and along the right pelvic sidewall and right groin region. No focal accentuated splenic activity  identified. The spleen measures 12.5 x 6.8 x 12.0 cm (530 cc) compatible with mild splenomegaly. Physiologic activity within the gastrointestinal and genitourinary systems. BONES AND SOFT TISSUE: No abnormal FDG activity localizes to the bones. No metabolically active aggressive osseous lesion. IMPRESSION: 1. Hypermetabolic retroperitoneal, mesenteric, right external iliac, and right inguinal adenopathy, consistent with lymphoma involvement. 2. Hypermetabolic right axillary lymph node measuring 0.8 cm with maximum SUV 11.2, consistent with nodal involvement. 3. Nonspecific posterior nasopharyngeal hypermetabolism and mildly asymmetric palatine tonsillar activity, favor benign physiologic activity over neoplastic involvement. 4. Mild splenomegaly without focal accentuated splenic activity. Electronically signed by: Ryan Salvage MD 10/17/2024 05:43 PM EDT RP Workstation: [id number]   US  CORE BIOPSY (LYMPH NODES) Result Date: 10/10/2024 INDICATION: Right inguinal, iliac and para-aortic lymphadenopathy. The patient presents for right inguinal lymph node biopsy. EXAM: ULTRASOUND GUIDED CORE BIOPSY OF RIGHT INGUINAL LYMPH NODE MEDICATIONS: None. ANESTHESIA/SEDATION: None PROCEDURE: The procedure, risks, benefits, and alternatives were explained to the patient. Questions regarding the procedure were encouraged and  answered. The patient understands and consents to the procedure. A time out was performed prior to initiating the procedure. The right groin region was prepped with chlorhexidine in a sterile fashion, and a sterile drape was applied covering the operative field. A sterile gown and sterile gloves were used for the procedure. Local anesthesia was provided with 1% Lidocaine . Enlarged right inguinal lymph nodes were localized by ultrasound. A 16 gauge core biopsy device was utilized in obtaining 5 core biopsy samples adjacent enlarged lymph nodes in the anterior right inguinal region. Post biopsy ultrasound  images were obtained. COMPLICATIONS: None immediate. FINDINGS: Multitude of enlarged right inguinal lymph nodes are identified. Two adjacent anterior nodes each measuring up to approximately 3.4 cm in maximal diameter were targeted with multiple samples obtained in both lymph nodes. IMPRESSION: Ultrasound-guided core biopsy performed of enlarged right inguinal lymph nodes. Electronically Signed   By: Marcey Moan M.D.   On: 10/10/2024 15:55    ASSESSMENT & PLAN Russell [name] 33 y.o. male with medical history significant for newly diagnosed Anaplastic T Cell lymphoma (ALK positive) who presents for a follow up visit.  # Anaplastic T Cell Lymphoma, Alk Positive. Stage III  -- Diagnosis confirmed based on biopsy, PET CT scan confirms at least stage III disease -- Biopsy confirms anaplastic T-cell lymphoma with ALK positivity --Will plan to proceed with brentuximab CHP treatment.  This will be administered every 3 weeks for total of 6 cycles unfortunately patient does not have insurance and we were unable to provide brentuximab treatment.  Instead we will do CHOP chemotherapy until we can administer rituximab. -- Will plan for interval PET CT scan after cycle 3 -- White blood cell 10.3, hemoglobin 13.7, MCV 75.8, platelets 299.  LFTs and creatinine within normal limits. -- Return to clinic cycle 2 of treatment with interval G-CSF shot.   #Supportive Care -- chemotherapy education complete -- port placed -- zofran  8mg  q8H PRN and compazine 10mg  PO q6H for nausea -- allopurinol 300mg  PO daily for TLS prophylaxis -- EMLA cream for port -- no pain medication required at this time.    Orders Placed This Encounter  Procedures   CBC with Differential (Cancer Center Only)    Standing Status:   Future    Expected Date:   11/26/2024    Expiration Date:   11/26/2025   CMP (Cancer Center only)    Standing Status:   Future    Expected Date:   11/26/2024    Expiration Date:   11/26/2025   CBC with  Differential (Cancer Center Only)    Standing Status:   Future    Expected Date:   12/17/2024    Expiration Date:   12/17/2025   CMP (Cancer Center only)    Standing Status:   Future    Expected Date:   12/17/2024    Expiration Date:   12/17/2025   CBC with Differential (Cancer Center Only)    Standing Status:   Future    Expected Date:   01/07/2025    Expiration Date:   01/07/2026   CMP (Cancer Center only)    Standing Status:   Future    Expected Date:   01/07/2025    Expiration Date:   01/07/2026    All questions were answered. The patient knows to call the clinic with any problems, questions or concerns.  A total of more than 30 minutes were spent on this encounter with face-to-face time and non-face-to-face time, including preparing to see the  patient, ordering tests and/or medications, counseling the patient and coordination of care as outlined above.   Norleen IVAR Kidney, MD Department of Hematology/Oncology [facility] Phone: [phone] Pager: [phone] Email: [email or web address]  11/05/2024 4:33 PM

## 2024-11-05 NOTE — Progress Notes (Signed)
 [facility] CSW Progress Note    Interventions: Covering CSW received a call from RN Landry Arabia regarding pt's inability to pay for his medications at the pharmacy.  Treatment is scheduled to start 11/18.  Covering CSW contacted pt by phone and reminded him of the [employer] which can be used at the pharmacy.  Pt to reach out to [employer] to apply for the [employer] while undergoing treatment to ensure he is able to get his medication from the pharmacy.  Covering CSW to request RN in infusion have medication delivered to infusion as pt is using the cancer center's transportation to and from treatment and will not be able to make the additional stop for medication.  Primary CSW informed of the above.        Follow Up Plan:  Primary CSW to f/u w/ pt.     Devere JONELLE Manna, LCSW Clinical Social Worker [facility]

## 2024-11-06 ENCOUNTER — Other Ambulatory Visit (HOSPITAL_COMMUNITY): Payer: Self-pay

## 2024-11-06 ENCOUNTER — Inpatient Hospital Stay: Payer: Self-pay

## 2024-11-06 ENCOUNTER — Telehealth: Payer: Self-pay

## 2024-11-06 ENCOUNTER — Encounter: Payer: Self-pay | Admitting: Hematology and Oncology

## 2024-11-06 NOTE — Progress Notes (Signed)
 [facility] Clinical Social Work  Initial Assessment   Russell [name] is a 33 y.o. year old male presenting alone. Clinical Social Work was referred by nurse for need for community resources.   SDOH (Social Determinants of Health) assessments performed: No   SDOH Screenings   Housing: High Risk (11/06/2024)  Transportation Needs: Unmet Transportation Needs (11/06/2024)  Utilities: At Risk (11/06/2024)  Depression (PHQ2-9): Medium Risk (01/13/2023)  Financial Resource Strain: High Risk (11/06/2024)  Tobacco Use: High Risk (10/23/2024)    PHQ 2/9:    01/13/2023    9:21 AM 05/13/2016    1:44 PM 02/03/2016    6:20 PM  Depression screen PHQ 2/9  Decreased Interest 1 1 0  Down, Depressed, Hopeless 1 1 0  PHQ - 2 Score 2 2 0  Altered sleeping 1 0   Tired, decreased energy 3 1   Change in appetite 2 1   Feeling bad or failure about yourself  0 1   Trouble concentrating 1 1   Moving slowly or fidgety/restless 0 0   Suicidal thoughts 0 0    PHQ-9 Score 9  6    Difficult doing work/chores Somewhat difficult Somewhat difficult      Data saved with a previous flowsheet row definition     Distress Screen completed: Yes    10/31/2024    4:51 PM  ONCBCN DISTRESS SCREENING  Screening Type Initial Screening  How much distress have you been experiencing in the past week? (0-10) 7  Practical concerns type Housing/Utilities;Finances;Insurance;Transportation;Having enough food;Access to medicine  Emotional concerns type Worry or anxiety;Sadness or depression;Fear  Physical Concerns Type  Sleep;Fatigue;Changes in eating      Family/Social Information:  Housing Arrangement: patient lives with his significant other and two children.  Family members/support persons in your life? Family Transportation concerns: yes, patient has transportation barriers. Patient has been set up with transportation through cancer center.  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  Income source: Supported by Family and Friends and No income Financial concerns: Yes, due to illness and/or loss of work during treatment Type of concern: Utilities, Government social research officer, Phone, and Transportation Food access concerns: no, is enrolled in [employer] benefits.  Religious or spiritual practice: Not known Advanced directives: No   Coping/ Adjustment to diagnosis: Patient understands treatment plan and what happens next? yes Concerns about diagnosis and/or treatment: Feelings of anger or sadness, Losing my job and/or losing income, and How I will pay for the services I need Patient reported stressors: Finances, Transportation, and Adjusting to my illness Current coping skills/ strengths: Ability for insight , Active sense of humor , Average or above average intelligence , Capable of independent living , Communication skills , General fund of knowledge , Motivation for treatment/growth , and Supportive family/friends     SUMMARY: Current SDOH Barriers:  Financial constraints related to basic needs. Utilities and Housing and Transportation  Clinical Social Work Clinical Goal(s):  Explore community resource options for unmet needs related to:  Corporate Treasurer  and Housing .   Interventions: Discussed common feeling and emotions when being diagnosed with cancer, and the importance of support during treatment Informed patient of the support team roles and support services at [facility] Provided CSW contact information and encouraged patient to call with any questions or concerns Placed referral for LLS patient Aid program. Placed referral for [facility]. Placed referral for eligibility case worker to help with medicaid application.  Sent information for [employer] and Housing Studies and their housing list.    Follow Up Plan: Patient will contact CSW with any support or resource needs Patient verbalizes understanding of plan: Yes    Lizbeth Sprague, LCSW Clinical Social Worker [facility]

## 2024-11-06 NOTE — Progress Notes (Signed)
 Pt is approved for the $1000 [employer].

## 2024-11-06 NOTE — Telephone Encounter (Signed)
 [facility] CSW Progress Note  Patient and Medical Team have been assisting patient with obtaining prescriptions prior to 11/20 treatment appointment. Social Worker spoke with patient and patient's significant other on this date. CSW introduced herself and confirmed need for medications. Patient is scheduled for appt with CSW on this date for further assistance.    Lizbeth Sprague, LCSW Clinical Social Worker [facility]

## 2024-11-06 NOTE — Progress Notes (Signed)
 [facility] CSW Progress Note  Visual Merchandiser met with patient to follow-up on financial concerns.    Interventions: CSW met with pt to follow up on grant application.  Pt reports he is not working and unable to bring in any form of paycheck stubs.  Pt forwarded a screen shot of his [employer] account; unfortunately, this does not meet criteria for proof of income.  CSW spoke w/ pt regarding alternatives.  Pt's girlfriend was not present at the time of conversation; however, pt contacted his girlfriend to request she clinical research associate a letter verifying that she is financially responsible for pt at this time.  Pt's girlfriend emailed requested letter to pt who forwarded the letter to CSW.  The letter was not received in time to open the grant for the pharmacy.  Letter forwarded to primary CSW and financial resource specialist to follow up 11/19 in order for pt to be able to pick up his medication.        Follow Up Plan:  Primary CSW to follow up.    Devere JONELLE Manna, LCSW Clinical Social Worker [facility]

## 2024-11-07 ENCOUNTER — Inpatient Hospital Stay: Payer: Self-pay

## 2024-11-07 DIAGNOSIS — C8460 Anaplastic large cell lymphoma, ALK-positive, unspecified site: Secondary | ICD-10-CM

## 2024-11-07 MED ORDER — PEGFILGRASTIM-FPGK 6 MG/0.6ML ~~LOC~~ SOSY
6.0000 mg | PREFILLED_SYRINGE | Freq: Once | SUBCUTANEOUS | Status: AC
  Administered 2024-11-07: 6 mg via SUBCUTANEOUS
  Filled 2024-11-07: qty 0.6

## 2024-11-11 ENCOUNTER — Other Ambulatory Visit (HOSPITAL_COMMUNITY): Payer: Self-pay

## 2024-11-19 ENCOUNTER — Other Ambulatory Visit: Payer: Self-pay | Admitting: Hematology and Oncology

## 2024-11-19 DIAGNOSIS — C8448 Peripheral T-cell lymphoma, not classified, lymph nodes of multiple sites: Secondary | ICD-10-CM | POA: Insufficient documentation

## 2024-11-22 ENCOUNTER — Telehealth: Payer: Self-pay | Admitting: *Deleted

## 2024-11-22 MED FILL — Fosaprepitant Dimeglumine For IV Infusion 150 MG (Base Eq): INTRAVENOUS | Qty: 5 | Status: AC

## 2024-11-22 NOTE — Telephone Encounter (Signed)
 TCT patient SO, [name] as she had called and left a vm message to call as she states Russell [name] is feeling 'sick'. No answer and no vm available.  TCT patient on his mobile phone. C/o cough, runny nose, itchy throat. Though he states he went to his work Christmas party today. Denies fever/chills  Advised to treat symptoms-Robitussin for cough, Tylenol  for itchy throat and Flonase  for his runny nose (on his medlist). Advised to go to Urgent Care over the weekend if he develops a fever. He voiced understanding to the above. He is aware of his appts here on Monday, 11/25/24

## 2024-11-24 NOTE — Progress Notes (Unsigned)
 [facility]   [phone] [phone]    Patient Care Team: Patient, No Pcp Per as PCP - General (General Practice) Elana Montie CROME, RN as Oncology Nurse Navigator   CHIEF COMPLAINT: Follow up anaplastic T cell lymphoma  CURRENT THERAPY: CHOP  INTERVAL HISTORY Russell [name] returns for follow up and treatment, last seen 11/05/24 with cycle 1.   ROS   Past Medical History:  Diagnosis Date   Allergy    Anxiety    Asthma    Asthma 01/13/2023   Depression    Left elbow pain 01/13/2023   Elbow > shoulder Radiates to numbing sensation in pinky and ring finger. Not lifting heavy stuff   Obesity      Past Surgical History:  Procedure Laterality Date   IR IMAGING GUIDED PORT INSERTION  10/23/2024   TONSILLECTOMY     TYMPANOSTOMY TUBE PLACEMENT       Outpatient Encounter Medications as of 11/25/2024  Medication Sig   albuterol  (VENTOLIN  HFA) 108 (90 Base) MCG/ACT inhaler Inhale 1-2 puffs into the lungs every 6 (six) hours as needed.   allopurinol  (ZYLOPRIM ) 300 MG tablet Take 1 tablet (300 mg total) by mouth daily.   cetirizine  (ZYRTEC ) 10 MG tablet Take 1 tablet (10 mg total) by mouth daily.   doxycycline  (VIBRAMYCIN ) 100 MG capsule Take 1 capsule (100 mg total) by mouth 2 (two) times daily. (Patient not taking: Reported on 10/29/2024)   fluticasone  (FLONASE ) 50 MCG/ACT nasal spray Place 2 sprays into both nostrils daily.   lidocaine -prilocaine  (EMLA ) cream Apply topically as needed.   mometasone -formoterol  (DULERA ) 200-5 MCG/ACT AERO Inhale 2 puffs into the lungs 2 (two) times daily.   ondansetron  (ZOFRAN ) 8 MG tablet Take 1 tablet (8 mg total) by mouth every 8 (eight) hours as needed.   ondansetron  (ZOFRAN -ODT) 4 MG disintegrating tablet Take 1 tablet (4 mg total) by mouth every 8 (eight) hours as needed for nausea or vomiting. (Patient not taking: Reported on 10/29/2024)   oxyCODONE  (ROXICODONE ) 5 MG immediate release tablet Take 1  tablet (5 mg total) by mouth every 6 (six) hours as needed for severe pain (pain score 7-10). (Patient not taking: Reported on 10/29/2024)   predniSONE  (DELTASONE ) 20 MG tablet Take 3 tablets by mouth daily with breakfast on the first day of chemotherapy and continue for total of 5 days.  Repeat each cycle.   prochlorperazine  (COMPAZINE ) 10 MG tablet Take 1 tablet (10 mg total) by mouth every 6 (six) hours as needed for nausea or vomiting.   [DISCONTINUED] famotidine  (PEPCID ) 20 MG tablet Take 1 tablet (20 mg total) by mouth 2 (two) times daily.   [DISCONTINUED] Fluticasone -Salmeterol (ADVAIR DISKUS) 250-50 MCG/DOSE AEPB Inhale 1 puff into the lungs 2 (two) times daily.   [DISCONTINUED] montelukast  (SINGULAIR ) 10 MG tablet Take 1 tablet (10 mg total) by mouth at bedtime.   No facility-administered encounter medications on file as of 11/25/2024.     There were no vitals filed for this visit. There is no height or weight on file to calculate BMI.   ECOG PERFORMANCE STATUS: {CHL ONC ECOG PS:[id number]}  PHYSICAL EXAM GENERAL:alert, no distress and comfortable SKIN: no rash  EYES: sclera clear NECK: without mass LYMPH:  no palpable cervical or supraclavicular lymphadenopathy  LUNGS: clear with normal breathing effort HEART: regular rate & rhythm, no lower extremity edema ABDOMEN: abdomen soft, non-tender and normal bowel sounds NEURO: alert & oriented x 3 with fluent speech, no focal  motor/sensory deficits Breast exam:  PAC without erythema    CBC    Latest Ref Rng & Units 11/05/2024    1:17 PM 10/29/2024   10:13 AM 09/27/2024    3:43 PM  CBC  WBC 4.0 - 10.5 K/uL 10.3  11.1  12.8   Hemoglobin 13.0 - 17.0 g/dL 86.2  85.7  85.0   Hematocrit 39.0 - 52.0 % 38.6  40.1  41.1   Platelets 150 - 400 K/uL 299  263  309       CMP     Latest Ref Rng & Units 11/05/2024    1:17 PM 10/29/2024   10:13 AM 09/27/2024    3:43 PM  CMP  Glucose 70 - 99 mg/dL 92  897  73   BUN 6 - 20 mg/dL 9   11  13    Creatinine 0.61 - 1.24 mg/dL 9.13  9.19  9.04   Sodium 135 - 145 mmol/L 141  143  139   Potassium 3.5 - 5.1 mmol/L 3.5  3.8  3.8   Chloride 98 - 111 mmol/L 105  109  102   CO2 22 - 32 mmol/L 24  25  26    Calcium 8.9 - 10.3 mg/dL 9.4  9.6  9.6   Total Protein 6.5 - 8.1 g/dL 7.2  7.2  8.0   Total Bilirubin 0.0 - 1.2 mg/dL 0.8  0.7  1.6   Alkaline Phos 38 - 126 U/L 72  61  107   AST 15 - 41 U/L 13  10  31    ALT 0 - 44 U/L 10  9  23        ASSESSMENT & PLAN:  PLAN:  No orders of the defined types were placed in this encounter.     All questions were answered. The patient knows to call the clinic with any problems, questions or concerns. No barriers to learning were detected. I spent *** counseling the patient face to face. The total time spent in the appointment was *** and more than 50% was on counseling, review of test results, and coordination of care.   Dow Blahnik K Trinitee Horgan, NP 11/24/2024 8:02 PM

## 2024-11-25 ENCOUNTER — Inpatient Hospital Stay: Payer: Self-pay

## 2024-11-25 ENCOUNTER — Other Ambulatory Visit (HOSPITAL_COMMUNITY): Payer: Self-pay

## 2024-11-25 ENCOUNTER — Encounter: Payer: Self-pay | Admitting: Nurse Practitioner

## 2024-11-25 ENCOUNTER — Inpatient Hospital Stay: Payer: Self-pay | Attending: Physician Assistant | Admitting: Nurse Practitioner

## 2024-11-25 ENCOUNTER — Encounter: Payer: Self-pay | Admitting: Hematology and Oncology

## 2024-11-25 VITALS — BP 131/83 | HR 80 | Temp 98.9°F | Resp 17

## 2024-11-25 VITALS — BP 133/83 | HR 85 | Temp 97.8°F | Resp 17 | Wt 263.5 lb

## 2024-11-25 DIAGNOSIS — Z813 Family history of other psychoactive substance abuse and dependence: Secondary | ICD-10-CM | POA: Insufficient documentation

## 2024-11-25 DIAGNOSIS — C8468 Anaplastic large cell lymphoma, ALK-positive, lymph nodes of multiple sites: Secondary | ICD-10-CM | POA: Insufficient documentation

## 2024-11-25 DIAGNOSIS — Z810 Family history of intellectual disabilities: Secondary | ICD-10-CM | POA: Insufficient documentation

## 2024-11-25 DIAGNOSIS — Z83438 Family history of other disorder of lipoprotein metabolism and other lipidemia: Secondary | ICD-10-CM | POA: Insufficient documentation

## 2024-11-25 DIAGNOSIS — Z818 Family history of other mental and behavioral disorders: Secondary | ICD-10-CM | POA: Insufficient documentation

## 2024-11-25 DIAGNOSIS — C8460 Anaplastic large cell lymphoma, ALK-positive, unspecified site: Secondary | ICD-10-CM

## 2024-11-25 DIAGNOSIS — Z5111 Encounter for antineoplastic chemotherapy: Secondary | ICD-10-CM | POA: Insufficient documentation

## 2024-11-25 DIAGNOSIS — F1290 Cannabis use, unspecified, uncomplicated: Secondary | ICD-10-CM | POA: Insufficient documentation

## 2024-11-25 DIAGNOSIS — Z5989 Other problems related to housing and economic circumstances: Secondary | ICD-10-CM | POA: Insufficient documentation

## 2024-11-25 DIAGNOSIS — F17220 Nicotine dependence, chewing tobacco, uncomplicated: Secondary | ICD-10-CM | POA: Insufficient documentation

## 2024-11-25 DIAGNOSIS — Z8249 Family history of ischemic heart disease and other diseases of the circulatory system: Secondary | ICD-10-CM | POA: Insufficient documentation

## 2024-11-25 DIAGNOSIS — Z811 Family history of alcohol abuse and dependence: Secondary | ICD-10-CM | POA: Insufficient documentation

## 2024-11-25 DIAGNOSIS — Z808 Family history of malignant neoplasm of other organs or systems: Secondary | ICD-10-CM | POA: Insufficient documentation

## 2024-11-25 DIAGNOSIS — Z9089 Acquired absence of other organs: Secondary | ICD-10-CM | POA: Insufficient documentation

## 2024-11-25 DIAGNOSIS — Z823 Family history of stroke: Secondary | ICD-10-CM | POA: Insufficient documentation

## 2024-11-25 DIAGNOSIS — J45909 Unspecified asthma, uncomplicated: Secondary | ICD-10-CM | POA: Insufficient documentation

## 2024-11-25 DIAGNOSIS — F17290 Nicotine dependence, other tobacco product, uncomplicated: Secondary | ICD-10-CM | POA: Insufficient documentation

## 2024-11-25 DIAGNOSIS — Z5971 Insufficient health insurance coverage: Secondary | ICD-10-CM | POA: Insufficient documentation

## 2024-11-25 DIAGNOSIS — Z880 Allergy status to penicillin: Secondary | ICD-10-CM | POA: Insufficient documentation

## 2024-11-25 DIAGNOSIS — Z825 Family history of asthma and other chronic lower respiratory diseases: Secondary | ICD-10-CM | POA: Insufficient documentation

## 2024-11-25 DIAGNOSIS — Z9221 Personal history of antineoplastic chemotherapy: Secondary | ICD-10-CM | POA: Insufficient documentation

## 2024-11-25 DIAGNOSIS — Z79899 Other long term (current) drug therapy: Secondary | ICD-10-CM | POA: Insufficient documentation

## 2024-11-25 DIAGNOSIS — Z59868 Other specified financial insecurity: Secondary | ICD-10-CM | POA: Insufficient documentation

## 2024-11-25 DIAGNOSIS — F419 Anxiety disorder, unspecified: Secondary | ICD-10-CM | POA: Insufficient documentation

## 2024-11-25 DIAGNOSIS — Z5189 Encounter for other specified aftercare: Secondary | ICD-10-CM | POA: Insufficient documentation

## 2024-11-25 DIAGNOSIS — Z5982 Transportation insecurity: Secondary | ICD-10-CM | POA: Insufficient documentation

## 2024-11-25 LAB — CBC WITH DIFFERENTIAL (CANCER CENTER ONLY)
Abs Immature Granulocytes: 0.13 K/uL — ABNORMAL HIGH (ref 0.00–0.07)
Basophils Absolute: 0.1 K/uL (ref 0.0–0.1)
Basophils Relative: 2 %
Eosinophils Absolute: 0 K/uL (ref 0.0–0.5)
Eosinophils Relative: 0 %
HCT: 36.8 % — ABNORMAL LOW (ref 39.0–52.0)
Hemoglobin: 12.8 g/dL — ABNORMAL LOW (ref 13.0–17.0)
Immature Granulocytes: 2 %
Lymphocytes Relative: 22 %
Lymphs Abs: 1.3 K/uL (ref 0.7–4.0)
MCH: 27.3 pg (ref 26.0–34.0)
MCHC: 34.8 g/dL (ref 30.0–36.0)
MCV: 78.5 fL — ABNORMAL LOW (ref 80.0–100.0)
Monocytes Absolute: 0.5 K/uL (ref 0.1–1.0)
Monocytes Relative: 9 %
Neutro Abs: 3.9 K/uL (ref 1.7–7.7)
Neutrophils Relative %: 65 %
Platelet Count: 167 K/uL (ref 150–400)
RBC: 4.69 MIL/uL (ref 4.22–5.81)
RDW: 16.2 % — ABNORMAL HIGH (ref 11.5–15.5)
WBC Count: 6 K/uL (ref 4.0–10.5)
nRBC: 0 % (ref 0.0–0.2)

## 2024-11-25 LAB — CMP (CANCER CENTER ONLY)
ALT: 18 U/L (ref 0–44)
AST: 14 U/L — ABNORMAL LOW (ref 15–41)
Albumin: 4.4 g/dL (ref 3.5–5.0)
Alkaline Phosphatase: 49 U/L (ref 38–126)
Anion gap: 10 (ref 5–15)
BUN: 10 mg/dL (ref 6–20)
CO2: 24 mmol/L (ref 22–32)
Calcium: 9.2 mg/dL (ref 8.9–10.3)
Chloride: 108 mmol/L (ref 98–111)
Creatinine: 0.84 mg/dL (ref 0.61–1.24)
GFR, Estimated: >60 mL/min (ref >60)
Glucose, Bld: 95 mg/dL (ref 70–99)
Potassium: 3.5 mmol/L (ref 3.5–5.1)
Sodium: 142 mmol/L (ref 135–145)
Total Bilirubin: 0.4 mg/dL (ref 0.0–1.2)
Total Protein: 6.5 g/dL (ref 6.5–8.1)

## 2024-11-25 MED ORDER — SODIUM CHLORIDE 0.9 % IV SOLN
150.0000 mg | Freq: Once | INTRAVENOUS | Status: AC
  Administered 2024-11-25: 150 mg via INTRAVENOUS
  Filled 2024-11-25: qty 150

## 2024-11-25 MED ORDER — DEXAMETHASONE SOD PHOSPHATE PF 10 MG/ML IJ SOLN
10.0000 mg | Freq: Once | INTRAMUSCULAR | Status: AC
  Administered 2024-11-25: 10 mg via INTRAVENOUS

## 2024-11-25 MED ORDER — PALONOSETRON HCL INJECTION 0.25 MG/5ML
0.2500 mg | Freq: Once | INTRAVENOUS | Status: AC
  Administered 2024-11-25: 0.25 mg via INTRAVENOUS
  Filled 2024-11-25: qty 5

## 2024-11-25 MED ORDER — PREDNISONE 20 MG PO TABS
60.0000 mg | ORAL_TABLET | Freq: Every day | ORAL | 4 refills | Status: AC
  Filled 2024-11-25: qty 15, 5d supply, fill #0
  Filled 2024-12-16: qty 15, 5d supply, fill #1

## 2024-11-25 MED ORDER — SODIUM CHLORIDE 0.9 % IV SOLN: INTRAVENOUS | Status: DC

## 2024-11-25 MED ORDER — SODIUM CHLORIDE 0.9 % IV SOLN
750.0000 mg/m2 | Freq: Once | INTRAVENOUS | Status: AC
  Administered 2024-11-25: 2000 mg via INTRAVENOUS
  Filled 2024-11-25: qty 100

## 2024-11-25 MED ORDER — DOXORUBICIN HCL CHEMO IV INJECTION 2 MG/ML
50.0000 mg/m2 | Freq: Once | INTRAVENOUS | Status: AC
  Administered 2024-11-25: 126 mg via INTRAVENOUS
  Filled 2024-11-25: qty 63

## 2024-11-25 MED ORDER — VINCRISTINE SULFATE CHEMO INJECTION 1 MG/ML
2.0000 mg | Freq: Once | INTRAVENOUS | Status: AC
  Administered 2024-11-25: 2 mg via INTRAVENOUS
  Filled 2024-11-25: qty 2

## 2024-11-25 MED ORDER — SODIUM CHLORIDE 0.9% FLUSH: 10.0000 mL | INTRAVENOUS | Status: DC | PRN

## 2024-11-25 NOTE — Patient Instructions (Signed)
 [facility] - A DEPT OF [facility]  Discharge Instructions: Thank you for choosing [facility] to provide your oncology and hematology care.   If you have a lab appointment with the Cancer Center, please go directly to the Cancer Center and check in at the registration area.   Wear comfortable clothing and clothing appropriate for easy access to any Portacath or PICC line.   We strive to give you quality time with your provider. You may need to reschedule your appointment if you arrive late (15 or more minutes).  Arriving late affects you and other patients whose appointments are after yours.  Also, if you miss three or more appointments without notifying the office, you may be dismissed from the clinic at the provider's discretion.      For prescription refill requests, have your pharmacy contact our office and allow 72 hours for refills to be completed.    Today you received the following chemotherapy and/or immunotherapy agents: Doxorubicin (Adriamycin), Vincristine (Oncovin), & Cyclophosphamide (Cytoxan)    To help prevent nausea and vomiting after your treatment, we encourage you to take your nausea medication as directed.  BELOW ARE SYMPTOMS THAT SHOULD BE REPORTED IMMEDIATELY: *FEVER GREATER THAN 100.4 F (38 C) OR HIGHER *CHILLS OR SWEATING *NAUSEA AND VOMITING THAT IS NOT CONTROLLED WITH YOUR NAUSEA MEDICATION *UNUSUAL SHORTNESS OF BREATH *UNUSUAL BRUISING OR BLEEDING *URINARY PROBLEMS (pain or burning when urinating, or frequent urination) *BOWEL PROBLEMS (unusual diarrhea, constipation, pain near the anus) TENDERNESS IN MOUTH AND THROAT WITH OR WITHOUT PRESENCE OF ULCERS (sore throat, sores in mouth, or a toothache) UNUSUAL RASH, SWELLING OR PAIN  UNUSUAL VAGINAL DISCHARGE OR ITCHING   Items with * indicate a potential emergency and should be followed up as soon as possible or go to the Emergency Department if any problems should  occur.  Please show the CHEMOTHERAPY ALERT CARD or IMMUNOTHERAPY ALERT CARD at check-in to the Emergency Department and triage nurse.  Should you have questions after your visit or need to cancel or reschedule your appointment, please contact [facility]  Dept: [phone]  and follow the prompts.  Office hours are 8:00 a.m. to 4:30 p.m. Monday - Friday. Please note that voicemails left after 4:00 p.m. may not be returned until the following business day.  We are closed weekends and major holidays. You have access to a nurse at all times for urgent questions. Please call the main number to the clinic Dept: [phone] and follow the prompts.   For any non-urgent questions, you may also contact your provider using MyChart. We now offer e-Visits for anyone 45 and older to request care online for non-urgent symptoms. For details visit [email or web address].   Also download the MyChart app! Go to the app store, search MyChart, open the app, select , and log in with your MyChart username and password.

## 2024-11-26 ENCOUNTER — Encounter: Payer: Self-pay | Admitting: Hematology and Oncology

## 2024-11-26 ENCOUNTER — Other Ambulatory Visit (HOSPITAL_COMMUNITY): Payer: Self-pay

## 2024-11-26 ENCOUNTER — Telehealth: Payer: Self-pay

## 2024-11-26 NOTE — Telephone Encounter (Signed)
 Returned TC to [employer], Pt's S.O., who reported  the pt forgot to pick up his Prednisone  prescription at [facility] outpatient pharmacy  yesterday after infusion and wants it delivered.  Checked with [facility] and was advised they could not get it to him until possibly Thursday with the request being initiated today. Advised Ms Lanis that they will need to pick up  the Prednisone  today as it is important to take his meds as directed. Advised they can make arrangements for delivery for future needs.  Ms Lanis voiced understanding of this information. She verbalized she would tell the pt when he woke up. Confirmed access to transportation.

## 2024-11-27 ENCOUNTER — Inpatient Hospital Stay: Payer: Self-pay

## 2024-11-27 VITALS — BP 145/90 | HR 79 | Temp 98.4°F | Resp 17

## 2024-11-27 DIAGNOSIS — C8460 Anaplastic large cell lymphoma, ALK-positive, unspecified site: Secondary | ICD-10-CM

## 2024-11-27 MED ORDER — PEGFILGRASTIM-FPGK 6 MG/0.6ML ~~LOC~~ SOSY
6.0000 mg | PREFILLED_SYRINGE | Freq: Once | SUBCUTANEOUS | Status: AC
  Administered 2024-11-27: 6 mg via SUBCUTANEOUS
  Filled 2024-11-27: qty 0.6

## 2024-12-06 ENCOUNTER — Other Ambulatory Visit: Payer: Self-pay

## 2024-12-11 ENCOUNTER — Encounter: Payer: Self-pay | Admitting: Hematology and Oncology

## 2024-12-13 MED FILL — Fosaprepitant Dimeglumine For IV Infusion 150 MG (Base Eq): INTRAVENOUS | Qty: 5 | Status: AC

## 2024-12-15 NOTE — Progress Notes (Unsigned)
 " [facility] [phone]   [phone]  PROGRESS NOTE  Patient Care Team: Patient, No Pcp Per as PCP - General (General Practice) Elana Montie CROME, RN as Oncology Nurse Navigator  Hematological/Oncological History # Anaplastic T Cell Lymphoma, Alk Positive. Stage IV  09/26/2024: CT scan performed for groin swelling shows extensive adenopathy in the right inguinal, iliac, and in the peri-aortic regions 09/27/2024: Establish care with the rapid diagnostic clinic. 10/10/2024: US -guided core biopsy showed ALK positive anaplastic large cell lymphoma 10/17/2024: PET CT scan shows extensive abdominal lymphadenopathy with axillary lymph node involvement as well.  Confirms at least stage III disease. 11/05/2024: Cycle 1 Day 1 of CHOP (Brentixumab to be administered once patient has Medicaid)  Interval History:  Russell [name] 33 y.o. male with medical history significant for newly diagnosed Anaplastic T Cell lymphoma (ALK positive) who presents for a follow up visit. The patient's last visit was on 11/25/2024. In the interim since the last visit he completed chemotherapy education and presents today for cycle 3 of treatment.  On exam today Mr. Dorris reports he has been well overall and tolerated cycle 2 chemotherapy well.  He does have some nausea but no vomiting.  He is accompanied by his wife today.  He reports that he does have some occasional upset stomach with diarrhea the last 2 to 3 days.  He reports he has been eating well and his weight has actually been increasing, up to 270 pounds from 251 pounds in November.  He reports that he is enjoying his holiday so far.  He is not having any numbness or tingling of his fingers and toes but occasionally does have pulses of pain which feels like his bones are stretching.  He reports that he is also sleeping poorly only about 3 to 4 hours per night.  He otherwise denies any fevers, chills, sweats.  A full 10 point ROS is  otherwise negative.  MEDICAL HISTORY:  Past Medical History:  Diagnosis Date   Allergy    Anxiety    Asthma    Asthma 01/13/2023   Depression    Left elbow pain 01/13/2023   Elbow > shoulder Radiates to numbing sensation in pinky and ring finger. Not lifting heavy stuff   Obesity     SURGICAL HISTORY: Past Surgical History:  Procedure Laterality Date   IR IMAGING GUIDED PORT INSERTION  10/23/2024   TONSILLECTOMY     TYMPANOSTOMY TUBE PLACEMENT      SOCIAL HISTORY: Social History   Socioeconomic History   Marital status: Significant Other    Spouse name: Not on file   Number of children: Not on file   Years of education: Not on file   Highest education level: Not on file  Occupational History   Not on file  Tobacco Use   Smoking status: Never   Smokeless tobacco: Current    Types: Chew  Vaping Use   Vaping status: Some Days  Substance and Sexual Activity   Alcohol use: Not Currently   Drug use: Yes    Frequency: 7.0 times per week    Types: Marijuana    Comment: smoke marijuana   Sexual activity: Yes  Other Topics Concern   Not on file  Social History Narrative   Not on file   Social Drivers of Health   Tobacco Use: High Risk (11/25/2024)   Patient History    Smoking Tobacco Use: Never    Smokeless Tobacco Use: Current  Passive Exposure: Not on file  Financial Resource Strain: High Risk (11/06/2024)   Overall Financial Resource Strain (CARDIA)    Difficulty of Paying Living Expenses: Hard  Food Insecurity: Food Insecurity Present (10/29/2024)   Epic    Worried About Programme Researcher, Broadcasting/film/video in the Last Year: Sometimes true    Barista in the Last Year: Sometimes true  Transportation Needs: Unmet Transportation Needs (11/06/2024)   Epic    Lack of Transportation (Medical): Yes    Lack of Transportation (Non-Medical): Yes  Physical Activity: Not on file  Stress: Not on file  Social Connections: Not on file  Intimate Partner Violence: Not At  Risk (11/06/2024)   Epic    Fear of Current or Ex-Partner: No    Emotionally Abused: No    Physically Abused: No    Sexually Abused: No  Depression (PHQ2-9): Low Risk (12/16/2024)   Depression (PHQ2-9)    PHQ-2 Score: 0  Alcohol Screen: Not on file  Housing: High Risk (11/06/2024)   Epic    Unable to Pay for Housing in the Last Year: Yes    Number of Times Moved in the Last Year: 0    Homeless in the Last Year: No  Utilities: At Risk (11/06/2024)   Epic    Threatened with loss of utilities: Yes  Health Literacy: Not on file    FAMILY HISTORY: Family History  Problem Relation Age of Onset   Hyperlipidemia Mother    Early death Mother    Hypertension Mother    Mental illness Mother    Alcohol abuse Father    Asthma Father    COPD Father    Drug abuse Father    Early death Father    Hypertension Father    Hyperlipidemia Father    Stroke Father    Heart attack Father    Cancer Maternal Grandmother    Heart disease Maternal Grandfather    Skin cancer Maternal Grandfather    Mental retardation Maternal Uncle     ALLERGIES:  is allergic to penicillins.  MEDICATIONS:  Current Outpatient Medications  Medication Sig Dispense Refill   albuterol  (VENTOLIN  HFA) 108 (90 Base) MCG/ACT inhaler Inhale 1-2 puffs into the lungs every 6 (six) hours as needed. 18 g 2   allopurinol  (ZYLOPRIM ) 300 MG tablet Take 1 tablet (300 mg total) by mouth daily. 90 tablet 1   cetirizine  (ZYRTEC ) 10 MG tablet Take 1 tablet (10 mg total) by mouth daily. 30 tablet 1   fluticasone  (FLONASE ) 50 MCG/ACT nasal spray Place 2 sprays into both nostrils daily. 16 g 0   lidocaine -prilocaine  (EMLA ) cream Apply topically as needed. 30 g 0   mometasone -formoterol  (DULERA ) 200-5 MCG/ACT AERO Inhale 2 puffs into the lungs 2 (two) times daily. 13 g 0   ondansetron  (ZOFRAN ) 8 MG tablet Take 1 tablet (8 mg total) by mouth every 8 (eight) hours as needed. 30 tablet 2   ondansetron  (ZOFRAN -ODT) 4 MG disintegrating  tablet Take 1 tablet (4 mg total) by mouth every 8 (eight) hours as needed for nausea or vomiting. (Patient not taking: Reported on 10/29/2024) 20 tablet 0   oxyCODONE  (ROXICODONE ) 5 MG immediate release tablet Take 1 tablet (5 mg total) by mouth every 6 (six) hours as needed for severe pain (pain score 7-10). (Patient not taking: Reported on 10/29/2024) 10 tablet 0   predniSONE  (DELTASONE ) 20 MG tablet Take 3 tablets by mouth daily with breakfast on the first day of chemotherapy and continue  for total of 5 days.  Repeat each cycle. 15 tablet 4   prochlorperazine  (COMPAZINE ) 10 MG tablet Take 1 tablet (10 mg total) by mouth every 6 (six) hours as needed for nausea or vomiting. 30 tablet 2   No current facility-administered medications for this visit.   Facility-Administered Medications Ordered in Other Visits  Medication Dose Route Frequency Provider Last Rate Last Admin   0.9 %  sodium chloride  infusion   Intravenous Continuous Federico Norleen ONEIDA MADISON, MD 10 mL/hr at 12/16/24 1141 New Bag at 12/16/24 1141   cyclophosphamide  (CYTOXAN ) 2,000 mg in sodium chloride  0.9 % 500 mL chemo infusion  750 mg/m2 (Treatment Plan Recorded) Intravenous Once [name] T IV, MD 600 mL/hr at 12/16/24 1323 2,000 mg at 12/16/24 1323    REVIEW OF SYSTEMS:   Constitutional: ( - ) fevers, ( - )  chills , ( - ) night sweats Eyes: ( - ) blurriness of vision, ( - ) double vision, ( - ) watery eyes Ears, nose, mouth, throat, and face: ( - ) mucositis, ( - ) sore throat Respiratory: ( - ) cough, ( - ) dyspnea, ( - ) wheezes Cardiovascular: ( - ) palpitation, ( - ) chest discomfort, ( - ) lower extremity swelling Gastrointestinal:  ( - ) nausea, ( - ) heartburn, ( - ) change in bowel habits Skin: ( - ) abnormal skin rashes Lymphatics: ( - ) new lymphadenopathy, ( - ) easy bruising Neurological: ( - ) numbness, ( - ) tingling, ( - ) new weaknesses Behavioral/Psych: ( - ) mood change, ( - ) new changes  All other systems were  reviewed with the patient and are negative.  PHYSICAL EXAMINATION: ECOG PERFORMANCE STATUS: 1 - Symptomatic but completely ambulatory  Vitals:   12/16/24 1100  BP: 130/84  Pulse: 92  Resp: 17  Temp: 97.9 F (36.6 C)  SpO2: 100%     Filed Weights   12/16/24 1100  Weight: 270 lb 11.2 oz (122.8 kg)      GENERAL: Well-appearing middle-age Caucasian male, alert, no distress and comfortable SKIN: skin color, texture, turgor are normal, no rashes or significant lesions EYES: conjunctiva are pink and non-injected, sclera clear LUNGS: clear to auscultation and percussion with normal breathing effort HEART: regular rate & rhythm and no murmurs and no lower extremity edema Musculoskeletal: no cyanosis of digits and no clubbing  PSYCH: alert & oriented x 3, fluent speech NEURO: no focal motor/sensory deficits  LABORATORY DATA:  I have reviewed the data as listed    Latest Ref Rng & Units 12/16/2024   10:42 AM 11/25/2024    8:51 AM 11/05/2024    1:17 PM  CBC  WBC 4.0 - 10.5 K/uL 6.3  6.0  10.3   Hemoglobin 13.0 - 17.0 g/dL 86.3  87.1  86.2   Hematocrit 39.0 - 52.0 % 38.2  36.8  38.6   Platelets 150 - 400 K/uL 209  167  299        Latest Ref Rng & Units 12/16/2024   10:42 AM 11/25/2024    8:51 AM 11/05/2024    1:17 PM  CMP  Glucose 70 - 99 mg/dL 895  95  92   BUN 6 - 20 mg/dL 12  10  9    Creatinine 0.61 - 1.24 mg/dL 9.04  9.15  9.13   Sodium 135 - 145 mmol/L 142  142  141   Potassium 3.5 - 5.1 mmol/L 3.8  3.5  3.5  Chloride 98 - 111 mmol/L 105  108  105   CO2 22 - 32 mmol/L 25  24  24    Calcium 8.9 - 10.3 mg/dL 89.9  9.2  9.4   Total Protein 6.5 - 8.1 g/dL 6.8  6.5  7.2   Total Bilirubin 0.0 - 1.2 mg/dL 0.8  0.4  0.8   Alkaline Phos 38 - 126 U/L 50  49  72   AST 15 - 41 U/L 20  14  13    ALT 0 - 44 U/L 26  18  10      RADIOGRAPHIC STUDIES: No results found.   ASSESSMENT & PLAN EMAN MORIMOTO 33 y.o. male with medical history significant for newly diagnosed  Anaplastic T Cell lymphoma (ALK positive) who presents for a follow up visit.  # Anaplastic T Cell Lymphoma, Alk Positive. Stage III  -- Diagnosis confirmed based on biopsy, PET CT scan confirms at least stage III disease -- Biopsy confirms anaplastic T-cell lymphoma with ALK positivity --Will plan to proceed with brentuximab CHP treatment.  This will be administered every 3 weeks for total of 6 cycles unfortunately patient does not have insurance and we were unable to provide brentuximab treatment.  Instead we will do CHOP chemotherapy until we can administer rituximab. -- Will plan for interval PET CT scan after cycle 3 -- White blood cell 6.3, hemoglobin 13.6, MCV 78.6, platelets 209.  LFTs and creatinine within normal limits. -- Return to clinic cycle 2 of treatment with interval G-CSF shot.  #Supportive Care -- chemotherapy education complete -- port placed -- zofran  8mg  q8H PRN and compazine  10mg  PO q6H for nausea -- allopurinol  300mg  PO daily for TLS prophylaxis -- EMLA  cream for port -- no pain medication required at this time.    Orders Placed This Encounter  Procedures   NM PET Image Restag (PS) Skull Base To Thigh    Standing Status:   Future    Expected Date:   12/30/2024    Expiration Date:   12/16/2025    If indicated for the ordered procedure, I authorize the administration of a radiopharmaceutical per Radiology protocol:   Yes    Preferred imaging location?:   [place]   CBC with Differential (Cancer Center Only)    Standing Status:   Future    Expected Date:   01/28/2025    Expiration Date:   01/28/2026   CMP (Cancer Center only)    Standing Status:   Future    Expected Date:   01/28/2025    Expiration Date:   01/28/2026   CBC with Differential (Cancer Center Only)    Standing Status:   Future    Expected Date:   02/18/2025    Expiration Date:   02/18/2026   CMP (Cancer Center only)    Standing Status:   Future    Expected Date:   02/18/2025    Expiration Date:    02/18/2026    All questions were answered. The patient knows to call the clinic with any problems, questions or concerns.  A total of more than 30 minutes were spent on this encounter with face-to-face time and non-face-to-face time, including preparing to see the patient, ordering tests and/or medications, counseling the patient and coordination of care as outlined above.   Norleen IVAR Kidney, MD Department of Hematology/Oncology [facility] Phone: [phone] Pager: [phone] Email: [email or web address]  12/16/2024 2:13 PM  "

## 2024-12-16 ENCOUNTER — Inpatient Hospital Stay: Payer: Self-pay

## 2024-12-16 ENCOUNTER — Other Ambulatory Visit: Payer: Self-pay | Admitting: Hematology and Oncology

## 2024-12-16 ENCOUNTER — Other Ambulatory Visit: Payer: Self-pay | Admitting: *Deleted

## 2024-12-16 ENCOUNTER — Encounter: Payer: Self-pay | Admitting: Hematology and Oncology

## 2024-12-16 ENCOUNTER — Encounter: Payer: Self-pay | Admitting: *Deleted

## 2024-12-16 ENCOUNTER — Other Ambulatory Visit (HOSPITAL_COMMUNITY): Payer: Self-pay

## 2024-12-16 ENCOUNTER — Ambulatory Visit: Payer: Self-pay | Admitting: Family Medicine

## 2024-12-16 ENCOUNTER — Inpatient Hospital Stay: Payer: Self-pay | Admitting: Hematology and Oncology

## 2024-12-16 VITALS — BP 130/84 | HR 92 | Temp 97.9°F | Resp 17 | Wt 270.7 lb

## 2024-12-16 DIAGNOSIS — R591 Generalized enlarged lymph nodes: Secondary | ICD-10-CM

## 2024-12-16 DIAGNOSIS — C8586 Other specified types of non-Hodgkin lymphoma, intrapelvic lymph nodes: Secondary | ICD-10-CM

## 2024-12-16 DIAGNOSIS — C8460 Anaplastic large cell lymphoma, ALK-positive, unspecified site: Secondary | ICD-10-CM

## 2024-12-16 DIAGNOSIS — C8448 Peripheral T-cell lymphoma, not classified, lymph nodes of multiple sites: Secondary | ICD-10-CM

## 2024-12-16 DIAGNOSIS — J4541 Moderate persistent asthma with (acute) exacerbation: Secondary | ICD-10-CM

## 2024-12-16 LAB — CBC WITH DIFFERENTIAL (CANCER CENTER ONLY)
Abs Immature Granulocytes: 0.07 K/uL (ref 0.00–0.07)
Basophils Absolute: 0.1 K/uL (ref 0.0–0.1)
Basophils Relative: 1 %
Eosinophils Absolute: 0 K/uL (ref 0.0–0.5)
Eosinophils Relative: 0 %
HCT: 38.2 % — ABNORMAL LOW (ref 39.0–52.0)
Hemoglobin: 13.6 g/dL (ref 13.0–17.0)
Immature Granulocytes: 1 %
Lymphocytes Relative: 14 %
Lymphs Abs: 0.9 K/uL (ref 0.7–4.0)
MCH: 28 pg (ref 26.0–34.0)
MCHC: 35.6 g/dL (ref 30.0–36.0)
MCV: 78.6 fL — ABNORMAL LOW (ref 80.0–100.0)
Monocytes Absolute: 0.6 K/uL (ref 0.1–1.0)
Monocytes Relative: 10 %
Neutro Abs: 4.7 K/uL (ref 1.7–7.7)
Neutrophils Relative %: 74 %
Platelet Count: 209 K/uL (ref 150–400)
RBC: 4.86 MIL/uL (ref 4.22–5.81)
RDW: 17.8 % — ABNORMAL HIGH (ref 11.5–15.5)
WBC Count: 6.3 K/uL (ref 4.0–10.5)
nRBC: 0 % (ref 0.0–0.2)

## 2024-12-16 LAB — CMP (CANCER CENTER ONLY)
ALT: 26 U/L (ref 0–44)
AST: 20 U/L (ref 15–41)
Albumin: 4.6 g/dL (ref 3.5–5.0)
Alkaline Phosphatase: 50 U/L (ref 38–126)
Anion gap: 11 (ref 5–15)
BUN: 12 mg/dL (ref 6–20)
CO2: 25 mmol/L (ref 22–32)
Calcium: 10 mg/dL (ref 8.9–10.3)
Chloride: 105 mmol/L (ref 98–111)
Creatinine: 0.95 mg/dL (ref 0.61–1.24)
GFR, Estimated: >60 mL/min
Glucose, Bld: 104 mg/dL — ABNORMAL HIGH (ref 70–99)
Potassium: 3.8 mmol/L (ref 3.5–5.1)
Sodium: 142 mmol/L (ref 135–145)
Total Bilirubin: 0.8 mg/dL (ref 0.0–1.2)
Total Protein: 6.8 g/dL (ref 6.5–8.1)

## 2024-12-16 MED ORDER — VINCRISTINE SULFATE CHEMO INJECTION 1 MG/ML
2.0000 mg | Freq: Once | INTRAVENOUS | Status: AC
  Administered 2024-12-16: 2 mg via INTRAVENOUS
  Filled 2024-12-16: qty 2

## 2024-12-16 MED ORDER — SODIUM CHLORIDE 0.9 % IV SOLN
750.0000 mg/m2 | Freq: Once | INTRAVENOUS | Status: AC
  Administered 2024-12-16: 2000 mg via INTRAVENOUS
  Filled 2024-12-16: qty 100

## 2024-12-16 MED ORDER — PALONOSETRON HCL INJECTION 0.25 MG/5ML
0.2500 mg | Freq: Once | INTRAVENOUS | Status: AC
  Administered 2024-12-16: 0.25 mg via INTRAVENOUS
  Filled 2024-12-16: qty 5

## 2024-12-16 MED ORDER — ALBUTEROL SULFATE HFA 108 (90 BASE) MCG/ACT IN AERS
1.0000 | INHALATION_SPRAY | Freq: Four times a day (QID) | RESPIRATORY_TRACT | 2 refills | Status: AC | PRN
  Filled 2024-12-16: qty 6.7, 25d supply, fill #0
  Filled 2024-12-16: qty 18, fill #0

## 2024-12-16 MED ORDER — DOXORUBICIN HCL CHEMO IV INJECTION 2 MG/ML
50.0000 mg/m2 | Freq: Once | INTRAVENOUS | Status: AC
  Administered 2024-12-16: 126 mg via INTRAVENOUS
  Filled 2024-12-16: qty 63

## 2024-12-16 MED ORDER — SODIUM CHLORIDE 0.9 % IV SOLN: INTRAVENOUS | Status: DC

## 2024-12-16 MED ORDER — DEXAMETHASONE SOD PHOSPHATE PF 10 MG/ML IJ SOLN
10.0000 mg | Freq: Once | INTRAMUSCULAR | Status: AC
  Administered 2024-12-16: 10 mg via INTRAVENOUS

## 2024-12-16 MED ORDER — ONDANSETRON HCL 8 MG PO TABS
8.0000 mg | ORAL_TABLET | Freq: Three times a day (TID) | ORAL | 2 refills | Status: AC | PRN
  Filled 2024-12-16: qty 30, 10d supply, fill #0

## 2024-12-16 MED ORDER — SODIUM CHLORIDE 0.9 % IV SOLN
150.0000 mg | Freq: Once | INTRAVENOUS | Status: AC
  Administered 2024-12-16: 150 mg via INTRAVENOUS
  Filled 2024-12-16: qty 150

## 2024-12-16 MED ORDER — PROCHLORPERAZINE MALEATE 10 MG PO TABS
10.0000 mg | ORAL_TABLET | Freq: Four times a day (QID) | ORAL | 2 refills | Status: AC | PRN
  Filled 2024-12-16: qty 30, 8d supply, fill #0

## 2024-12-16 NOTE — Patient Instructions (Signed)
 [facility] - A DEPT OF [facility]  Discharge Instructions: Thank you for choosing [facility] to provide your oncology and hematology care.   If you have a lab appointment with the Cancer Center, please go directly to the Cancer Center and check in at the registration area.   Wear comfortable clothing and clothing appropriate for easy access to any Portacath or PICC line.   We strive to give you quality time with your provider. You may need to reschedule your appointment if you arrive late (15 or more minutes).  Arriving late affects you and other patients whose appointments are after yours.  Also, if you miss three or more appointments without notifying the office, you may be dismissed from the clinic at the provider's discretion.      For prescription refill requests, have your pharmacy contact our office and allow 72 hours for refills to be completed.    Today you received the following chemotherapy and/or immunotherapy agents: Doxorubicin (Adriamycin), Vincristine (Oncovin), & Cyclophosphamide (Cytoxan)    To help prevent nausea and vomiting after your treatment, we encourage you to take your nausea medication as directed.  BELOW ARE SYMPTOMS THAT SHOULD BE REPORTED IMMEDIATELY: *FEVER GREATER THAN 100.4 F (38 C) OR HIGHER *CHILLS OR SWEATING *NAUSEA AND VOMITING THAT IS NOT CONTROLLED WITH YOUR NAUSEA MEDICATION *UNUSUAL SHORTNESS OF BREATH *UNUSUAL BRUISING OR BLEEDING *URINARY PROBLEMS (pain or burning when urinating, or frequent urination) *BOWEL PROBLEMS (unusual diarrhea, constipation, pain near the anus) TENDERNESS IN MOUTH AND THROAT WITH OR WITHOUT PRESENCE OF ULCERS (sore throat, sores in mouth, or a toothache) UNUSUAL RASH, SWELLING OR PAIN  UNUSUAL VAGINAL DISCHARGE OR ITCHING   Items with * indicate a potential emergency and should be followed up as soon as possible or go to the Emergency Department if any problems should  occur.  Please show the CHEMOTHERAPY ALERT CARD or IMMUNOTHERAPY ALERT CARD at check-in to the Emergency Department and triage nurse.  Should you have questions after your visit or need to cancel or reschedule your appointment, please contact [facility]  Dept: [phone]  and follow the prompts.  Office hours are 8:00 a.m. to 4:30 p.m. Monday - Friday. Please note that voicemails left after 4:00 p.m. may not be returned until the following business day.  We are closed weekends and major holidays. You have access to a nurse at all times for urgent questions. Please call the main number to the clinic Dept: [phone] and follow the prompts.   For any non-urgent questions, you may also contact your provider using MyChart. We now offer e-Visits for anyone 45 and older to request care online for non-urgent symptoms. For details visit [email or web address].   Also download the MyChart app! Go to the app store, search MyChart, open the app, select , and log in with your MyChart username and password.

## 2024-12-17 ENCOUNTER — Inpatient Hospital Stay: Payer: Self-pay

## 2024-12-18 ENCOUNTER — Inpatient Hospital Stay: Payer: Self-pay

## 2024-12-18 VITALS — BP 124/79 | HR 82 | Temp 98.1°F | Resp 18

## 2024-12-18 DIAGNOSIS — C8460 Anaplastic large cell lymphoma, ALK-positive, unspecified site: Secondary | ICD-10-CM

## 2024-12-18 MED ORDER — PEGFILGRASTIM-FPGK 6 MG/0.6ML ~~LOC~~ SOSY
6.0000 mg | PREFILLED_SYRINGE | Freq: Once | SUBCUTANEOUS | Status: AC
  Administered 2024-12-18: 6 mg via SUBCUTANEOUS
  Filled 2024-12-18: qty 0.6

## 2024-12-25 ENCOUNTER — Telehealth: Payer: Self-pay | Admitting: *Deleted

## 2024-12-25 NOTE — Telephone Encounter (Signed)
 Received vm message from pt requesting a call back. TCT patient and spoke with him. He states he and his family are re-location to [place], [place] and will need a referral for another oncologist. Pt is currently undergoing treatment for T-Cell Lymphoma. He will scheduled for  a PET scan on 12/30/24 but will be moving after that. He will not be here for his next scheduled treatment on 01/06/25.  Spoke with Dr. Federico. Referral will be made to [facility]. Referral fax'd to [phone] Pt aware of this. The phone # to that facility was provided [phone]

## 2024-12-30 ENCOUNTER — Ambulatory Visit (HOSPITAL_COMMUNITY)
Admission: RE | Admit: 2024-12-30 | Discharge: 2024-12-30 | Disposition: A | Discharge status: Home / Self Care | Payer: Self-pay | Source: Ambulatory Visit | Attending: Hematology and Oncology | Admitting: Hematology and Oncology

## 2024-12-30 DIAGNOSIS — C8460 Anaplastic large cell lymphoma, ALK-positive, unspecified site: Secondary | ICD-10-CM | POA: Insufficient documentation

## 2024-12-30 LAB — GLUCOSE, CAPILLARY: Glucose-Capillary: 92 mg/dL (ref 70–99)

## 2024-12-30 MED ORDER — FLUDEOXYGLUCOSE F - 18 (FDG) INJECTION
13.3600 | Freq: Once | INTRAVENOUS | Status: AC
  Administered 2024-12-30: 13.36 via INTRAVENOUS

## 2025-01-01 ENCOUNTER — Telehealth: Payer: Self-pay | Admitting: *Deleted

## 2025-01-01 NOTE — Telephone Encounter (Signed)
 Fax'd scan results to [facility] @ [facility]. Referral had been fax'd last week as pt is moving to that area.

## 2025-01-01 NOTE — Telephone Encounter (Signed)
 TCT patient regarding recent PET scan results. Spoke with his SO. Advised that his scan looks excellent. He is responding extremely well to therapy. He does need to complete the full 6 cycles though.  She states they will be leaving for [place]  tonight-they are finishing up packing up all their belongings. She states they have not heard from the Cancer Center in [place] . Confirmed with her that she has that phone # to call once they get up to [place] . He is due for his next treatment on 01/06/25 Fax'd recent scan results to

## 2025-01-02 ENCOUNTER — Telehealth: Payer: Self-pay

## 2025-01-02 ENCOUNTER — Other Ambulatory Visit: Payer: Self-pay

## 2025-01-02 NOTE — Telephone Encounter (Signed)
 Spoke with pt who stated he had called [facility] @ [place] to make an appt since he is moving and his care is being transferred. Pt reported they did not make an appt for him & told him they have not rec'd transfer of records. Records were faxed 12/25/24 and scan results faxed 01/01/25. TC to [facility] and left a message with medical records to return call to [phone] regarding the transfer of pt's records. Pt updated and agreed to call our office if he calls the [facility] again and is unable to make an appointment.

## 2025-01-03 NOTE — Progress Notes (Signed)
 Per encounter on 01/02/25, patient is moving to [facility], [place]. Records have been faxed to new oncology center.

## 2025-01-06 ENCOUNTER — Inpatient Hospital Stay: Payer: Self-pay

## 2025-01-06 ENCOUNTER — Inpatient Hospital Stay: Payer: Self-pay | Admitting: Hematology and Oncology

## 2025-01-08 ENCOUNTER — Inpatient Hospital Stay: Payer: Self-pay

## 2025-01-21 ENCOUNTER — Ambulatory Visit: Payer: Self-pay | Admitting: Family Medicine

## 2025-01-27 ENCOUNTER — Inpatient Hospital Stay: Payer: Self-pay

## 2025-01-27 ENCOUNTER — Inpatient Hospital Stay: Payer: Self-pay | Admitting: Hematology and Oncology

## 2025-01-29 ENCOUNTER — Inpatient Hospital Stay: Payer: Self-pay

## 2025-02-17 ENCOUNTER — Inpatient Hospital Stay: Payer: Self-pay

## 2025-02-17 ENCOUNTER — Inpatient Hospital Stay: Payer: Self-pay | Admitting: Hematology and Oncology

## 2025-02-20 ENCOUNTER — Inpatient Hospital Stay: Payer: Self-pay
